# Patient Record
Sex: Female | Born: 1960 | Race: Black or African American | Hispanic: No | Marital: Single | Smoking: Current every day smoker
Health system: Southern US, Community
[De-identification: ages and names within clinical notes are randomized; demographics above are authoritative.]

## PROBLEM LIST (undated history)

## (undated) DIAGNOSIS — C801 Malignant (primary) neoplasm, unspecified: Secondary | ICD-10-CM

## (undated) DIAGNOSIS — K649 Unspecified hemorrhoids: Secondary | ICD-10-CM

## (undated) DIAGNOSIS — I10 Essential (primary) hypertension: Secondary | ICD-10-CM

## (undated) DIAGNOSIS — B20 Human immunodeficiency virus [HIV] disease: Secondary | ICD-10-CM

## (undated) HISTORY — PX: ABDOMINAL HYSTERECTOMY: SHX81

---

## 2006-12-14 ENCOUNTER — Encounter: Payer: Self-pay | Admitting: Internal Medicine

## 2006-12-15 ENCOUNTER — Encounter: Payer: Self-pay | Admitting: Internal Medicine

## 2006-12-29 ENCOUNTER — Encounter: Admission: RE | Admit: 2006-12-29 | Discharge: 2006-12-29 | Payer: Self-pay | Admitting: Internal Medicine

## 2006-12-29 ENCOUNTER — Ambulatory Visit: Payer: Self-pay | Admitting: Internal Medicine

## 2006-12-29 DIAGNOSIS — I10 Essential (primary) hypertension: Secondary | ICD-10-CM | POA: Insufficient documentation

## 2006-12-29 DIAGNOSIS — B20 Human immunodeficiency virus [HIV] disease: Secondary | ICD-10-CM

## 2006-12-29 LAB — CONVERTED CEMR LAB
ALT: 13 units/L (ref 0–35)
AST: 15 units/L (ref 0–37)
Albumin: 3.8 g/dL (ref 3.5–5.2)
Alkaline Phosphatase: 56 units/L (ref 39–117)
Basophils Absolute: 0 10*3/uL (ref 0.0–0.1)
Calcium: 9 mg/dL (ref 8.4–10.5)
Chlamydia, Swab/Urine, PCR: NEGATIVE
Cholesterol: 135 mg/dL (ref 0–200)
GC Probe Amp, Urine: NEGATIVE
HCV Ab: NEGATIVE
HIV-1 RNA Quant, Log: 3.47 — ABNORMAL HIGH (ref <1.70)
HIV-2 Ab: UNDETERMINED — AB
HIV: REACTIVE
Hemoglobin: 11.4 g/dL — ABNORMAL LOW (ref 12.0–15.0)
MCHC: 33.2 g/dL (ref 30.0–36.0)
MCV: 65.8 fL — ABNORMAL LOW (ref 78.0–100.0)
Monocytes Relative: 17 % — ABNORMAL HIGH (ref 3–11)
Neutrophils Relative %: 28 % — ABNORMAL LOW (ref 43–77)
Platelets: 405 10*3/uL — ABNORMAL HIGH (ref 150–400)
Potassium: 4 meq/L (ref 3.5–5.3)
Protein, ur: NEGATIVE mg/dL
RBC: 5.21 M/uL — ABNORMAL HIGH (ref 3.87–5.11)
RDW: 17.7 % — ABNORMAL HIGH (ref 11.5–14.0)
Sodium: 134 meq/L — ABNORMAL LOW (ref 135–145)
Specific Gravity, Urine: 1.014 (ref 1.005–1.03)
Total Bilirubin: 0.5 mg/dL (ref 0.3–1.2)
Total CHOL/HDL Ratio: 4
Total Protein: 7.5 g/dL (ref 6.0–8.3)
VLDL: 31 mg/dL (ref 0–40)
pH: 7.5 (ref 5.0–8.0)

## 2007-01-18 ENCOUNTER — Ambulatory Visit: Payer: Self-pay | Admitting: Internal Medicine

## 2007-01-18 DIAGNOSIS — K029 Dental caries, unspecified: Secondary | ICD-10-CM | POA: Insufficient documentation

## 2007-03-28 ENCOUNTER — Encounter: Payer: Self-pay | Admitting: Internal Medicine

## 2007-04-03 ENCOUNTER — Encounter (INDEPENDENT_AMBULATORY_CARE_PROVIDER_SITE_OTHER): Payer: Self-pay | Admitting: *Deleted

## 2007-04-17 ENCOUNTER — Encounter (INDEPENDENT_AMBULATORY_CARE_PROVIDER_SITE_OTHER): Payer: Self-pay | Admitting: *Deleted

## 2007-05-02 ENCOUNTER — Telehealth: Payer: Self-pay | Admitting: Internal Medicine

## 2007-05-05 ENCOUNTER — Encounter: Payer: Self-pay | Admitting: Internal Medicine

## 2007-05-05 ENCOUNTER — Ambulatory Visit: Payer: Self-pay | Admitting: Internal Medicine

## 2007-05-05 ENCOUNTER — Encounter: Admission: RE | Admit: 2007-05-05 | Discharge: 2007-05-05 | Payer: Self-pay | Admitting: Internal Medicine

## 2007-05-05 LAB — CONVERTED CEMR LAB
ALT: 11 units/L (ref 0–35)
Albumin: 4.1 g/dL (ref 3.5–5.2)
Alkaline Phosphatase: 61 units/L (ref 39–117)
Basophils Absolute: 0 10*3/uL (ref 0.0–0.1)
Calcium: 9.2 mg/dL (ref 8.4–10.5)
Eosinophils Relative: 1 % (ref 0–5)
HCT: 33.7 % — ABNORMAL LOW (ref 36.0–46.0)
HIV 1 RNA Quant: 6950 copies/mL — ABNORMAL HIGH (ref <50)
Hemoglobin: 10.7 g/dL — ABNORMAL LOW (ref 12.0–15.0)
Lymphocytes Relative: 48 % — ABNORMAL HIGH (ref 12–46)
Lymphs Abs: 2 10*3/uL (ref 0.7–4.0)
MCHC: 31.8 g/dL (ref 30.0–36.0)
Monocytes Relative: 17 % — ABNORMAL HIGH (ref 3–12)
Neutrophils Relative %: 34 % — ABNORMAL LOW (ref 43–77)
Platelets: 458 10*3/uL — ABNORMAL HIGH (ref 150–400)
Potassium: 4.1 meq/L (ref 3.5–5.3)
RBC: 5.12 M/uL — ABNORMAL HIGH (ref 3.87–5.11)
RDW: 17.6 % — ABNORMAL HIGH (ref 11.5–15.5)
Sodium: 137 meq/L (ref 135–145)
Total Bilirubin: 0.4 mg/dL (ref 0.3–1.2)
WBC: 4.1 10*3/uL (ref 4.0–10.5)

## 2007-05-08 ENCOUNTER — Encounter: Payer: Self-pay | Admitting: Internal Medicine

## 2007-05-08 LAB — CONVERTED CEMR LAB: Pap Smear: NORMAL

## 2007-05-11 ENCOUNTER — Encounter: Payer: Self-pay | Admitting: Internal Medicine

## 2007-05-17 ENCOUNTER — Encounter (INDEPENDENT_AMBULATORY_CARE_PROVIDER_SITE_OTHER): Payer: Self-pay | Admitting: *Deleted

## 2007-05-26 ENCOUNTER — Ambulatory Visit: Payer: Self-pay | Admitting: Internal Medicine

## 2007-07-25 ENCOUNTER — Telehealth: Payer: Self-pay

## 2007-08-25 ENCOUNTER — Ambulatory Visit: Payer: Self-pay | Admitting: Internal Medicine

## 2007-08-25 ENCOUNTER — Encounter: Admission: RE | Admit: 2007-08-25 | Discharge: 2007-08-25 | Payer: Self-pay | Admitting: Internal Medicine

## 2007-08-25 LAB — CONVERTED CEMR LAB
AST: 16 units/L (ref 0–37)
BUN: 14 mg/dL (ref 6–23)
Basophils Relative: 0 % (ref 0–1)
CO2: 22 meq/L (ref 19–32)
Calcium: 9.2 mg/dL (ref 8.4–10.5)
Chloride: 106 meq/L (ref 96–112)
Creatinine, Ser: 0.73 mg/dL (ref 0.40–1.20)
HCT: 33 % — ABNORMAL LOW (ref 36.0–46.0)
HIV 1 RNA Quant: 3230 copies/mL — ABNORMAL HIGH (ref <50)
Hemoglobin: 10 g/dL — ABNORMAL LOW (ref 12.0–15.0)
MCHC: 30.3 g/dL (ref 30.0–36.0)
Monocytes Absolute: 0.8 10*3/uL (ref 0.1–1.0)
Monocytes Relative: 16 % — ABNORMAL HIGH (ref 3–12)
Neutro Abs: 1.8 10*3/uL (ref 1.7–7.7)
RBC: 5.21 M/uL — ABNORMAL HIGH (ref 3.87–5.11)

## 2008-01-02 ENCOUNTER — Encounter: Payer: Self-pay | Admitting: Internal Medicine

## 2008-01-04 ENCOUNTER — Ambulatory Visit: Payer: Self-pay | Admitting: Internal Medicine

## 2008-01-04 LAB — CONVERTED CEMR LAB: HIV-1 RNA Quant, Log: 3.59 — ABNORMAL HIGH (ref <1.70)

## 2008-01-05 LAB — CONVERTED CEMR LAB
AST: 12 units/L (ref 0–37)
Albumin: 3.8 g/dL (ref 3.5–5.2)
BUN: 14 mg/dL (ref 6–23)
Calcium: 8.9 mg/dL (ref 8.4–10.5)
Chloride: 104 meq/L (ref 96–112)
Glucose, Bld: 85 mg/dL (ref 70–99)
HDL: 33 mg/dL — ABNORMAL LOW (ref >39)
Lymphocytes Relative: 44 % (ref 12–46)
Lymphs Abs: 2.5 10*3/uL (ref 0.7–4.0)
Monocytes Relative: 18 % — ABNORMAL HIGH (ref 3–12)
Neutro Abs: 2 10*3/uL (ref 1.7–7.7)
Neutrophils Relative %: 36 % — ABNORMAL LOW (ref 43–77)
Potassium: 4.4 meq/L (ref 3.5–5.3)
RBC: 5.06 M/uL (ref 3.87–5.11)
Triglycerides: 132 mg/dL (ref <150)
WBC: 5.6 10*3/uL (ref 4.0–10.5)

## 2008-01-08 ENCOUNTER — Telehealth: Payer: Self-pay | Admitting: Internal Medicine

## 2008-01-26 ENCOUNTER — Ambulatory Visit: Payer: Self-pay | Admitting: Internal Medicine

## 2008-01-26 DIAGNOSIS — D500 Iron deficiency anemia secondary to blood loss (chronic): Secondary | ICD-10-CM | POA: Insufficient documentation

## 2008-01-26 DIAGNOSIS — K047 Periapical abscess without sinus: Secondary | ICD-10-CM | POA: Insufficient documentation

## 2008-06-19 ENCOUNTER — Ambulatory Visit: Payer: Self-pay | Admitting: Internal Medicine

## 2008-06-19 ENCOUNTER — Encounter: Payer: Self-pay | Admitting: Licensed Clinical Social Worker

## 2008-06-19 LAB — CONVERTED CEMR LAB
ALT: 10 units/L (ref 0–35)
AST: 15 units/L (ref 0–37)
Albumin: 4.1 g/dL (ref 3.5–5.2)
CO2: 22 meq/L (ref 19–32)
Calcium: 9.2 mg/dL (ref 8.4–10.5)
Chloride: 103 meq/L (ref 96–112)
Creatinine, Ser: 0.6 mg/dL (ref 0.40–1.20)
Eosinophils Absolute: 0.1 10*3/uL (ref 0.0–0.7)
HIV 1 RNA Quant: 6690 copies/mL — ABNORMAL HIGH (ref <48)
Lymphocytes Relative: 53 % — ABNORMAL HIGH (ref 12–46)
Lymphs Abs: 2.1 10*3/uL (ref 0.7–4.0)
MCV: 61.8 fL — ABNORMAL LOW (ref 78.0–100.0)
Monocytes Relative: 19 % — ABNORMAL HIGH (ref 3–12)
Neutrophils Relative %: 25 % — ABNORMAL LOW (ref 43–77)
Potassium: 3.9 meq/L (ref 3.5–5.3)
RBC: 5.47 M/uL — ABNORMAL HIGH (ref 3.87–5.11)
Sodium: 135 meq/L (ref 135–145)
Total Protein: 7.9 g/dL (ref 6.0–8.3)
WBC: 4.1 10*3/uL (ref 4.0–10.5)

## 2008-07-24 ENCOUNTER — Ambulatory Visit: Payer: Self-pay | Admitting: Internal Medicine

## 2008-07-24 DIAGNOSIS — M2550 Pain in unspecified joint: Secondary | ICD-10-CM

## 2008-07-24 DIAGNOSIS — T7411XA Adult physical abuse, confirmed, initial encounter: Secondary | ICD-10-CM | POA: Insufficient documentation

## 2008-07-29 ENCOUNTER — Encounter: Payer: Self-pay | Admitting: Licensed Clinical Social Worker

## 2008-08-08 ENCOUNTER — Encounter: Payer: Self-pay | Admitting: Internal Medicine

## 2009-07-02 ENCOUNTER — Telehealth: Payer: Self-pay | Admitting: Internal Medicine

## 2009-08-17 ENCOUNTER — Encounter: Payer: Self-pay | Admitting: Internal Medicine

## 2009-08-18 ENCOUNTER — Encounter: Payer: Self-pay | Admitting: Internal Medicine

## 2009-08-19 ENCOUNTER — Ambulatory Visit: Payer: Self-pay | Admitting: Internal Medicine

## 2009-08-19 LAB — CONVERTED CEMR LAB
ALT: 10 units/L (ref 0–35)
Alkaline Phosphatase: 70 units/L (ref 39–117)
Basophils Absolute: 0 10*3/uL (ref 0.0–0.1)
Basophils Relative: 0 % (ref 0–1)
CO2: 23 meq/L (ref 19–32)
Cholesterol: 158 mg/dL (ref 0–200)
Creatinine, Ser: 0.55 mg/dL (ref 0.40–1.20)
Eosinophils Absolute: 0 10*3/uL (ref 0.0–0.7)
HIV-1 RNA Quant, Log: 4.53 — ABNORMAL HIGH (ref <1.68)
LDL Cholesterol: 98 mg/dL (ref 0–99)
MCHC: 28.9 g/dL — ABNORMAL LOW (ref 30.0–36.0)
MCV: 63.6 fL — ABNORMAL LOW (ref 78.0–100.0)
Neutro Abs: 1.5 10*3/uL — ABNORMAL LOW (ref 1.7–7.7)
Neutrophils Relative %: 34 % — ABNORMAL LOW (ref 43–77)
Platelets: 466 10*3/uL — ABNORMAL HIGH (ref 150–400)
RDW: 25.5 % — ABNORMAL HIGH (ref 11.5–15.5)
Sodium: 137 meq/L (ref 135–145)
Total Bilirubin: 0.4 mg/dL (ref 0.3–1.2)
Total CHOL/HDL Ratio: 4.4
Total Protein: 8 g/dL (ref 6.0–8.3)
Triglycerides: 119 mg/dL (ref <150)
VLDL: 24 mg/dL (ref 0–40)

## 2009-09-08 ENCOUNTER — Ambulatory Visit: Payer: Self-pay | Admitting: Radiology

## 2009-09-08 ENCOUNTER — Emergency Department (HOSPITAL_BASED_OUTPATIENT_CLINIC_OR_DEPARTMENT_OTHER): Admission: EM | Admit: 2009-09-08 | Discharge: 2009-09-08 | Payer: Self-pay | Admitting: Emergency Medicine

## 2009-10-02 ENCOUNTER — Ambulatory Visit: Payer: Self-pay | Admitting: Internal Medicine

## 2009-10-02 DIAGNOSIS — N920 Excessive and frequent menstruation with regular cycle: Secondary | ICD-10-CM

## 2009-10-02 LAB — CONVERTED CEMR LAB: HIV-1 RNA Quant, Log: 4.42 — ABNORMAL HIGH (ref <1.68)

## 2009-10-15 ENCOUNTER — Encounter: Payer: Self-pay | Admitting: Internal Medicine

## 2009-10-31 ENCOUNTER — Ambulatory Visit: Payer: Self-pay | Admitting: Internal Medicine

## 2009-10-31 ENCOUNTER — Encounter (INDEPENDENT_AMBULATORY_CARE_PROVIDER_SITE_OTHER): Payer: Self-pay | Admitting: *Deleted

## 2009-10-31 ENCOUNTER — Encounter: Payer: Self-pay | Admitting: Infectious Disease

## 2009-11-01 LAB — CONVERTED CEMR LAB
HCT: 21.5 % — ABNORMAL LOW (ref 36.0–46.0)
Hemoglobin: 6.1 g/dL — CL (ref 12.0–15.0)
MCV: 62.3 fL — ABNORMAL LOW (ref 78.0–100.0)
Platelets: 428 10*3/uL — ABNORMAL HIGH (ref 150–400)
WBC: 4.2 10*3/uL (ref 4.0–10.5)

## 2009-11-05 ENCOUNTER — Telehealth (INDEPENDENT_AMBULATORY_CARE_PROVIDER_SITE_OTHER): Payer: Self-pay | Admitting: *Deleted

## 2009-11-14 ENCOUNTER — Encounter: Payer: Self-pay | Admitting: Internal Medicine

## 2009-11-27 ENCOUNTER — Encounter (INDEPENDENT_AMBULATORY_CARE_PROVIDER_SITE_OTHER): Payer: Self-pay | Admitting: Family Medicine

## 2009-11-27 ENCOUNTER — Ambulatory Visit: Payer: Self-pay | Admitting: Obstetrics and Gynecology

## 2009-11-27 LAB — CONVERTED CEMR LAB
HCT: 28.4 % — ABNORMAL LOW (ref 36.0–46.0)
Hemoglobin: 8.4 g/dL — ABNORMAL LOW (ref 12.0–15.0)
MCHC: 29.6 g/dL — ABNORMAL LOW (ref 30.0–36.0)
MCV: 65.6 fL — ABNORMAL LOW (ref 78.0–100.0)
Platelets: 323 10*3/uL (ref 150–400)
RDW: 25.2 % — ABNORMAL HIGH (ref 11.5–15.5)

## 2009-12-11 ENCOUNTER — Ambulatory Visit (HOSPITAL_COMMUNITY): Admission: RE | Admit: 2009-12-11 | Discharge: 2009-12-11 | Payer: Self-pay | Admitting: Family Medicine

## 2009-12-15 ENCOUNTER — Encounter: Payer: Self-pay | Admitting: Internal Medicine

## 2009-12-22 ENCOUNTER — Encounter (INDEPENDENT_AMBULATORY_CARE_PROVIDER_SITE_OTHER): Payer: Self-pay | Admitting: *Deleted

## 2009-12-31 ENCOUNTER — Ambulatory Visit: Payer: Self-pay | Admitting: Obstetrics and Gynecology

## 2010-01-02 ENCOUNTER — Encounter (INDEPENDENT_AMBULATORY_CARE_PROVIDER_SITE_OTHER): Payer: Self-pay | Admitting: *Deleted

## 2010-01-15 ENCOUNTER — Encounter (INDEPENDENT_AMBULATORY_CARE_PROVIDER_SITE_OTHER): Payer: Self-pay | Admitting: *Deleted

## 2010-01-20 ENCOUNTER — Other Ambulatory Visit: Payer: Self-pay | Admitting: Obstetrics and Gynecology

## 2010-01-23 ENCOUNTER — Encounter (INDEPENDENT_AMBULATORY_CARE_PROVIDER_SITE_OTHER): Payer: Self-pay | Admitting: *Deleted

## 2010-01-27 ENCOUNTER — Other Ambulatory Visit: Payer: Self-pay | Admitting: Obstetrics and Gynecology

## 2010-01-27 ENCOUNTER — Ambulatory Visit: Payer: Self-pay | Admitting: Obstetrics and Gynecology

## 2010-01-27 ENCOUNTER — Encounter: Payer: Self-pay | Admitting: Obstetrics & Gynecology

## 2010-01-27 ENCOUNTER — Inpatient Hospital Stay (HOSPITAL_COMMUNITY): Admission: AD | Admit: 2010-01-27 | Discharge: 2010-01-29 | Payer: Self-pay | Admitting: Obstetrics & Gynecology

## 2010-02-02 ENCOUNTER — Ambulatory Visit: Payer: Self-pay | Admitting: Family Medicine

## 2010-02-09 ENCOUNTER — Ambulatory Visit: Payer: Self-pay | Admitting: Obstetrics and Gynecology

## 2010-02-16 ENCOUNTER — Ambulatory Visit: Payer: Self-pay | Admitting: Obstetrics and Gynecology

## 2010-03-02 ENCOUNTER — Encounter (INDEPENDENT_AMBULATORY_CARE_PROVIDER_SITE_OTHER): Payer: Self-pay | Admitting: *Deleted

## 2010-03-02 ENCOUNTER — Ambulatory Visit: Payer: Self-pay | Admitting: Obstetrics & Gynecology

## 2010-03-25 ENCOUNTER — Ambulatory Visit: Payer: Self-pay | Admitting: Obstetrics & Gynecology

## 2010-04-26 ENCOUNTER — Encounter: Payer: Self-pay | Admitting: Internal Medicine

## 2010-05-05 NOTE — Miscellaneous (Signed)
  Clinical Lists Changes  lient connected with [facility] since 10/02/09. Client approved and received orange card 12/11/09 via Rudell Cobb.  BC transported client to [facility], client received ultrasound.  Client follow-up appt for ultrasound is scheduled for 12/31/09 @ 2:45. BC will be discharging client from [facility] after 12/31/09 appt.  BC will connect client to [facility] in order to get bus tickets in order to get to future medical appts.

## 2010-05-05 NOTE — Miscellaneous (Signed)
Summary: [place]  DDS  [place]  DDS   Imported By: Florinda Marker 10/15/2009 10:04:07  _____________________________________________________________________  External Attachment:    Type:   Image     Comment:   External Document
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
## 2010-05-05 NOTE — Miscellaneous (Signed)
Summary: clinical update/ryan white NCADAP app completed  Clinical Lists Changes  Observations: Added new observation of AIDSDAP: Pending (10/31/2009 10:22) Added new observation of FINASSESSDT: 10/31/2009 (10/31/2009 10:22) Added new observation of CASE MGM: Tonja/Tammy (10/31/2009 10:22) Added new observation of RW VITAL STA: Active (10/31/2009 10:22)

## 2010-05-05 NOTE — Miscellaneous (Signed)
Summary: [place] DDS  [place] DDS   Imported By: Florinda Marker 09/09/2009 16:15:02  _____________________________________________________________________  External Attachment:    Type:   Image     Comment:   External Document

## 2010-05-05 NOTE — Assessment & Plan Note (Signed)
Summary: F/U [MKJ]   CC:  follow-up visit, lab results, and c/o heavy bleeding.  History of Present Illness: Pt continues to have menstrual bleeding.  She has been bleeding since June.  She was unable to see Gynecology due to her lack of insurance. She is ready to start HIV medication.  Preventive Screening-Counseling & Management  Alcohol-Tobacco     Alcohol drinks/day: 8 per day     Alcohol type: beer     Smoking Status: current     Packs/Day: 0.75  Caffeine-Diet-Exercise     Caffeine use/day: coffee, tea, soda 6 per day     Does Patient Exercise: yes     Type of exercise: walking     Exercise (avg: min/session): 30-60     Times/week: 7  Safety-Violence-Falls     Seat Belt Use: yes      Sexual History:  n/a.        Drug Use:  current.        Blood Transfusions:  no.        Travel History:  none.    Comments: pt. given condoms   Current Allergies (reviewed today): No known allergies  Review of Systems       The patient complains of abdominal pain.  The patient denies anorexia, fever, weight loss, melena, and hematochezia.    Vital Signs:  Patient profile:   50 year old female Menstrual status:  regular Height:      65 inches (165.10 cm) Weight:      197.4 pounds (89.73 kg) BMI:     32.97 Temp:     98.2 degrees F (36.78 degrees C) oral Pulse rate:   77 / minute BP sitting:   147 / 81  (right arm)  Vitals Entered By: Wendall Mola CMA Duncan Dull) (October 31, 2009 9:10 AM) CC: follow-up visit, lab results, c/o heavy bleeding Is Patient Diabetic? No Pain Assessment Patient in pain? yes     Location: pelvic Intensity: 7 Type: aching Onset of pain  Constant Nutritional Status BMI of > 30 = obese Nutritional Status Detail appetite "up and down"  Does patient need assistance? Functional Status Self care Ambulation Normal Comments Pt.needs to get discount card so she can be referred for hysterectomy   Physical Exam  General:  alert,  well-developed, well-nourished, and well-hydrated.   Head:  normocephalic and atraumatic.   Mouth:  pharynx pink and moist.   Lungs:  normal breath sounds.      Impression & Recommendations:  Problem # 1:  HIV INFECTION, PRIMARY (ICD-042) Discussed treatment options.  Pt would like to start Atripla.  Potential side effects discussed.  I will refer her to [facility] Hesselbach to get enrolled in ADAP.  She will return in 6 weeks for labs. Diagnostics Reviewed:  HIV: REACTIVE (12/29/2006)   HIV-Western blot: Positive (12/29/2006)   CD4: 440 (08/20/2009)   WBC: 4.3 (08/19/2009)   Hgb: 9.1 (08/19/2009)   HCT: 31.5 (08/19/2009)   Platelets: 466 (08/19/2009) HIV genotype: See Comment (10/02/2009)   HIV-1 RNA: 01027 (10/02/2009)   HBSAg: NEG (12/29/2006)  Orders: Est. Patient Level III (99213)Future Orders: T-CD4SP ([facility]) (CD4SP) ... 12/12/2009 T-HIV Viral Load [phone]) ... 12/12/2009 T-Comprehensive Metabolic Panel [phone]) ... 12/12/2009 T-CBC w/Diff ([id number]) ... 12/12/2009  Problem # 2:  EXCESSIVE MENSTRUAL BLEEDING (ICD-626.2) refer to Gynecology stat CBC today Orders: T-CBC No Diff ([id number])  Medications Added to Medication List This Visit: 1)  Atripla 600-200-300 Mg Tabs (Efavirenz-emtricitab-tenofovir) .... Take 1 tablet by mouth  at bedtime  Patient Instructions: 1)  Please schedule a follow-up appointment in 8 weeks, 2 weeks after labs.

## 2010-05-05 NOTE — Miscellaneous (Signed)
Summary: Orders Update  Clinical Lists Changes  Problems: Added new problem of ENCOUNTER FOR LONG-TERM USE OF OTHER MEDICATIONS (ICD-V58.69) Orders: Added new Test order of T-Chlamydia  Probe, urine [phone]) - Signed Added new Test order of T-CBC w/Diff [id number]) - Signed Added new Test order of T-CD4SP [facility]) (CD4SP) - Signed Added new Test order of T-GC Probe, urine [id number]) - Signed Added new Test order of T-Comprehensive Metabolic Panel [phone]) - Signed Added new Test order of T-HIV Viral Load [phone]) - Signed Added new Test order of T-RPR (Syphilis) ([id number]) - Signed Added new Test order of T-Lipid Profile ([id number]) - Signed Added new Test order of T-Urinalysis ([id number]) - Signed     Process Orders Not all tests in this order have been signed Check Orders Results:     [facility]: Order checked:       NOT AUTHORIZED TO ORDER Tests Sent for requisitioning (Aug 18, 2009 4:29 PM):     08/18/2009: [facility] -- T-Chlamydia  Probe, urine [phone] (unsigned)     08/18/2009: [facility] -- T-CBC w/Diff [[id number]] (unsigned)     08/18/2009: [facility] -- T-GC Probe, urine [phone] (unsigned)     08/18/2009: [facility] -- T-Comprehensive Metabolic Panel [[id number]] (unsigned)     08/18/2009: [facility] -- T-HIV Viral Load [phone] (unsigned)     08/18/2009: [facility] -- T-RPR (Syphilis) [phone] (unsigned)     08/18/2009: [facility] -- T-Lipid Profile [[id number]] (unsigned)     08/18/2009: [facility] -- T-Urinalysis [[id number]] (unsigned)   Appended Document: Orders Update urine tests not done, pt. unable to void

## 2010-05-05 NOTE — Progress Notes (Signed)
Summary: appt.at [facility]  Phone Note Outgoing Call   Call placed by: Annice Pih Action Taken: Appt scheduled Summary of Call: Called pt. to notify of appt. at [facility]. scheduled for 11/27/09 at 1:45 pm, arrive 10 minutes early Initial call taken by: Wendall Mola CMA Duncan Dull),  November 05, 2009 3:38 PM

## 2010-05-05 NOTE — Miscellaneous (Signed)
Summary: [facility]  Clinical Lists Changes [facility] transported client to her scheduled appt with [facility] 12/31/09.  Client was informed that her surgery for to receive an hysterectomy will be scheduled between Oct/Nov 2011.  Client frustrated because she prefers to have to surgery like "today" because she is tired of the bleeding.  Client states that she has been bleeding every day since [facility].  BC informed client that this will be BC's last time transporting client to any of her appts and reminded client that she can reconnect with [facility] in [facility] in order to get bus tickets to get to her future scheduled medical appts.  BC encouraged client to keep scheduled medical medical from this point on and wished her the best of luck with her future endeavors.  BC discharging client from the [facility].

## 2010-05-05 NOTE — Letter (Signed)
Summary: Juanell Fairly Application  Ryan White Application   Imported By: Florinda Marker 01/09/2010 16:37:03  _____________________________________________________________________  External Attachment:    Type:   Image     Comment:   External Document

## 2010-05-05 NOTE — Miscellaneous (Signed)
Summary: [facility]  Clinical Lists Changes  C transported client to [facility] clinic appt scheduled for 10/18 @ 11:30.  Client will be having surgery 01/27/10.  Client will need for [facility] to transport her Monday evening to [facility] so that she can have a blood transfusion before her surgery. Client will stay in the hospital after [facility] transport client Monday evening until some days after her surgery.

## 2010-05-05 NOTE — Progress Notes (Signed)
Summary: problems with bp  Phone Note Call from Patient   Caller: Patient Summary of Call: Patient called stating that she suspects her bp is high, she doesn't have a monitor. Her vision is blurry, and she has some dizzyness, no ha, of numbness. She says this happens when her bp goes up. She states that she has to take 2 pills to keep her bp down, instead of 1 pill as directed. Since the office is closed to patients this week what do you suggest? Initial call taken by: Starleen Arms CMA,  July 02, 2009 4:49 PM  Follow-up for Phone Call        urgent care Follow-up by: Yisroel Ramming MD,  July 03, 2009 11:12 AM  Additional Follow-up for Phone Call Additional follow up Details #1::        ok patient was told Additional Follow-up by: Starleen Arms CMA,  July 03, 2009 12:26 PM

## 2010-05-05 NOTE — Miscellaneous (Signed)
Summary: [employer] Updated - Paulo Fruit  Clinical Lists Changes

## 2010-05-05 NOTE — Miscellaneous (Signed)
Summary: [facility]  Clinical Lists Changes 01/14/10-Client phone [facility] requesting transportation to her [facility] appts scheduled for 01/20/10 @ 11:30 and 01/27/10 @ 8:30.  Client is not familiar with taking the bus to [facility] and has no other means of transportation.  Client states that she above appts are right before her sugery that is scheduled for the end of Oct or early Nov 2011.  [facility] agreed to transport client to [facility] for just those above scheduled appts though client is closed.  Client states that she can not wait until she has her surgery (hysterectomy) because she is tired of bleeding all of the time.

## 2010-05-05 NOTE — Miscellaneous (Signed)
Summary: clinical update/ryan white nCADaP apprvoved til 07/04/10  Clinical Lists Changes  Medications: Rx of ATRIPLA 600-200-300 MG TABS (EFAVIRENZ-EMTRICITAB-TENOFOVIR) Take 1 tablet by mouth at bedtime;  #30 x 5;  Signed;  Entered by: Paulo Fruit  BS,CPht II,MPH;  Authorized by: Yisroel Ramming MD;  Method used: Electronically to [facility] [phone]*, [address], [place], [place]  [address], Ph: [phone], Fax: Observations: Added new observation of AIDSDAP: Yes 2011 (11/14/2009 11:00)    Prescriptions: ATRIPLA 600-200-300 MG TABS (EFAVIRENZ-EMTRICITAB-TENOFOVIR) Take 1 tablet by mouth at bedtime  #30 x 5   Entered by:   Paulo Fruit  BS,CPht II,MPH   Authorized by:   Yisroel Ramming MD   Signed by:   Paulo Fruit  BS,CPht II,MPH on 11/14/2009   Method used:   Electronically to        [facility] [phone]* (retail)       [address]       [place], [place]  [address]       Ph: [phone]       Fax:    RxID:   [id number]  Paulo Fruit  BS,CPht II,MPH  November 14, 2009 11:01 AM

## 2010-06-16 ENCOUNTER — Encounter (INDEPENDENT_AMBULATORY_CARE_PROVIDER_SITE_OTHER): Payer: Self-pay | Admitting: *Deleted

## 2010-06-17 LAB — CROSSMATCH
Antibody Screen: NEGATIVE
Unit division: 0

## 2010-06-17 LAB — CBC
HCT: 24.8 % — ABNORMAL LOW (ref 36.0–46.0)
HCT: 25 % — ABNORMAL LOW (ref 36.0–46.0)
Hemoglobin: 8.1 g/dL — ABNORMAL LOW (ref 12.0–15.0)
Hemoglobin: 9.1 g/dL — ABNORMAL LOW (ref 12.0–15.0)
MCH: 18.4 pg — ABNORMAL LOW (ref 26.0–34.0)
MCH: 21.2 pg — ABNORMAL LOW (ref 26.0–34.0)
MCHC: 30.4 g/dL (ref 30.0–36.0)
MCV: 60.3 fL — ABNORMAL LOW (ref 78.0–100.0)
MCV: 65.9 fL — ABNORMAL LOW (ref 78.0–100.0)
MCV: 66.6 fL — ABNORMAL LOW (ref 78.0–100.0)
Platelets: 420 10*3/uL — ABNORMAL HIGH (ref 150–400)
Platelets: 464 10*3/uL — ABNORMAL HIGH (ref 150–400)
Platelets: 469 10*3/uL — ABNORMAL HIGH (ref 150–400)
RBC: 3.79 MIL/uL — ABNORMAL LOW (ref 3.87–5.11)
RBC: 4.29 MIL/uL (ref 3.87–5.11)
RDW: 21.2 % — ABNORMAL HIGH (ref 11.5–15.5)
WBC: 7 10*3/uL (ref 4.0–10.5)
WBC: 9 10*3/uL (ref 4.0–10.5)

## 2010-06-22 LAB — CBC
HCT: 29.8 % — ABNORMAL LOW (ref 36.0–46.0)
MCHC: 31.8 g/dL (ref 30.0–36.0)
MCV: 63.3 fL — ABNORMAL LOW (ref 78.0–100.0)
Platelets: 372 10*3/uL (ref 150–400)
RDW: 23.5 % — ABNORMAL HIGH (ref 11.5–15.5)

## 2010-06-22 LAB — DIFFERENTIAL
Basophils Absolute: 0 10*3/uL (ref 0.0–0.1)
Lymphs Abs: 1.8 10*3/uL (ref 0.7–4.0)
Monocytes Absolute: 0.6 10*3/uL (ref 0.1–1.0)
Monocytes Relative: 15 % — ABNORMAL HIGH (ref 3–12)
Neutro Abs: 1.8 10*3/uL (ref 1.7–7.7)

## 2010-06-22 LAB — BASIC METABOLIC PANEL
BUN: 14 mg/dL (ref 6–23)
CO2: 27 mEq/L (ref 19–32)
Chloride: 106 mEq/L (ref 96–112)
Creatinine, Ser: 0.6 mg/dL (ref 0.4–1.2)
Glucose, Bld: 92 mg/dL (ref 70–99)
Potassium: 4.3 mEq/L (ref 3.5–5.1)

## 2010-06-22 LAB — T-HELPER CELL (CD4) - (RCID CLINIC ONLY): CD4 T Cell Abs: 440 uL (ref 400–2700)

## 2010-06-23 NOTE — Miscellaneous (Signed)
Summary: ADAP approved til 01/03/11  Clinical Lists Changes  Observations: Added new observation of PCTFPL: 0  (06/16/2010 9:18) Added new observation of AIDSDAP: Yes 2012  (06/16/2010 9:18) Added new observation of FINASSESSDT: 05/26/2010  (06/16/2010 9:18)

## 2010-06-24 ENCOUNTER — Ambulatory Visit: Payer: Self-pay | Admitting: Obstetrics & Gynecology

## 2010-07-16 LAB — T-HELPER CELL (CD4) - (RCID CLINIC ONLY): CD4 % Helper T Cell: 34 % (ref 33–55)

## 2010-08-18 NOTE — Assessment & Plan Note (Signed)
NAME:  Kristen Roman, Kristen Roman NO.:  [id number]   MEDICAL RECORD NO.:  [id number]          PATIENT TYPE:  WOC   LOCATION:  [facility] at [facility]         FACILITY:  [facility]   PHYSICIAN:  Caren Griffins, CNM       DATE OF BIRTH:  1960-05-24   DATE OF SERVICE:                                  CLINIC NOTE   REASON FOR VISIT:  Wound drainage.   HISTORY:  Kristen Roman is back for a recheck.  She is now over 4 weeks  postop TAH-BSO, had a Pfannenstiel incision and has had postoperative  complications of cellulitis.  She completed a course of Septra February 09, 2010, through February 19, 2010.  She has run out of her ibuprofen  and needs that saying the pain is worsen it has been.  She particularly  has an area of discomfort on the left lower abdomen above left margin of  the incision.  She is also tender in mid incisional area.  She has been  cleaning with peroxide and using peri-pads to absorb the yellow green  drainage and states this has increased in amount from her last visit.   OBJECTIVE:  VITAL SIGNS:  98.1, BP elevated 161/97, 140/80 recheck,  weight 192.  ABDOMEN:  Soft and nondistended.  She has mild-to-moderate tenderness  and a fairly circumscribed area about 2 cm above the left wound margin.  Incision is draining centrally, yellow pus expressed.  The Q-tip probed  about 3 cm particularly to the right made an attempt to pack it with  gauze, however, she could not tolerate the pain.  Instead we are giving  her Apron dressings and saline gauze supplies for her to keep the area  clean.  We will treat her again for wound infection, consult with Dr.  Macon Large we will use Bactrim DS and see her back in 3 days at [facility]  and also refill her ibuprofen 800 one p.o. q.8 hours.  She is advised to  take an ibuprofen before coming to her next visit as we may need to  reattempt packing the draining area.  She is also advised to change the  dressing any time and saturate it and at  least once a day until she  returns.           ______________________________  Caren Griffins, CNM     DP/MEDQ  D:  03/02/2010  T:  03/03/2010  Job:  [id number]

## 2010-08-24 ENCOUNTER — Other Ambulatory Visit: Payer: Self-pay

## 2010-09-10 ENCOUNTER — Ambulatory Visit: Payer: Self-pay | Admitting: Internal Medicine

## 2010-09-21 ENCOUNTER — Ambulatory Visit: Payer: Self-pay | Admitting: Obstetrics & Gynecology

## 2010-11-09 ENCOUNTER — Other Ambulatory Visit: Payer: Self-pay | Admitting: *Deleted

## 2010-11-09 DIAGNOSIS — I10 Essential (primary) hypertension: Secondary | ICD-10-CM

## 2010-11-09 MED ORDER — ATENOLOL 50 MG PO TABS: 50.0000 mg | ORAL_TABLET | Freq: Every day | ORAL | Status: DC

## 2010-11-17 ENCOUNTER — Other Ambulatory Visit: Payer: Self-pay

## 2010-12-04 ENCOUNTER — Ambulatory Visit: Payer: Self-pay | Admitting: Internal Medicine

## 2010-12-04 ENCOUNTER — Other Ambulatory Visit: Payer: Self-pay | Admitting: *Deleted

## 2010-12-04 DIAGNOSIS — I10 Essential (primary) hypertension: Secondary | ICD-10-CM

## 2010-12-04 MED ORDER — ATENOLOL 50 MG PO TABS: 50.0000 mg | ORAL_TABLET | Freq: Every day | ORAL | Status: DC

## 2010-12-10 ENCOUNTER — Other Ambulatory Visit: Payer: Self-pay

## 2010-12-24 ENCOUNTER — Ambulatory Visit: Payer: Self-pay | Admitting: Internal Medicine

## 2010-12-24 ENCOUNTER — Telehealth: Payer: Self-pay | Admitting: *Deleted

## 2010-12-24 NOTE — Telephone Encounter (Signed)
Referred her to [facility]. Hx of no shows. She will attempt to get in touch with her & get her in for an appt

## 2010-12-30 LAB — T-HELPER CELL (CD4) - (RCID CLINIC ONLY)
CD4 % Helper T Cell: 35
CD4 T Cell Abs: 770

## 2011-01-04 LAB — T-HELPER CELL (CD4) - (RCID CLINIC ONLY): CD4 T Cell Abs: 640

## 2011-01-14 ENCOUNTER — Other Ambulatory Visit: Payer: Self-pay

## 2011-01-14 LAB — T-HELPER CELL (CD4) - (RCID CLINIC ONLY)
CD4 % Helper T Cell: 30 — ABNORMAL LOW
CD4 T Cell Abs: 650

## 2011-01-21 ENCOUNTER — Other Ambulatory Visit (INDEPENDENT_AMBULATORY_CARE_PROVIDER_SITE_OTHER): Payer: Self-pay

## 2011-01-21 ENCOUNTER — Other Ambulatory Visit: Payer: Self-pay | Admitting: *Deleted

## 2011-01-21 DIAGNOSIS — B20 Human immunodeficiency virus [HIV] disease: Secondary | ICD-10-CM

## 2011-01-21 DIAGNOSIS — Z79899 Other long term (current) drug therapy: Secondary | ICD-10-CM

## 2011-01-21 DIAGNOSIS — I10 Essential (primary) hypertension: Secondary | ICD-10-CM

## 2011-01-21 DIAGNOSIS — Z113 Encounter for screening for infections with a predominantly sexual mode of transmission: Secondary | ICD-10-CM

## 2011-01-21 LAB — COMPREHENSIVE METABOLIC PANEL
AST: 23 U/L (ref 0–37)
BUN: 10 mg/dL (ref 6–23)
CO2: 24 mEq/L (ref 19–32)
Calcium: 9.1 mg/dL (ref 8.4–10.5)
Chloride: 101 mEq/L (ref 96–112)
Creat: 0.73 mg/dL (ref 0.50–1.10)
Total Bilirubin: 0.3 mg/dL (ref 0.3–1.2)

## 2011-01-21 LAB — CBC WITH DIFFERENTIAL/PLATELET
Eosinophils Relative: 1 % (ref 0–5)
HCT: 34.5 % — ABNORMAL LOW (ref 36.0–46.0)
Hemoglobin: 11.3 g/dL — ABNORMAL LOW (ref 12.0–15.0)
Lymphocytes Relative: 32 % (ref 12–46)
MCV: 67.1 fL — ABNORMAL LOW (ref 78.0–100.0)
Monocytes Absolute: 1.1 10*3/uL — ABNORMAL HIGH (ref 0.1–1.0)
Monocytes Relative: 17 % — ABNORMAL HIGH (ref 3–12)
Neutro Abs: 3.1 10*3/uL (ref 1.7–7.7)
WBC: 6.3 10*3/uL (ref 4.0–10.5)

## 2011-01-21 LAB — RPR

## 2011-01-21 LAB — LIPID PANEL
Cholesterol: 133 mg/dL (ref 0–200)
HDL: 31 mg/dL — ABNORMAL LOW (ref >39)
Triglycerides: 107 mg/dL (ref <150)

## 2011-01-21 MED ORDER — ATENOLOL 50 MG PO TABS: 50.0000 mg | ORAL_TABLET | Freq: Every day | ORAL | Status: DC

## 2011-01-22 LAB — T-HELPER CELL (CD4) - (RCID CLINIC ONLY): CD4 T Cell Abs: 470 uL (ref 400–2700)

## 2011-01-22 LAB — HIV-1 RNA QUANT-NO REFLEX-BLD: HIV-1 RNA Quant, Log: 4.36 {Log} — ABNORMAL HIGH (ref <1.30)

## 2011-01-28 ENCOUNTER — Ambulatory Visit: Payer: Self-pay

## 2011-01-28 ENCOUNTER — Ambulatory Visit: Payer: Self-pay | Admitting: Internal Medicine

## 2011-03-04 ENCOUNTER — Encounter: Payer: Self-pay | Admitting: *Deleted

## 2011-04-23 ENCOUNTER — Telehealth: Payer: Self-pay | Admitting: *Deleted

## 2011-04-23 ENCOUNTER — Ambulatory Visit: Payer: Self-pay | Admitting: Infectious Disease

## 2011-04-23 NOTE — Telephone Encounter (Signed)
Called patient to reschedule and home and mobile both disconnected. Will talk to Case Management to seek out patient. Tacey Heap RN

## 2011-05-10 ENCOUNTER — Other Ambulatory Visit (INDEPENDENT_AMBULATORY_CARE_PROVIDER_SITE_OTHER): Payer: Self-pay

## 2011-05-10 ENCOUNTER — Other Ambulatory Visit: Payer: Self-pay | Admitting: *Deleted

## 2011-05-10 DIAGNOSIS — B20 Human immunodeficiency virus [HIV] disease: Secondary | ICD-10-CM

## 2011-05-10 LAB — COMPLETE METABOLIC PANEL WITH GFR
ALT: 9 U/L (ref 0–35)
AST: 23 U/L (ref 0–37)
Albumin: 4 g/dL (ref 3.5–5.2)
BUN: 10 mg/dL (ref 6–23)
Calcium: 9.1 mg/dL (ref 8.4–10.5)
Chloride: 100 mEq/L (ref 96–112)
Potassium: 4 mEq/L (ref 3.5–5.3)

## 2011-05-10 LAB — CBC WITH DIFFERENTIAL/PLATELET
Eosinophils Absolute: 0 10*3/uL (ref 0.0–0.7)
HCT: 37.1 % (ref 36.0–46.0)
Hemoglobin: 11.9 g/dL — ABNORMAL LOW (ref 12.0–15.0)
Lymphs Abs: 1.8 10*3/uL (ref 0.7–4.0)
MCH: 21.6 pg — ABNORMAL LOW (ref 26.0–34.0)
MCV: 67.2 fL — ABNORMAL LOW (ref 78.0–100.0)
Monocytes Absolute: 1 10*3/uL (ref 0.1–1.0)
Monocytes Relative: 23 % — ABNORMAL HIGH (ref 3–12)
Neutrophils Relative %: 34 % — ABNORMAL LOW (ref 43–77)
RBC: 5.52 MIL/uL — ABNORMAL HIGH (ref 3.87–5.11)

## 2011-05-11 LAB — T-HELPER CELL (CD4) - (RCID CLINIC ONLY): CD4 % Helper T Cell: 14 % — ABNORMAL LOW (ref 33–55)

## 2011-05-12 LAB — HIV-1 RNA QUANT-NO REFLEX-BLD: HIV 1 RNA Quant: 98465 copies/mL — ABNORMAL HIGH (ref <20)

## 2011-05-18 ENCOUNTER — Other Ambulatory Visit: Payer: Self-pay | Admitting: *Deleted

## 2011-05-18 ENCOUNTER — Encounter: Payer: Self-pay | Admitting: Internal Medicine

## 2011-05-18 ENCOUNTER — Ambulatory Visit (INDEPENDENT_AMBULATORY_CARE_PROVIDER_SITE_OTHER): Payer: Self-pay | Admitting: Internal Medicine

## 2011-05-18 VITALS — BP 157/113 | HR 85 | Temp 98.6°F | Wt 180.0 lb

## 2011-05-18 DIAGNOSIS — I10 Essential (primary) hypertension: Secondary | ICD-10-CM

## 2011-05-18 DIAGNOSIS — B20 Human immunodeficiency virus [HIV] disease: Secondary | ICD-10-CM

## 2011-05-18 DIAGNOSIS — F17200 Nicotine dependence, unspecified, uncomplicated: Secondary | ICD-10-CM

## 2011-05-18 DIAGNOSIS — Z23 Encounter for immunization: Secondary | ICD-10-CM

## 2011-05-18 DIAGNOSIS — F17210 Nicotine dependence, cigarettes, uncomplicated: Secondary | ICD-10-CM | POA: Insufficient documentation

## 2011-05-18 MED ORDER — ATENOLOL 50 MG PO TABS: 50.0000 mg | ORAL_TABLET | Freq: Every day | ORAL | Status: DC

## 2011-05-18 MED ORDER — CLONIDINE HCL 0.1 MG PO TABS
0.1000 mg | ORAL_TABLET | Freq: Once | ORAL | Status: AC
  Administered 2011-05-18: 0.1 mg via ORAL

## 2011-05-18 NOTE — Progress Notes (Signed)
Patient ID: Kristen Roman, female   DOB: 16-Sep-1960, 51 y.o.   MRN: [id number]  INFECTIOUS DISEASE PROGRESS NOTE    Subjective: Kristen Roman is in for her first visit since July of 2011. At that time she was supposedly started on Atripla and she says she does recall taking it but states that she did not like the way she felt so she usually would only take it 2-3 times each week. She states that she ran out completely about 3 months ago. She's also been out of her atenolol for at least the past month. She states she does not keep her appointments here because she does not like the long bus ride from [employer]. She does have a case manager who does give her vouchers to pay for the bus. She also states that her sister has a car and to probably give her a ride to her appointments. She states that she does not have a desire to quit smoking cigarettes.  Objective: Temp: 98.6 F (37 C) (02/12 1402) Temp src: Oral (02/12 1402) BP: 157/113 mmHg (02/12 1402) Pulse Rate: 85  (02/12 1402)  General: She is alert and in good spirits Mouth: She has carious and missing teeth Skin: No rash Lungs: Clear Cor: Regular S1 and S2 no murmurs Abdomen: Nontender   Lab Results HIV 1 RNA Quant (copies/mL)  Date Value  05/10/2011 98465*  01/21/2011 22900*  10/02/2009 26400*     CD4 T Cell Abs (cmm)  Date Value  05/10/2011 270*  01/21/2011 470   08/19/2009 440      Assessment: Not surprisingly, her HIV is out of control when she probably has resistance to her Atripla. I will have the genotype resistance testing today and continue observation off of antiretroviral therapy pending those results. She will need a followup within one month.  She has severe hypertension. She received a dose of clonidine here today and repeat blood pressure was much improved. She will restart atenolol tomorrow.  Plan: 1. Observe off antiretroviral therapy 2. Check a genotype resistance test 3. Restart atenolol 4. Return to  clinic in one month 5. I have encouraged her to consider quitting cigarettes completely   Cliffton Asters, MD [facility] for Infectious Diseases [facility] [phone] pager   [phone] cell 05/18/2011, 3:00 PM

## 2011-05-20 ENCOUNTER — Other Ambulatory Visit: Payer: Self-pay | Admitting: *Deleted

## 2011-05-20 NOTE — Telephone Encounter (Signed)
RN reviewed that pt's OV w/ Dr. Orvan Falconer from 05/18/11.  Dr. Orvan Falconer has stopped ART therapy currently.  Completing HIV resistance testing.  Pt to return to Center in one month.  No refill on Atripla at this time.  RN faxed back this information to [place], [place]

## 2011-05-27 LAB — HIV-1 GENOTYPR PLUS

## 2011-06-17 ENCOUNTER — Telehealth: Payer: Self-pay | Admitting: *Deleted

## 2011-06-17 NOTE — Telephone Encounter (Signed)
Patient walked into clinic thinking she had an appointment, but she never scheduled one after the last visit.  She was put on Dr. Zenaida Niece Dam's schedule for 06/22/11. Wendall Mola CMA

## 2011-06-22 ENCOUNTER — Ambulatory Visit: Payer: Self-pay | Admitting: Infectious Disease

## 2011-07-13 ENCOUNTER — Telehealth: Payer: Self-pay | Admitting: *Deleted

## 2011-07-13 NOTE — Telephone Encounter (Signed)
Patient called and left voice mail that she has been out of her meds for 2 months and she needs a sooner appointment than 08/16/11.  She last had labs in February and she had an appointment with Dr. Daiva Eves for 06/22/11 and she no showed.  Left her a voice mail to call back and press option 1 to see if she can be seen sooner. Wendall Mola CMA

## 2011-08-04 ENCOUNTER — Encounter (HOSPITAL_BASED_OUTPATIENT_CLINIC_OR_DEPARTMENT_OTHER): Payer: Self-pay

## 2011-08-04 ENCOUNTER — Emergency Department (HOSPITAL_BASED_OUTPATIENT_CLINIC_OR_DEPARTMENT_OTHER)
Admission: EM | Admit: 2011-08-04 | Discharge: 2011-08-04 | Disposition: A | Discharge status: Home / Self Care | Payer: Self-pay | Attending: Emergency Medicine | Admitting: Emergency Medicine

## 2011-08-04 DIAGNOSIS — Z21 Asymptomatic human immunodeficiency virus [HIV] infection status: Secondary | ICD-10-CM | POA: Insufficient documentation

## 2011-08-04 DIAGNOSIS — A6000 Herpesviral infection of urogenital system, unspecified: Secondary | ICD-10-CM | POA: Insufficient documentation

## 2011-08-04 DIAGNOSIS — K6289 Other specified diseases of anus and rectum: Secondary | ICD-10-CM | POA: Insufficient documentation

## 2011-08-04 DIAGNOSIS — B009 Herpesviral infection, unspecified: Secondary | ICD-10-CM

## 2011-08-04 DIAGNOSIS — I10 Essential (primary) hypertension: Secondary | ICD-10-CM | POA: Insufficient documentation

## 2011-08-04 DIAGNOSIS — M25569 Pain in unspecified knee: Secondary | ICD-10-CM | POA: Insufficient documentation

## 2011-08-04 DIAGNOSIS — Z79899 Other long term (current) drug therapy: Secondary | ICD-10-CM | POA: Insufficient documentation

## 2011-08-04 HISTORY — DX: Unspecified hemorrhoids: K64.9

## 2011-08-04 HISTORY — DX: Human immunodeficiency virus (HIV) disease: B20

## 2011-08-04 HISTORY — DX: Essential (primary) hypertension: I10

## 2011-08-04 MED ORDER — HYDROCODONE-ACETAMINOPHEN 5-500 MG PO TABS: 1.0000 | ORAL_TABLET | Freq: Four times a day (QID) | ORAL | Status: AC | PRN

## 2011-08-04 MED ORDER — HYDROCODONE-ACETAMINOPHEN 5-325 MG PO TABS
1.0000 | ORAL_TABLET | Freq: Once | ORAL | Status: AC
  Administered 2011-08-04: 1 via ORAL
  Filled 2011-08-04: qty 1

## 2011-08-04 MED ORDER — ACYCLOVIR 400 MG PO TABS: 400.0000 mg | ORAL_TABLET | Freq: Three times a day (TID) | ORAL | Status: DC

## 2011-08-04 NOTE — ED Provider Notes (Signed)
History     CSN: [id number]  Arrival date & time 08/04/11  2028   First MD Initiated Contact with Patient 08/04/11 2039      Chief Complaint  Patient presents with  . Rectal Pain  . Knee Pain    (Consider location/radiation/quality/duration/timing/severity/associated sxs/prior treatment) HPI Comments: Pt state that she has had rectal pain for the last couple of days:pt states that she is having sharp shooting pain to the area:pt states that she has previous history and was seen at [facility] twice and diagnosed with hemorrhoids:pt states that this pain is worse:pt states that she is not having any fever or abdominal pain:pt is also c/o left knee pain which pt states is similar to her arthritis:no injury:pt denies swelling or redness  The history is provided by the patient. No language interpreter was used.    Past Medical History  Diagnosis Date  . Hemorrhoid   . Hypertension   . HIV disease     Past Surgical History  Procedure Date  . Abdominal hysterectomy   . Cesarean section     No family history on file.  History  Substance Use Topics  . Smoking status: Current Some Day Smoker    Types: Cigarettes  . Smokeless tobacco: Never Used  . Alcohol Use: Yes    OB History    Grav Para Term Preterm Abortions TAB SAB Ect Mult Living                  Review of Systems  Constitutional: Negative.   Respiratory: Negative.   Cardiovascular: Negative.   Neurological: Negative.     Allergies  Review of patient's allergies indicates no known allergies.  Home Medications   Current Outpatient Rx  Name Route Sig Dispense Refill  . ACETAMINOPHEN 500 MG PO TABS Oral Take 1,000 mg by mouth every 6 (six) hours as needed. Patient used this medication for pain.    . ALBUTEROL SULFATE (2.5 MG/3ML) 0.083% IN NEBU Nebulization Take 2.5 mg by nebulization every 6 (six) hours as needed.    Marlin Canary HEADACHE PO Oral Take 1 packet by mouth daily as needed. Patient states that she  uses this medication for every pain that she has.    . ATENOLOL 50 MG PO TABS Oral Take 1 tablet (50 mg total) by mouth daily. 30 tablet 11  . EFAVIRENZ-EMTRICITAB-TENOFOVIR 600-200-300 MG PO TABS Oral Take 1 tablet by mouth at bedtime.    Marland Kitchen HYDROCORTISONE 1 % EX CREA Topical Apply 1 application topically 2 (two) times daily. Patient used this medication for pain.      BP 136/85  Pulse 69  Temp(Src) 99.1 F (37.3 C) (Oral)  Resp 18  Ht 5\' 5"  (1.651 m)  Wt 183 lb (83.008 kg)  BMI 30.45 kg/m2  SpO2 99%  Physical Exam  Nursing note and vitals reviewed. Constitutional: She is oriented to person, place, and time. She appears well-developed and well-nourished.  Cardiovascular: Normal rate and regular rhythm.   Pulmonary/Chest: Effort normal and breath sounds normal.  Abdominal: Soft. Bowel sounds are normal. There is no tenderness.  Genitourinary:       Pt has indurated lesion noted to the left rectum:no hemorrhoids or fissure noted  Musculoskeletal: Normal range of motion.       Pt has no swelling, redness or warmth noted to the left knee:pt has full rom  Neurological: She is alert and oriented to person, place, and time.  Skin: Skin is warm and dry.  ED Course  Procedures (including critical care time)   Labs Reviewed  HERPES SIMPLEX VIRUS CULTURE   No results found.   1. Herpes simplex       MDM  Culture sent:exam consistent with herpes:pt last cd4 was 270:pt is no febrile;will treat with pain and antiviral       Teressa Lower, NP 08/04/11 2136

## 2011-08-04 NOTE — ED Notes (Signed)
Pt states she does not have ride-requested/given taxi voucher

## 2011-08-04 NOTE — ED Provider Notes (Signed)
Medical screening examination/treatment/procedure(s) were performed by non-physician practitioner and as supervising physician I was immediately available for consultation/collaboration.  Brucha Ahlquist K Linker, MD 08/04/11 2201 

## 2011-08-04 NOTE — ED Notes (Addendum)
[facility] report-left knee x 1 month-rectal pain x 1 week-has been seen at [facility] ED x 2 for same c/o-pt states she was given for rx for hemorrhoids-denies injury to knee "i think that's my arthritis"

## 2011-08-05 ENCOUNTER — Telehealth: Payer: Self-pay | Admitting: *Deleted

## 2011-08-05 DIAGNOSIS — B009 Herpesviral infection, unspecified: Secondary | ICD-10-CM

## 2011-08-05 MED ORDER — ACYCLOVIR 400 MG PO TABS: 400.0000 mg | ORAL_TABLET | Freq: Three times a day (TID) | ORAL | Status: DC

## 2011-08-05 NOTE — Telephone Encounter (Signed)
States she went to md at [facility] 08/04/11. Diagnosed with herpes. Got a rx for acyclovir 1po tid . States [facility] will pay for this & wants it sent to [facility] in [place]. I called the center & spoke with Alexia Freestone, the Museum/gallery curator. She confirmed this & the NP who saw her was Teressa Lower

## 2011-08-06 LAB — HERPES SIMPLEX VIRUS CULTURE

## 2011-08-07 NOTE — ED Notes (Signed)
+  Herpes. Chart sent to [facility]P office for review. °

## 2011-08-10 ENCOUNTER — Ambulatory Visit (INDEPENDENT_AMBULATORY_CARE_PROVIDER_SITE_OTHER): Payer: Self-pay | Admitting: Internal Medicine

## 2011-08-10 ENCOUNTER — Encounter: Payer: Self-pay | Admitting: Internal Medicine

## 2011-08-10 VITALS — BP 177/99 | HR 57 | Temp 97.7°F | Ht 65.0 in | Wt 175.0 lb

## 2011-08-10 DIAGNOSIS — B20 Human immunodeficiency virus [HIV] disease: Secondary | ICD-10-CM

## 2011-08-10 DIAGNOSIS — K649 Unspecified hemorrhoids: Secondary | ICD-10-CM | POA: Insufficient documentation

## 2011-08-10 MED ORDER — EMTRICITABINE-TENOFOVIR DF 200-300 MG PO TABS: 1.0000 | ORAL_TABLET | Freq: Every day | ORAL | Status: DC

## 2011-08-10 MED ORDER — DARUNAVIR ETHANOLATE 800 MG PO TABS: 800.0000 mg | ORAL_TABLET | Freq: Every day | ORAL | Status: DC

## 2011-08-10 MED ORDER — HYDROCORTISONE ACETATE 25 MG RE SUPP: 25.0000 mg | Freq: Two times a day (BID) | RECTAL | Status: AC

## 2011-08-10 MED ORDER — IBUPROFEN 600 MG PO TABS: 600.0000 mg | ORAL_TABLET | Freq: Three times a day (TID) | ORAL | Status: AC | PRN

## 2011-08-10 MED ORDER — RITONAVIR 100 MG PO TABS: 100.0000 mg | ORAL_TABLET | Freq: Every day | ORAL | Status: DC

## 2011-08-10 NOTE — Assessment & Plan Note (Signed)
She does not have any external hemorrhoids notable but her history is of having them and having had surgical procedures for them. She does endorse small amount of blood on the toilet tissue. I told her that she should do 3 things, one is to start her antiretroviral therapy ASAP, to continue her acyclovir until it's finished and also I have given her on Anusol suppository and told her to use ibuprofen which I gave him prescription strength to help with pain.

## 2011-08-10 NOTE — Progress Notes (Signed)
  Subjective:    Patient ID: Kristen Roman, female    DOB: 01/21/1961, 51 y.o.   MRN: [id number]  HPI She comes in for a work in visit following an emergency room visit for rectal pain. She states she has had significant pain and itching over the last month that is similar to previous episodes of inflamed hemorrhoids. In the emergency room, she was given pain medicine and acyclovir but continues to have significant pain. She also presents having not taken her antiretroviral therapy for about 2 years. She was previously on Atripla but did not like it and so has not taken it. She has only had one followup visit which was in February of this year and otherwise was out of care since 2011. She is interested in starting a new regimen.   Review of Systems  Constitutional: Negative for fever, chills, diaphoresis and unexpected weight change.  HENT: Negative for sore throat and trouble swallowing.   Respiratory: Negative for cough, shortness of breath and wheezing.   Cardiovascular: Negative for chest pain, palpitations and leg swelling.  Gastrointestinal: Positive for rectal pain. Negative for nausea, abdominal pain, diarrhea and constipation.  Genitourinary: Negative for pelvic pain.  Musculoskeletal: Negative for myalgias, joint swelling and arthralgias.  Skin: Negative for rash.  Neurological: Negative for dizziness, weakness and headaches.  Hematological: Negative for adenopathy.  Psychiatric/Behavioral: Negative for dysphoric mood. The patient is not nervous/anxious.        Objective:   Physical Exam  Constitutional: She appears well-developed and well-nourished. No distress.  HENT:  Mouth/Throat: No oropharyngeal exudate.  Cardiovascular: Normal rate, regular rhythm and normal heart sounds.  Exam reveals no gallop and no friction rub.   No murmur heard. Pulmonary/Chest: Effort normal and breath sounds normal. No respiratory distress. She has no wheezes. She has no rales.  Abdominal:  Soft. Bowel sounds are normal. She exhibits no distension. There is no tenderness. There is no rebound.  Genitourinary:       No external hemorrhoids, no lesions  Lymphadenopathy:    She has no cervical adenopathy.  Skin: No rash noted.          Assessment & Plan:

## 2011-08-10 NOTE — ED Notes (Signed)
Patient on acyclovir.

## 2011-08-10 NOTE — Assessment & Plan Note (Signed)
I have discussed with her treatment options. Because of her poor compliance and lack of insight, and we'll start her on boost and Prezista and Truvada. She will start that tomorrow and I will have her return in 3-4 weeks for labs and I will see her 2 weeks later. At this time, there is no indication for prophylactic medicine. Other issues, including lipid management, colonoscopy, mammograms and Pap smears will be deferred to her next visit.

## 2011-09-06 ENCOUNTER — Other Ambulatory Visit: Payer: Self-pay | Admitting: *Deleted

## 2011-09-06 DIAGNOSIS — B009 Herpesviral infection, unspecified: Secondary | ICD-10-CM

## 2011-09-06 MED ORDER — ACYCLOVIR 400 MG PO TABS: 400.0000 mg | ORAL_TABLET | Freq: Three times a day (TID) | ORAL | Status: DC

## 2011-09-07 ENCOUNTER — Other Ambulatory Visit: Payer: Self-pay

## 2011-09-21 ENCOUNTER — Ambulatory Visit: Payer: Self-pay | Admitting: Internal Medicine

## 2011-09-21 ENCOUNTER — Telehealth: Payer: Self-pay | Admitting: *Deleted

## 2011-09-21 NOTE — Telephone Encounter (Signed)
Called and spoke with patient to notify her she missed her appt today and also no showed her lab appt.  She said she would call right back from a different phone to reschedule both those appts. Gave her the number and extension to the front office. Wendall Mola CMA

## 2011-10-02 ENCOUNTER — Other Ambulatory Visit: Payer: Self-pay | Admitting: Internal Medicine

## 2011-11-24 ENCOUNTER — Other Ambulatory Visit: Payer: Self-pay | Admitting: Licensed Clinical Social Worker

## 2011-11-24 DIAGNOSIS — B009 Herpesviral infection, unspecified: Secondary | ICD-10-CM

## 2011-11-24 MED ORDER — ACYCLOVIR 400 MG PO TABS: 400.0000 mg | ORAL_TABLET | Freq: Three times a day (TID) | ORAL | Status: DC

## 2012-02-22 ENCOUNTER — Other Ambulatory Visit: Payer: Self-pay | Admitting: Licensed Clinical Social Worker

## 2012-02-22 DIAGNOSIS — B20 Human immunodeficiency virus [HIV] disease: Secondary | ICD-10-CM

## 2012-02-22 MED ORDER — EMTRICITABINE-TENOFOVIR DF 200-300 MG PO TABS: 1.0000 | ORAL_TABLET | Freq: Every day | ORAL | Status: DC

## 2012-02-22 MED ORDER — DARUNAVIR ETHANOLATE 800 MG PO TABS
800.0000 mg | ORAL_TABLET | Freq: Every day | ORAL | Status: DC
Start: 2012-02-22 — End: 2012-10-04

## 2012-02-22 MED ORDER — RITONAVIR 100 MG PO TABS: 100.0000 mg | ORAL_TABLET | Freq: Every day | ORAL | Status: DC

## 2012-03-09 ENCOUNTER — Ambulatory Visit: Payer: Self-pay

## 2012-03-09 ENCOUNTER — Other Ambulatory Visit (INDEPENDENT_AMBULATORY_CARE_PROVIDER_SITE_OTHER): Payer: Self-pay

## 2012-03-09 DIAGNOSIS — B20 Human immunodeficiency virus [HIV] disease: Secondary | ICD-10-CM

## 2012-03-09 LAB — COMPREHENSIVE METABOLIC PANEL
Albumin: 4 g/dL (ref 3.5–5.2)
Alkaline Phosphatase: 94 U/L (ref 39–117)
BUN: 25 mg/dL — ABNORMAL HIGH (ref 6–23)
CO2: 26 mEq/L (ref 19–32)
Glucose, Bld: 77 mg/dL (ref 70–99)
Potassium: 5 mEq/L (ref 3.5–5.3)
Total Bilirubin: 0.4 mg/dL (ref 0.3–1.2)

## 2012-03-09 LAB — CBC WITH DIFFERENTIAL/PLATELET
Basophils Relative: 0 % (ref 0–1)
Eosinophils Absolute: 0.1 10*3/uL (ref 0.0–0.7)
HCT: 40.1 % (ref 36.0–46.0)
Hemoglobin: 12.8 g/dL (ref 12.0–15.0)
MCH: 24.3 pg — ABNORMAL LOW (ref 26.0–34.0)
MCHC: 31.9 g/dL (ref 30.0–36.0)
Monocytes Absolute: 0.8 10*3/uL (ref 0.1–1.0)
Monocytes Relative: 16 % — ABNORMAL HIGH (ref 3–12)

## 2012-03-10 LAB — T-HELPER CELL (CD4) - (RCID CLINIC ONLY)
CD4 % Helper T Cell: 14 % — ABNORMAL LOW (ref 33–55)
CD4 T Cell Abs: 340 uL — ABNORMAL LOW (ref 400–2700)

## 2012-03-23 ENCOUNTER — Encounter: Payer: Self-pay | Admitting: Internal Medicine

## 2012-03-23 ENCOUNTER — Ambulatory Visit (INDEPENDENT_AMBULATORY_CARE_PROVIDER_SITE_OTHER): Payer: Self-pay | Admitting: Internal Medicine

## 2012-03-23 ENCOUNTER — Ambulatory Visit: Payer: Self-pay

## 2012-03-23 VITALS — BP 179/87 | HR 51 | Temp 97.6°F | Wt 194.0 lb

## 2012-03-23 DIAGNOSIS — Z113 Encounter for screening for infections with a predominantly sexual mode of transmission: Secondary | ICD-10-CM

## 2012-03-23 DIAGNOSIS — Z79899 Other long term (current) drug therapy: Secondary | ICD-10-CM

## 2012-03-23 DIAGNOSIS — B20 Human immunodeficiency virus [HIV] disease: Secondary | ICD-10-CM

## 2012-03-23 DIAGNOSIS — Z23 Encounter for immunization: Secondary | ICD-10-CM

## 2012-03-23 NOTE — Assessment & Plan Note (Signed)
She continues to do well and will continue with same regimen. I will have her return in 4 months for fasting labs but otherwise does call sooner if she has any problems. She is not interested in smoking cessation. She was reminded to exercise.

## 2012-03-23 NOTE — Patient Instructions (Signed)
Keep it up!

## 2012-03-23 NOTE — Progress Notes (Signed)
  Subjective:    Patient ID: Kristen Roman, female    DOB: [birth year], 51 y.o.   MRN: [id number]  HPI She comes in for followup of her HIV. She was started on Prezista, Norvir and Truvada after her last visit in May and has been taking that since that time with no missed doses. She has missed followup but regardless has been doing well. She previously was on Atripla and did develop a 103 and mutation. She does continue to smoke and is not interested in quitting. She has had some weight gain. She does not exercise   Review of Systems  Constitutional: Negative for fever, activity change, fatigue and unexpected weight change.  HENT: Negative for sore throat and trouble swallowing.   Respiratory: Negative for cough and shortness of breath.   Cardiovascular: Negative for chest pain.  Gastrointestinal: Negative for nausea, abdominal pain and diarrhea.  Skin: Negative for rash.  Neurological: Negative for dizziness and headaches.  Psychiatric/Behavioral: Negative for dysphoric mood.       Objective:   Physical Exam  Constitutional: She appears well-developed and well-nourished. No distress.  HENT:  Mouth/Throat: Oropharynx is clear and moist. No oropharyngeal exudate.  Cardiovascular: Normal rate, regular rhythm and normal heart sounds.  Exam reveals no gallop and no friction rub.   No murmur heard. Lymphadenopathy:    She has no cervical adenopathy.          Assessment & Plan:

## 2012-04-20 ENCOUNTER — Other Ambulatory Visit: Payer: Self-pay | Admitting: *Deleted

## 2012-04-20 DIAGNOSIS — I10 Essential (primary) hypertension: Secondary | ICD-10-CM

## 2012-04-20 MED ORDER — ATENOLOL 50 MG PO TABS: 50.0000 mg | ORAL_TABLET | Freq: Every day | ORAL | Status: DC

## 2012-04-20 NOTE — Telephone Encounter (Signed)
Patient is out of her BP medication and needs refills.

## 2012-04-27 ENCOUNTER — Ambulatory Visit: Payer: Self-pay

## 2012-06-27 ENCOUNTER — Ambulatory Visit: Payer: Self-pay

## 2012-07-06 ENCOUNTER — Ambulatory Visit: Payer: Self-pay

## 2012-07-25 ENCOUNTER — Other Ambulatory Visit: Payer: Self-pay

## 2012-08-08 ENCOUNTER — Telehealth: Payer: Self-pay | Admitting: *Deleted

## 2012-08-08 ENCOUNTER — Ambulatory Visit: Payer: Self-pay | Admitting: Internal Medicine

## 2012-08-08 NOTE — Telephone Encounter (Signed)
Called patient before her appointment this morning to see if she was coming, was unable to get in touch with her.  Message left at home number.  Will check to see if she is with [facility]. Andree Coss

## 2012-10-03 ENCOUNTER — Other Ambulatory Visit: Payer: Self-pay | Admitting: Internal Medicine

## 2012-10-04 ENCOUNTER — Other Ambulatory Visit: Payer: Self-pay | Admitting: *Deleted

## 2012-10-04 DIAGNOSIS — B20 Human immunodeficiency virus [HIV] disease: Secondary | ICD-10-CM

## 2012-10-04 MED ORDER — DARUNAVIR ETHANOLATE 800 MG PO TABS: 800.0000 mg | ORAL_TABLET | Freq: Every day | ORAL | Status: DC

## 2012-10-04 MED ORDER — EMTRICITABINE-TENOFOVIR DF 200-300 MG PO TABS: 1.0000 | ORAL_TABLET | Freq: Every day | ORAL | Status: DC

## 2012-10-04 MED ORDER — RITONAVIR 100 MG PO TABS: 100.0000 mg | ORAL_TABLET | Freq: Every day | ORAL | Status: DC

## 2012-10-10 ENCOUNTER — Other Ambulatory Visit: Payer: Self-pay

## 2012-11-16 ENCOUNTER — Other Ambulatory Visit: Payer: Self-pay | Admitting: Licensed Clinical Social Worker

## 2012-11-16 DIAGNOSIS — B20 Human immunodeficiency virus [HIV] disease: Secondary | ICD-10-CM

## 2012-11-27 ENCOUNTER — Other Ambulatory Visit: Payer: Self-pay

## 2012-11-28 ENCOUNTER — Other Ambulatory Visit: Payer: Self-pay | Admitting: Internal Medicine

## 2012-11-28 ENCOUNTER — Other Ambulatory Visit (INDEPENDENT_AMBULATORY_CARE_PROVIDER_SITE_OTHER): Payer: Self-pay

## 2012-11-28 DIAGNOSIS — Z113 Encounter for screening for infections with a predominantly sexual mode of transmission: Secondary | ICD-10-CM

## 2012-11-28 DIAGNOSIS — B20 Human immunodeficiency virus [HIV] disease: Secondary | ICD-10-CM

## 2012-11-28 DIAGNOSIS — Z79899 Other long term (current) drug therapy: Secondary | ICD-10-CM

## 2012-11-28 LAB — CBC WITH DIFFERENTIAL/PLATELET
Basophils Absolute: 0 10*3/uL (ref 0.0–0.1)
Basophils Relative: 0 % (ref 0–1)
Eosinophils Absolute: 0.1 10*3/uL (ref 0.0–0.7)
Eosinophils Relative: 3 % (ref 0–5)
Lymphs Abs: 2.8 10*3/uL (ref 0.7–4.0)
MCH: 23.3 pg — ABNORMAL LOW (ref 26.0–34.0)
Neutrophils Relative %: 27 % — ABNORMAL LOW (ref 43–77)
Platelets: 327 10*3/uL (ref 150–400)
RBC: 5.63 MIL/uL — ABNORMAL HIGH (ref 3.87–5.11)
RDW: 17.5 % — ABNORMAL HIGH (ref 11.5–15.5)

## 2012-11-28 LAB — LIPID PANEL
Cholesterol: 164 mg/dL (ref 0–200)
HDL: 35 mg/dL — ABNORMAL LOW (ref >39)
LDL Cholesterol: 83 mg/dL (ref 0–99)
Triglycerides: 229 mg/dL — ABNORMAL HIGH (ref <150)
VLDL: 46 mg/dL — ABNORMAL HIGH (ref 0–40)

## 2012-11-28 LAB — COMPLETE METABOLIC PANEL WITH GFR
ALT: 9 U/L (ref 0–35)
AST: 14 U/L (ref 0–37)
Alkaline Phosphatase: 82 U/L (ref 39–117)
Creat: 0.58 mg/dL (ref 0.50–1.10)
Sodium: 137 mEq/L (ref 135–145)
Total Bilirubin: 0.3 mg/dL (ref 0.3–1.2)
Total Protein: 7.5 g/dL (ref 6.0–8.3)

## 2012-11-29 LAB — T-HELPER CELL (CD4) - (RCID CLINIC ONLY)
CD4 % Helper T Cell: 16 % — ABNORMAL LOW (ref 33–55)
CD4 T Cell Abs: 460 /uL (ref 400–2700)

## 2012-11-30 ENCOUNTER — Other Ambulatory Visit: Payer: Self-pay | Admitting: Internal Medicine

## 2012-11-30 ENCOUNTER — Telehealth: Payer: Self-pay | Admitting: *Deleted

## 2012-11-30 DIAGNOSIS — B20 Human immunodeficiency virus [HIV] disease: Secondary | ICD-10-CM

## 2012-11-30 LAB — HIV-1 RNA QUANT-NO REFLEX-BLD: HIV 1 RNA Quant: 1020 copies/mL — ABNORMAL HIGH (ref <20)

## 2012-11-30 NOTE — Telephone Encounter (Signed)
Left pt a message, asking her to call back and let us know how she has been taking her medication. Andree Coss, RN

## 2012-11-30 NOTE — Telephone Encounter (Signed)
She has no appt scheduled (multiple no shows).  Remind her that she needs to be seen after the genotype. Thanks

## 2012-11-30 NOTE — Telephone Encounter (Signed)
Message copied by Andree Coss on Thu Nov 30, 2012  1:56 PM ------      Message from: Gardiner Barefoot      Created: Thu Nov 30, 2012 12:41 PM       She has detectable virus.  Can you see if she just restarted her meds or has been on them longer than a month.  If longer than a month, add on a genotype.  thanks ------

## 2012-11-30 NOTE — Addendum Note (Signed)
Addended bySteva Colder on: 11/30/2012 02:39 PM   Modules accepted: Orders

## 2012-11-30 NOTE — Telephone Encounter (Signed)
Per patient she has been on same regimen for over 2 years and has only missed a couple of doses.  Genotype added. Wendall Mola

## 2012-11-30 NOTE — Telephone Encounter (Signed)
I already told her we would call after the genotype Wendall Mola

## 2012-12-06 LAB — HIV-1 GENOTYPR PLUS

## 2012-12-13 ENCOUNTER — Other Ambulatory Visit: Payer: Self-pay | Admitting: Internal Medicine

## 2012-12-14 ENCOUNTER — Ambulatory Visit: Payer: Self-pay | Admitting: Infectious Disease

## 2013-01-02 ENCOUNTER — Encounter: Payer: Self-pay | Admitting: Internal Medicine

## 2013-01-02 ENCOUNTER — Ambulatory Visit (INDEPENDENT_AMBULATORY_CARE_PROVIDER_SITE_OTHER): Payer: Self-pay | Admitting: Internal Medicine

## 2013-01-02 VITALS — BP 137/85 | HR 54 | Temp 98.1°F | Ht 65.0 in | Wt 209.0 lb

## 2013-01-02 DIAGNOSIS — B20 Human immunodeficiency virus [HIV] disease: Secondary | ICD-10-CM

## 2013-01-02 DIAGNOSIS — I10 Essential (primary) hypertension: Secondary | ICD-10-CM

## 2013-01-02 DIAGNOSIS — Z23 Encounter for immunization: Secondary | ICD-10-CM

## 2013-01-02 DIAGNOSIS — F17210 Nicotine dependence, cigarettes, uncomplicated: Secondary | ICD-10-CM

## 2013-01-02 DIAGNOSIS — F17200 Nicotine dependence, unspecified, uncomplicated: Secondary | ICD-10-CM

## 2013-01-02 MED ORDER — ATENOLOL 100 MG PO TABS: 100.0000 mg | ORAL_TABLET | Freq: Every day | ORAL | Status: DC

## 2013-01-02 MED ORDER — NAPROXEN 375 MG PO TABS: 375.0000 mg | ORAL_TABLET | Freq: Two times a day (BID) | ORAL | Status: DC

## 2013-01-02 NOTE — Assessment & Plan Note (Signed)
Her blood pressure is good however on twice the dose of atenolol. I will change her to 100 mg once a day.

## 2013-01-02 NOTE — Progress Notes (Signed)
  Subjective:    Patient ID: Kristen Roman, female    DOB: 01/31/1961, 52 y.o.   MRN: [id number]  HPI She comes in for followup of her HIV. She has a long history of HIV and a long history of poor compliance. Over the last several years she has had one lab result with an undetectable virus and otherwise has been highly detectable. Her last CD4 count was 560 but her viral load, after being undetectable, was over 1000. A genotype was unable to be done. In discussion with her, she endorses poor compliance, taking the medicines about 4 times a week. She continues to drink alcohol daily and otherwise continues to smoke and is taking her blood pressure medicine, but has noticed a significantly elevated blood pressure and has been doubling her atenolol. No headaches, no weight loss, no diarrhea   Review of Systems  Constitutional: Negative for fatigue.  HENT: Negative for sore throat.   Eyes: Negative for visual disturbance.  Respiratory: Negative for shortness of breath.   Cardiovascular: Negative for chest pain and leg swelling.  Gastrointestinal: Negative for diarrhea.  Musculoskeletal: Negative for myalgias and arthralgias.  Skin: Negative for rash.  Neurological: Negative for dizziness, light-headedness and headaches.  Hematological: Negative for adenopathy.  Psychiatric/Behavioral: Negative for dysphoric mood.       Objective:   Physical Exam  Constitutional: She is oriented to person, place, and time. She appears well-developed and well-nourished. No distress.  HENT:  Mouth/Throat: No oropharyngeal exudate.  Eyes: No scleral icterus.  Cardiovascular: Normal rate, regular rhythm and normal heart sounds.   No murmur heard. Pulmonary/Chest: Breath sounds normal. No respiratory distress.  Lymphadenopathy:    She has no cervical adenopathy.  Neurological: She is alert and oriented to person, place, and time.  Skin: No rash noted.  Psychiatric: She has a normal mood and affect. Her  behavior is normal.          Assessment & Plan:

## 2013-01-02 NOTE — Progress Notes (Signed)
  [facility] - Pharmacist    HPI: Kristen Roman is a 52 y.o. female here for follow-up of lab results.  Allergies: No Known Allergies  Vitals: Temp: 98.1 F (36.7 C) (09/30 0928) Temp src: Oral (09/30 0928) BP: 137/85 mmHg (09/30 0928) Pulse Rate: 54 (09/30 7829)  Past Medical History: Past Medical History  Diagnosis Date  . Hemorrhoid   . Hypertension   . HIV disease   . Hemorrhoid     Social History: History   Social History  . Marital Status: Single    Spouse Name: N/A    Number of Children: N/A  . Years of Education: N/A   Social History Main Topics  . Smoking status: Current Every Day Smoker -- 0.25 packs/day    Types: Cigarettes    Start date: 04/06/1975  . Smokeless tobacco: Never Used     Comment: pt. not ready to quit  . Alcohol Use: 50.0 oz/week    100 drink(s) per week     Comment: pt. states she is an alcoholic  . Drug Use: 3.00 per week    Special: Marijuana  . Sexual Activity: Not Currently    Partners: Male     Comment: pt. given condoms   Other Topics Concern  . None   Social History Narrative  . 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  Lipids:    Component Value Date/Time   CHOL 164 11/28/2012 1003   TRIG 229* 11/28/2012 1003   HDL 35* 11/28/2012 1003   CHOLHDL 4.7 11/28/2012 1003   VLDL 46* 11/28/2012 1003   LDLCALC 83 11/28/2012 1003    Assessment: Kristen Roman now has detectable viremia after previously being undetectable.  She admits to taking her medication only 4 times per week on average.  She also is inconsistent about taking her Prezista with food.  Recommendations: Adherence counseling performed with good response from patient. Pill box provided per patient request. She will try to take her Prezista with food as she is able. Will follow-up at her next visit for additional counseling needs.  Kristen Roman, Pharm.D., BCPS, AAHIVP Clinical Infectious Disease Pharmacist [facility] 01/02/2013, 10:54 AM

## 2013-01-02 NOTE — Assessment & Plan Note (Signed)
She is doing poorly to 2 poor compliance. I will have her continue with her current regimen, but I will check her labs again today including a genotype. She will then follow up in one month. If the genotype shows resistance, she will be notified in her regimen will be changed.

## 2013-01-02 NOTE — Assessment & Plan Note (Signed)
She is precontemplative at this time 

## 2013-01-03 LAB — T-HELPER CELL (CD4) - (RCID CLINIC ONLY): CD4 % Helper T Cell: 18 % — ABNORMAL LOW (ref 33–55)

## 2013-02-06 ENCOUNTER — Ambulatory Visit: Payer: Self-pay | Admitting: Internal Medicine

## 2013-02-07 ENCOUNTER — Other Ambulatory Visit: Payer: Self-pay | Admitting: Internal Medicine

## 2013-02-07 DIAGNOSIS — F329 Major depressive disorder, single episode, unspecified: Secondary | ICD-10-CM

## 2013-02-07 MED ORDER — HYDROXYZINE PAMOATE 25 MG PO CAPS: 25.0000 mg | ORAL_CAPSULE | Freq: Three times a day (TID) | ORAL | Status: DC | PRN

## 2013-02-07 MED ORDER — CITALOPRAM HYDROBROMIDE 10 MG PO TABS: 10.0000 mg | ORAL_TABLET | Freq: Every day | ORAL | Status: DC

## 2013-02-12 ENCOUNTER — Ambulatory Visit: Payer: Self-pay | Admitting: Internal Medicine

## 2013-02-12 ENCOUNTER — Telehealth: Payer: Self-pay | Admitting: *Deleted

## 2013-02-12 NOTE — Telephone Encounter (Signed)
Called and left patient a voice mail to call the clinic. Today was her 3rd no show and she needs to come to the walk in clinic. Wendall Mola

## 2013-03-19 ENCOUNTER — Other Ambulatory Visit: Payer: Self-pay | Admitting: Internal Medicine

## 2013-03-19 ENCOUNTER — Telehealth: Payer: Self-pay | Admitting: *Deleted

## 2013-03-19 DIAGNOSIS — B20 Human immunodeficiency virus [HIV] disease: Secondary | ICD-10-CM

## 2013-03-19 NOTE — Telephone Encounter (Signed)
I authorized 1 more month of HIV refills today, with the note that she must be seen for additional refills.

## 2013-03-19 NOTE — Telephone Encounter (Signed)
Yes, ok with me for the 19th

## 2013-03-19 NOTE — Telephone Encounter (Signed)
Pt requested "work-in" appt for Friday, Dec. 19 @ 1100 with Dr. Luciana Axe.  Did not add the pt due to previous No Show appointments.  She already has arranged transportation for another appointment that afternoon.   Pt has had 3 No Show appointments.

## 2013-03-23 ENCOUNTER — Ambulatory Visit (INDEPENDENT_AMBULATORY_CARE_PROVIDER_SITE_OTHER): Payer: Medicaid Other | Admitting: Internal Medicine

## 2013-03-23 ENCOUNTER — Encounter: Payer: Self-pay | Admitting: Internal Medicine

## 2013-03-23 VITALS — BP 171/98 | HR 44 | Temp 98.2°F | Ht 65.0 in | Wt 213.8 lb

## 2013-03-23 DIAGNOSIS — F17210 Nicotine dependence, cigarettes, uncomplicated: Secondary | ICD-10-CM

## 2013-03-23 DIAGNOSIS — Z23 Encounter for immunization: Secondary | ICD-10-CM

## 2013-03-23 DIAGNOSIS — F329 Major depressive disorder, single episode, unspecified: Secondary | ICD-10-CM

## 2013-03-23 DIAGNOSIS — F17200 Nicotine dependence, unspecified, uncomplicated: Secondary | ICD-10-CM

## 2013-03-23 DIAGNOSIS — I10 Essential (primary) hypertension: Secondary | ICD-10-CM

## 2013-03-23 DIAGNOSIS — B20 Human immunodeficiency virus [HIV] disease: Secondary | ICD-10-CM

## 2013-03-23 LAB — COMPLETE METABOLIC PANEL WITH GFR
ALT: 10 U/L (ref 0–35)
Alkaline Phosphatase: 90 U/L (ref 39–117)
CO2: 24 mEq/L (ref 19–32)
Calcium: 9.3 mg/dL (ref 8.4–10.5)
Chloride: 106 mEq/L (ref 96–112)
GFR, Est African American: >89 mL/min
Potassium: 4.3 mEq/L (ref 3.5–5.3)
Sodium: 140 mEq/L (ref 135–145)
Total Protein: 7.5 g/dL (ref 6.0–8.3)

## 2013-03-23 LAB — CBC WITH DIFFERENTIAL/PLATELET
Basophils Absolute: 0 10*3/uL (ref 0.0–0.1)
Basophils Relative: 0 % (ref 0–1)
Eosinophils Relative: 3 % (ref 0–5)
Lymphocytes Relative: 59 % — ABNORMAL HIGH (ref 12–46)
Lymphs Abs: 3 10*3/uL (ref 0.7–4.0)
MCH: 24.7 pg — ABNORMAL LOW (ref 26.0–34.0)
MCV: 76 fL — ABNORMAL LOW (ref 78.0–100.0)
Neutro Abs: 1.4 10*3/uL — ABNORMAL LOW (ref 1.7–7.7)
Neutrophils Relative %: 29 % — ABNORMAL LOW (ref 43–77)
Platelets: 281 10*3/uL (ref 150–400)
RBC: 5.62 MIL/uL — ABNORMAL HIGH (ref 3.87–5.11)
RDW: 16.6 % — ABNORMAL HIGH (ref 11.5–15.5)
WBC: 5 10*3/uL (ref 4.0–10.5)

## 2013-03-23 MED ORDER — RITONAVIR 100 MG PO TABS: 100.0000 mg | ORAL_TABLET | Freq: Every day | ORAL | Status: DC

## 2013-03-23 MED ORDER — DARUNAVIR ETHANOLATE 800 MG PO TABS: 800.0000 mg | ORAL_TABLET | Freq: Every day | ORAL | Status: DC

## 2013-03-23 MED ORDER — CLONIDINE HCL 0.1 MG PO TABS
0.2000 mg | ORAL_TABLET | Freq: Once | ORAL | Status: AC
  Administered 2013-03-23: 0.2 mg via ORAL

## 2013-03-23 MED ORDER — CITALOPRAM HYDROBROMIDE 10 MG PO TABS: 10.0000 mg | ORAL_TABLET | Freq: Every day | ORAL | Status: DC

## 2013-03-23 MED ORDER — LISINOPRIL-HYDROCHLOROTHIAZIDE 10-12.5 MG PO TABS: 1.0000 | ORAL_TABLET | Freq: Every day | ORAL | Status: DC

## 2013-03-23 MED ORDER — EMTRICITABINE-TENOFOVIR DF 200-300 MG PO TABS: 1.0000 | ORAL_TABLET | Freq: Every day | ORAL | Status: DC

## 2013-03-23 NOTE — Assessment & Plan Note (Signed)
Recheck labs today and rtc in 4 days

## 2013-03-23 NOTE — Assessment & Plan Note (Signed)
I will stop atenolol since it seems to have no effect, try lisinopril/HCTZ and recheck in 4 days.  Given clonidine here and bp decreased.  Encouraged extensive lifestyle modifications starting with smoking cessation.  rtc in 4 days to recheck

## 2013-03-23 NOTE — Progress Notes (Signed)
   Subjective:    Patient ID: Kristen Roman, female    DOB: 16-Dec-1960, 52 y.o.   MRN: [id number]  HPI She comes in for follow up.  Multiple missed appts, last seen in September and reported sporadic compliance with Prezista, norvir and Truvada.  Viral load too low for genotype but was 370.  CD4 over 500.  Some better compliance but misses 1-2 times per week.  States she sometimes remembers after she goes to bed but does not feel like getting up again.  Understands the importance of taking it.  Just got false teeth.  BP up despite taking atenolol.  No headache, no vision changes.  Eats pork, adds salt "a lot", smokes when she drinks, drinks at least a 6 pack daily.     Review of Systems  Constitutional: Negative for unexpected weight change.  HENT: Negative for sore throat.   Eyes: Negative for visual disturbance.  Respiratory: Negative for cough and shortness of breath.   Gastrointestinal: Negative for nausea and diarrhea.  Endocrine: Negative for polyuria.  Musculoskeletal: Negative for gait problem.  Skin: Negative for rash.  Neurological: Negative for dizziness, light-headedness and headaches.  Psychiatric/Behavioral: Negative for dysphoric mood.       Objective:   Physical Exam  Constitutional: She appears well-developed and well-nourished. No distress.  HENT:  Mouth/Throat: No oropharyngeal exudate.  Eyes: Right eye exhibits no discharge. Left eye exhibits no discharge. No scleral icterus.  Cardiovascular: Normal rate, regular rhythm and normal heart sounds.   No murmur heard. Pulmonary/Chest: Effort normal and breath sounds normal. No respiratory distress. She has no wheezes.  Abdominal: Soft. Bowel sounds are normal. She exhibits no distension. There is no tenderness.  Musculoskeletal: She exhibits no edema.  Lymphadenopathy:    She has no cervical adenopathy.  Skin: No rash noted.  Psychiatric:  Poor insight into disease          Assessment & Plan:

## 2013-03-23 NOTE — Assessment & Plan Note (Signed)
Refilled celexa through [facility]

## 2013-03-23 NOTE — Assessment & Plan Note (Signed)
Encouraged cessation.

## 2013-03-26 ENCOUNTER — Encounter: Payer: Self-pay | Admitting: Internal Medicine

## 2013-03-27 ENCOUNTER — Ambulatory Visit: Payer: Medicaid Other | Admitting: Internal Medicine

## 2013-03-27 LAB — HIV-1 RNA QUANT-NO REFLEX-BLD: HIV-1 RNA Quant, Log: 1.72 {Log} — ABNORMAL HIGH (ref <1.30)

## 2013-04-10 ENCOUNTER — Ambulatory Visit: Payer: Medicaid Other | Admitting: *Deleted

## 2013-04-11 ENCOUNTER — Telehealth: Payer: Self-pay | Admitting: *Deleted

## 2013-04-11 NOTE — Telephone Encounter (Signed)
FYI-patient no showed appointment with Max Menius. Myrtis Hopping

## 2013-06-14 ENCOUNTER — Ambulatory Visit (INDEPENDENT_AMBULATORY_CARE_PROVIDER_SITE_OTHER): Payer: Medicaid Other | Admitting: Internal Medicine

## 2013-06-14 VITALS — BP 121/64 | HR 65 | Temp 97.8°F | Wt 212.0 lb

## 2013-06-14 DIAGNOSIS — IMO0001 Reserved for inherently not codable concepts without codable children: Secondary | ICD-10-CM

## 2013-06-14 DIAGNOSIS — M7918 Myalgia, other site: Secondary | ICD-10-CM

## 2013-06-14 DIAGNOSIS — B20 Human immunodeficiency virus [HIV] disease: Secondary | ICD-10-CM

## 2013-06-14 MED ORDER — CYCLOBENZAPRINE HCL 10 MG PO TABS: 10.0000 mg | ORAL_TABLET | Freq: Three times a day (TID) | ORAL | Status: DC | PRN
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
## 2013-07-20 ENCOUNTER — Other Ambulatory Visit: Payer: Self-pay | Admitting: Licensed Clinical Social Worker

## 2013-07-20 DIAGNOSIS — I10 Essential (primary) hypertension: Secondary | ICD-10-CM

## 2013-07-20 MED ORDER — LISINOPRIL-HYDROCHLOROTHIAZIDE 10-12.5 MG PO TABS: 1.0000 | ORAL_TABLET | Freq: Every day | ORAL | Status: DC

## 2013-11-02 ENCOUNTER — Other Ambulatory Visit: Payer: Self-pay | Admitting: Internal Medicine

## 2013-12-21 ENCOUNTER — Other Ambulatory Visit: Payer: Self-pay | Admitting: *Deleted

## 2013-12-21 ENCOUNTER — Other Ambulatory Visit: Payer: Self-pay | Admitting: Internal Medicine

## 2013-12-21 DIAGNOSIS — I10 Essential (primary) hypertension: Secondary | ICD-10-CM

## 2013-12-21 MED ORDER — LISINOPRIL-HYDROCHLOROTHIAZIDE 10-12.5 MG PO TABS: 1.0000 | ORAL_TABLET | Freq: Every day | ORAL | Status: DC

## 2013-12-25 ENCOUNTER — Ambulatory Visit: Payer: Medicaid Other

## 2013-12-25 ENCOUNTER — Other Ambulatory Visit: Payer: Medicaid Other

## 2014-01-18 ENCOUNTER — Other Ambulatory Visit: Payer: Self-pay | Admitting: Internal Medicine

## 2014-01-18 ENCOUNTER — Other Ambulatory Visit: Payer: Self-pay | Admitting: *Deleted

## 2014-01-18 DIAGNOSIS — I10 Essential (primary) hypertension: Secondary | ICD-10-CM

## 2014-01-18 MED ORDER — LISINOPRIL-HYDROCHLOROTHIAZIDE 10-12.5 MG PO TABS: 1.0000 | ORAL_TABLET | Freq: Every day | ORAL | Status: DC

## 2014-01-21 ENCOUNTER — Ambulatory Visit: Payer: Medicaid Other

## 2014-01-22 ENCOUNTER — Ambulatory Visit: Payer: Medicaid Other | Admitting: Internal Medicine

## 2014-02-21 ENCOUNTER — Other Ambulatory Visit: Payer: Self-pay | Admitting: Internal Medicine

## 2014-03-26 ENCOUNTER — Other Ambulatory Visit: Payer: Self-pay | Admitting: Internal Medicine

## 2014-03-27 ENCOUNTER — Other Ambulatory Visit: Payer: Self-pay | Admitting: Internal Medicine

## 2014-06-13 ENCOUNTER — Other Ambulatory Visit: Payer: Self-pay | Admitting: *Deleted

## 2014-06-13 DIAGNOSIS — Z113 Encounter for screening for infections with a predominantly sexual mode of transmission: Secondary | ICD-10-CM

## 2014-06-20 ENCOUNTER — Other Ambulatory Visit: Payer: Self-pay | Admitting: Internal Medicine

## 2014-06-20 ENCOUNTER — Telehealth: Payer: Self-pay | Admitting: *Deleted

## 2014-06-20 NOTE — Telephone Encounter (Signed)
Received request for medication refills. Last labs 15 months ago, last office 12 months ago. Refills denied, stating pt needed an appointment.  RN called patient, left message informing her she needed to make an appointment/have labs before we could refill her medication. Landis Gandy, RN

## 2014-06-21 ENCOUNTER — Telehealth: Payer: Self-pay | Admitting: *Deleted

## 2014-06-21 NOTE — Telephone Encounter (Signed)
Patient called very upset and verbally abusive. She advised she just need her BP medication and she does not want to come in for a visit. She advised she lives in [facility] and has a hard time getting here and back. Advised she needs labs and appt in the same day. Scheduled her for an appt 07/04/14 at 145 pm with Dr Tommy Medal advised the patient she can talk to the doctor at that time about medications and that she can always find a PCP in [facility] [address] for the BP medications. She does not see why she has to come all this way,  we know she has high blood pressure and HIV and this is stupid. She cursed a few times and hung up the phone.

## 2014-07-04 ENCOUNTER — Ambulatory Visit: Payer: Medicaid Other | Admitting: Infectious Disease

## 2018-10-23 ENCOUNTER — Other Ambulatory Visit: Payer: Self-pay

## 2018-10-23 ENCOUNTER — Encounter (HOSPITAL_BASED_OUTPATIENT_CLINIC_OR_DEPARTMENT_OTHER): Payer: Self-pay | Admitting: Emergency Medicine

## 2018-10-23 ENCOUNTER — Emergency Department (HOSPITAL_BASED_OUTPATIENT_CLINIC_OR_DEPARTMENT_OTHER)
Admission: EM | Admit: 2018-10-23 | Discharge: 2018-10-23 | Disposition: A | Discharge status: Home / Self Care | Payer: Medicaid Other | Attending: Emergency Medicine | Admitting: Emergency Medicine

## 2018-10-23 DIAGNOSIS — Z21 Asymptomatic human immunodeficiency virus [HIV] infection status: Secondary | ICD-10-CM | POA: Diagnosis not present

## 2018-10-23 DIAGNOSIS — C07 Malignant neoplasm of parotid gland: Secondary | ICD-10-CM | POA: Diagnosis not present

## 2018-10-23 DIAGNOSIS — C7989 Secondary malignant neoplasm of other specified sites: Secondary | ICD-10-CM | POA: Diagnosis not present

## 2018-10-23 DIAGNOSIS — F17210 Nicotine dependence, cigarettes, uncomplicated: Secondary | ICD-10-CM | POA: Insufficient documentation

## 2018-10-23 DIAGNOSIS — Z79899 Other long term (current) drug therapy: Secondary | ICD-10-CM | POA: Insufficient documentation

## 2018-10-23 DIAGNOSIS — H9201 Otalgia, right ear: Secondary | ICD-10-CM | POA: Diagnosis present

## 2018-10-23 DIAGNOSIS — M792 Neuralgia and neuritis, unspecified: Secondary | ICD-10-CM

## 2018-10-23 DIAGNOSIS — I10 Essential (primary) hypertension: Secondary | ICD-10-CM

## 2018-10-23 HISTORY — DX: Malignant (primary) neoplasm, unspecified: C80.1

## 2018-10-23 MED ORDER — GABAPENTIN 300 MG PO CAPS: 300.0000 mg | ORAL_CAPSULE | Freq: Three times a day (TID) | ORAL | 0 refills | Status: DC

## 2018-10-23 MED ORDER — DIPHENHYDRAMINE HCL 25 MG PO CAPS
25.0000 mg | ORAL_CAPSULE | Freq: Once | ORAL | Status: AC
  Administered 2018-10-23: 25 mg via ORAL
  Filled 2018-10-23: qty 1

## 2018-10-23 MED ORDER — LORAZEPAM 1 MG PO TABS
0.5000 mg | ORAL_TABLET | Freq: Once | ORAL | Status: AC
  Administered 2018-10-23: 0.5 mg via ORAL
  Filled 2018-10-23: qty 1

## 2018-10-23 MED ORDER — OXYCODONE-ACETAMINOPHEN 5-325 MG PO TABS: 1.0000 | ORAL_TABLET | Freq: Four times a day (QID) | ORAL | 0 refills | Status: DC | PRN

## 2018-10-23 MED ORDER — OXYCODONE-ACETAMINOPHEN 5-325 MG PO TABS
1.0000 | ORAL_TABLET | Freq: Once | ORAL | Status: AC
  Administered 2018-10-23: 1 via ORAL
  Filled 2018-10-23: qty 1

## 2018-10-23 MED ORDER — CARBAMAZEPINE 200 MG PO TABS
200.0000 mg | ORAL_TABLET | Freq: Once | ORAL | Status: AC
  Administered 2018-10-23: 200 mg via ORAL
  Filled 2018-10-23: qty 1

## 2018-10-23 MED ORDER — LISINOPRIL 10 MG PO TABS
20.0000 mg | ORAL_TABLET | Freq: Once | ORAL | Status: AC
  Administered 2018-10-23: 20 mg via ORAL
  Filled 2018-10-23: qty 2
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
## 2018-10-23 NOTE — ED Notes (Signed)
Pt continues to request cab voucher. States anyone available to pick her up is working or on the way to work. Pt resting comfortably in bed

## 2018-10-23 NOTE — ED Notes (Signed)
ED Provider at bedside. 

## 2018-10-23 NOTE — Discharge Instructions (Addendum)
It was our pleasure to provide your ER care today - we hope that you feel better.  Take neurontin as prescribed to help with your symptoms.    You may also take percocet as need for pain. No driving for the next 6 hours or when taking percocet. Also, do not take tylenol or acetaminophen containing medication when taking percocet.   Your blood pressure is high - continue your blood pressure medication, limit salt intake, and follow up with primary care doctor in the coming week for recheck of blood pressure.   Also, follow up with your doctor/oncologist in the coming week - discuss plan for pain/symptom control.  Return to ER if worse, new symptoms, fevers, intractable pain, or other concern.

## 2018-10-23 NOTE — ED Triage Notes (Signed)
Pt reports ear pain x9 hours, states numb/itching sensation. Concerned there's something in ear - minor wax build up but no foreign body noted in triage. Reports she usually goes to [facility] and they give her a shot for ear pain - hx of bell's palsy. Unable to report what medication she is referring to.

## 2018-11-06 ENCOUNTER — Emergency Department (HOSPITAL_BASED_OUTPATIENT_CLINIC_OR_DEPARTMENT_OTHER): Payer: Medicaid Other

## 2018-11-06 ENCOUNTER — Inpatient Hospital Stay (HOSPITAL_BASED_OUTPATIENT_CLINIC_OR_DEPARTMENT_OTHER)
Admission: EM | Admit: 2018-11-06 | Discharge: 2018-11-09 | DRG: 683 | Disposition: A | Discharge status: Home / Self Care | Payer: Medicaid Other | Attending: Internal Medicine | Admitting: Internal Medicine

## 2018-11-06 ENCOUNTER — Other Ambulatory Visit: Payer: Self-pay

## 2018-11-06 ENCOUNTER — Encounter (HOSPITAL_BASED_OUTPATIENT_CLINIC_OR_DEPARTMENT_OTHER): Payer: Self-pay | Admitting: Emergency Medicine

## 2018-11-06 DIAGNOSIS — F17210 Nicotine dependence, cigarettes, uncomplicated: Secondary | ICD-10-CM | POA: Diagnosis present

## 2018-11-06 DIAGNOSIS — Z20828 Contact with and (suspected) exposure to other viral communicable diseases: Secondary | ICD-10-CM | POA: Diagnosis present

## 2018-11-06 DIAGNOSIS — I472 Ventricular tachycardia: Secondary | ICD-10-CM | POA: Diagnosis not present

## 2018-11-06 DIAGNOSIS — R52 Pain, unspecified: Secondary | ICD-10-CM

## 2018-11-06 DIAGNOSIS — I959 Hypotension, unspecified: Secondary | ICD-10-CM | POA: Diagnosis present

## 2018-11-06 DIAGNOSIS — Z90710 Acquired absence of both cervix and uterus: Secondary | ICD-10-CM

## 2018-11-06 DIAGNOSIS — F329 Major depressive disorder, single episode, unspecified: Secondary | ICD-10-CM | POA: Diagnosis present

## 2018-11-06 DIAGNOSIS — I951 Orthostatic hypotension: Secondary | ICD-10-CM | POA: Diagnosis present

## 2018-11-06 DIAGNOSIS — M2550 Pain in unspecified joint: Secondary | ICD-10-CM | POA: Diagnosis present

## 2018-11-06 DIAGNOSIS — N179 Acute kidney failure, unspecified: Principal | ICD-10-CM | POA: Diagnosis present

## 2018-11-06 DIAGNOSIS — R296 Repeated falls: Secondary | ICD-10-CM | POA: Diagnosis present

## 2018-11-06 DIAGNOSIS — M4856XA Collapsed vertebra, not elsewhere classified, lumbar region, initial encounter for fracture: Secondary | ICD-10-CM | POA: Diagnosis present

## 2018-11-06 DIAGNOSIS — K5900 Constipation, unspecified: Secondary | ICD-10-CM | POA: Diagnosis present

## 2018-11-06 DIAGNOSIS — F32A Depression, unspecified: Secondary | ICD-10-CM | POA: Diagnosis present

## 2018-11-06 DIAGNOSIS — Z79899 Other long term (current) drug therapy: Secondary | ICD-10-CM

## 2018-11-06 DIAGNOSIS — M19011 Primary osteoarthritis, right shoulder: Secondary | ICD-10-CM | POA: Diagnosis present

## 2018-11-06 DIAGNOSIS — E871 Hypo-osmolality and hyponatremia: Secondary | ICD-10-CM | POA: Diagnosis present

## 2018-11-06 DIAGNOSIS — E878 Other disorders of electrolyte and fluid balance, not elsewhere classified: Secondary | ICD-10-CM | POA: Diagnosis present

## 2018-11-06 DIAGNOSIS — I10 Essential (primary) hypertension: Secondary | ICD-10-CM | POA: Diagnosis present

## 2018-11-06 DIAGNOSIS — Z21 Asymptomatic human immunodeficiency virus [HIV] infection status: Secondary | ICD-10-CM | POA: Diagnosis present

## 2018-11-06 DIAGNOSIS — C7951 Secondary malignant neoplasm of bone: Secondary | ICD-10-CM | POA: Diagnosis present

## 2018-11-06 DIAGNOSIS — E860 Dehydration: Secondary | ICD-10-CM | POA: Diagnosis present

## 2018-11-06 DIAGNOSIS — C07 Malignant neoplasm of parotid gland: Secondary | ICD-10-CM | POA: Diagnosis present

## 2018-11-06 DIAGNOSIS — E861 Hypovolemia: Secondary | ICD-10-CM | POA: Diagnosis present

## 2018-11-06 LAB — CBC WITH DIFFERENTIAL/PLATELET
Abs Immature Granulocytes: 0.25 10*3/uL — ABNORMAL HIGH (ref 0.00–0.07)
Basophils Absolute: 0 10*3/uL (ref 0.0–0.1)
Basophils Relative: 0 %
Eosinophils Absolute: 0 10*3/uL (ref 0.0–0.5)
Eosinophils Relative: 0 %
HCT: 38.9 % (ref 36.0–46.0)
Hemoglobin: 12.9 g/dL (ref 12.0–15.0)
Immature Granulocytes: 4 %
Lymphocytes Relative: 33 %
Lymphs Abs: 2.2 10*3/uL (ref 0.7–4.0)
MCH: 23.4 pg — ABNORMAL LOW (ref 26.0–34.0)
MCHC: 33.2 g/dL (ref 30.0–36.0)
MCV: 70.6 fL — ABNORMAL LOW (ref 80.0–100.0)
Monocytes Absolute: 1 10*3/uL (ref 0.1–1.0)
Monocytes Relative: 14 %
Neutro Abs: 3.3 10*3/uL (ref 1.7–7.7)
Neutrophils Relative %: 49 %
Platelets: 199 10*3/uL (ref 150–400)
RBC: 5.51 MIL/uL — ABNORMAL HIGH (ref 3.87–5.11)
RDW: 14.6 % (ref 11.5–15.5)
WBC: 6.8 10*3/uL (ref 4.0–10.5)
nRBC: 0 % (ref 0.0–0.2)

## 2018-11-06 LAB — COMPREHENSIVE METABOLIC PANEL
ALT: 14 U/L (ref 0–44)
AST: 38 U/L (ref 15–41)
Albumin: 3.4 g/dL — ABNORMAL LOW (ref 3.5–5.0)
Alkaline Phosphatase: 111 U/L (ref 38–126)
Anion gap: 15 (ref 5–15)
BUN: 42 mg/dL — ABNORMAL HIGH (ref 6–20)
CO2: 22 mmol/L (ref 22–32)
Calcium: 9.9 mg/dL (ref 8.9–10.3)
Chloride: 86 mmol/L — ABNORMAL LOW (ref 98–111)
Creatinine, Ser: 1.56 mg/dL — ABNORMAL HIGH (ref 0.44–1.00)
GFR calc Af Amer: 42 mL/min — ABNORMAL LOW (ref >60)
GFR calc non Af Amer: 37 mL/min — ABNORMAL LOW (ref >60)
Glucose, Bld: 89 mg/dL (ref 70–99)
Potassium: 4.8 mmol/L (ref 3.5–5.1)
Sodium: 123 mmol/L — ABNORMAL LOW (ref 135–145)
Total Bilirubin: 0.9 mg/dL (ref 0.3–1.2)
Total Protein: 7.4 g/dL (ref 6.5–8.1)

## 2018-11-06 MED ORDER — FENTANYL CITRATE (PF) 100 MCG/2ML IJ SOLN
50.0000 ug | Freq: Once | INTRAMUSCULAR | Status: AC
  Administered 2018-11-06: 50 ug via INTRAVENOUS
  Filled 2018-11-06: qty 2

## 2018-11-06 MED ORDER — ACETAMINOPHEN 325 MG PO TABS: 650.0000 mg | ORAL_TABLET | Freq: Once | ORAL | Status: DC

## 2018-11-06 MED ORDER — SODIUM CHLORIDE 0.9 % IV BOLUS
1000.0000 mL | Freq: Once | INTRAVENOUS | Status: AC
  Administered 2018-11-06: 1000 mL via INTRAVENOUS

## 2018-11-06 NOTE — ED Notes (Signed)
Pt reports pain face, no facial droop. Skin intact

## 2018-11-06 NOTE — ED Notes (Signed)
Assumed care. Pt states unable to give urine sample at this time.

## 2018-11-06 NOTE — ED Notes (Signed)
Spoke with Curley at [facility] for bed status at [facility].  Awaiting call back

## 2018-11-06 NOTE — ED Notes (Signed)
Pt. Said she has fallen 3 times in last 24-36 hours due to dizziness all weekend.  Pt. Lives alone she said.    Pt. Said she drinks a beer or two every now and then.  Pt. Said she is use to drinking and has been drinking for years.

## 2018-11-06 NOTE — ED Notes (Signed)
Took Pt. To restroom with 3 assist.  Pt. Stated she had no dizziness. Pt. Is pleasant and expresses thankfulness and kindness for help.

## 2018-11-06 NOTE — ED Triage Notes (Signed)
Per EMS:  Pt has had numerous falls at home due to dizziness.  Pt is unsure if she had syncopal event.  Pt has admitted that she hasn't been eating.

## 2018-11-06 NOTE — ED Notes (Signed)
Patient states she took 650 of tylenol at 1600. Provider aware. Hold on medication at this time. No new orders.
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
## 2018-11-07 ENCOUNTER — Encounter (HOSPITAL_COMMUNITY): Payer: Self-pay | Admitting: General Practice

## 2018-11-07 DIAGNOSIS — E861 Hypovolemia: Secondary | ICD-10-CM | POA: Diagnosis present

## 2018-11-07 DIAGNOSIS — F329 Major depressive disorder, single episode, unspecified: Secondary | ICD-10-CM | POA: Diagnosis present

## 2018-11-07 DIAGNOSIS — Z79899 Other long term (current) drug therapy: Secondary | ICD-10-CM | POA: Diagnosis not present

## 2018-11-07 DIAGNOSIS — I951 Orthostatic hypotension: Secondary | ICD-10-CM | POA: Diagnosis present

## 2018-11-07 DIAGNOSIS — M2550 Pain in unspecified joint: Secondary | ICD-10-CM

## 2018-11-07 DIAGNOSIS — Z90710 Acquired absence of both cervix and uterus: Secondary | ICD-10-CM | POA: Diagnosis not present

## 2018-11-07 DIAGNOSIS — I959 Hypotension, unspecified: Secondary | ICD-10-CM

## 2018-11-07 DIAGNOSIS — C07 Malignant neoplasm of parotid gland: Secondary | ICD-10-CM | POA: Diagnosis present

## 2018-11-07 DIAGNOSIS — F17210 Nicotine dependence, cigarettes, uncomplicated: Secondary | ICD-10-CM | POA: Diagnosis present

## 2018-11-07 DIAGNOSIS — N179 Acute kidney failure, unspecified: Secondary | ICD-10-CM | POA: Diagnosis present

## 2018-11-07 DIAGNOSIS — E871 Hypo-osmolality and hyponatremia: Secondary | ICD-10-CM | POA: Diagnosis present

## 2018-11-07 DIAGNOSIS — E878 Other disorders of electrolyte and fluid balance, not elsewhere classified: Secondary | ICD-10-CM | POA: Diagnosis present

## 2018-11-07 DIAGNOSIS — I472 Ventricular tachycardia: Secondary | ICD-10-CM | POA: Diagnosis not present

## 2018-11-07 DIAGNOSIS — I10 Essential (primary) hypertension: Secondary | ICD-10-CM | POA: Diagnosis present

## 2018-11-07 DIAGNOSIS — R296 Repeated falls: Secondary | ICD-10-CM | POA: Diagnosis present

## 2018-11-07 DIAGNOSIS — M19011 Primary osteoarthritis, right shoulder: Secondary | ICD-10-CM | POA: Diagnosis present

## 2018-11-07 DIAGNOSIS — Z21 Asymptomatic human immunodeficiency virus [HIV] infection status: Secondary | ICD-10-CM | POA: Diagnosis present

## 2018-11-07 DIAGNOSIS — M4856XA Collapsed vertebra, not elsewhere classified, lumbar region, initial encounter for fracture: Secondary | ICD-10-CM | POA: Diagnosis present

## 2018-11-07 DIAGNOSIS — C7951 Secondary malignant neoplasm of bone: Secondary | ICD-10-CM | POA: Diagnosis present

## 2018-11-07 DIAGNOSIS — K5900 Constipation, unspecified: Secondary | ICD-10-CM | POA: Diagnosis present

## 2018-11-07 DIAGNOSIS — Z20828 Contact with and (suspected) exposure to other viral communicable diseases: Secondary | ICD-10-CM | POA: Diagnosis present

## 2018-11-07 DIAGNOSIS — E860 Dehydration: Secondary | ICD-10-CM | POA: Diagnosis present

## 2018-11-07 LAB — BASIC METABOLIC PANEL
Anion gap: 13 (ref 5–15)
Anion gap: 9 (ref 5–15)
BUN: 28 mg/dL — ABNORMAL HIGH (ref 6–20)
BUN: 20 mg/dL (ref 6–20)
CO2: 19 mmol/L — ABNORMAL LOW (ref 22–32)
CO2: 28 mmol/L (ref 22–32)
Calcium: 9.4 mg/dL (ref 8.9–10.3)
Calcium: 9.4 mg/dL (ref 8.9–10.3)
Chloride: 94 mmol/L — ABNORMAL LOW (ref 98–111)
Chloride: 92 mmol/L — ABNORMAL LOW (ref 98–111)
Creatinine, Ser: 0.69 mg/dL (ref 0.44–1.00)
Creatinine, Ser: 0.68 mg/dL (ref 0.44–1.00)
GFR calc Af Amer: >60 mL/min (ref >60)
GFR calc Af Amer: >60 mL/min (ref >60)
GFR calc non Af Amer: >60 mL/min (ref >60)
GFR calc non Af Amer: >60 mL/min (ref >60)
Glucose, Bld: 80 mg/dL (ref 70–99)
Glucose, Bld: 76 mg/dL (ref 70–99)
Potassium: 4.1 mmol/L (ref 3.5–5.1)
Potassium: 4.3 mmol/L (ref 3.5–5.1)
Sodium: 126 mmol/L — ABNORMAL LOW (ref 135–145)
Sodium: 129 mmol/L — ABNORMAL LOW (ref 135–145)

## 2018-11-07 LAB — CBC
HCT: 32.5 % — ABNORMAL LOW (ref 36.0–46.0)
Hemoglobin: 11.3 g/dL — ABNORMAL LOW (ref 12.0–15.0)
MCH: 24.1 pg — ABNORMAL LOW (ref 26.0–34.0)
MCHC: 34.8 g/dL (ref 30.0–36.0)
MCV: 69.4 fL — ABNORMAL LOW (ref 80.0–100.0)
Platelets: 176 10*3/uL (ref 150–400)
RBC: 4.68 MIL/uL (ref 3.87–5.11)
RDW: 14.4 % (ref 11.5–15.5)
WBC: 5.8 10*3/uL (ref 4.0–10.5)
nRBC: 0 % (ref 0.0–0.2)

## 2018-11-07 LAB — URINALYSIS, ROUTINE W REFLEX MICROSCOPIC
Bilirubin Urine: NEGATIVE
Glucose, UA: NEGATIVE mg/dL
Hgb urine dipstick: NEGATIVE
Ketones, ur: NEGATIVE mg/dL
Leukocytes,Ua: NEGATIVE
Nitrite: NEGATIVE
Protein, ur: NEGATIVE mg/dL
Specific Gravity, Urine: 1.02 (ref 1.005–1.030)
pH: 5.5 (ref 5.0–8.0)

## 2018-11-07 LAB — OSMOLALITY, URINE: Osmolality, Ur: 359 mOsm/kg (ref 300–900)

## 2018-11-07 LAB — SARS CORONAVIRUS 2 BY RT PCR (HOSPITAL ORDER, PERFORMED IN ~~LOC~~ HOSPITAL LAB): SARS Coronavirus 2: NEGATIVE

## 2018-11-07 LAB — CK: Total CK: 55 U/L (ref 38–234)

## 2018-11-07 MED ORDER — METHOCARBAMOL 500 MG PO TABS: 750.0000 mg | ORAL_TABLET | Freq: Four times a day (QID) | ORAL | Status: DC | PRN

## 2018-11-07 MED ORDER — ACETAMINOPHEN 325 MG PO TABS: 650.0000 mg | ORAL_TABLET | Freq: Four times a day (QID) | ORAL | Status: DC | PRN

## 2018-11-07 MED ORDER — ONDANSETRON HCL 4 MG PO TABS: 4.0000 mg | ORAL_TABLET | Freq: Four times a day (QID) | ORAL | Status: DC | PRN

## 2018-11-07 MED ORDER — ACETAMINOPHEN 650 MG RE SUPP: 650.0000 mg | Freq: Four times a day (QID) | RECTAL | Status: DC | PRN

## 2018-11-07 MED ORDER — SODIUM CHLORIDE 0.9 % IV BOLUS
1000.0000 mL | INTRAVENOUS | Status: AC
  Administered 2018-11-07: 1000 mL via INTRAVENOUS

## 2018-11-07 MED ORDER — MAGNESIUM HYDROXIDE 400 MG/5ML PO SUSP
5.0000 mL | Freq: Every day | ORAL | Status: DC
  Administered 2018-11-07 – 2018-11-09 (×3): 5 mL via ORAL
  Filled 2018-11-07 (×3): qty 30

## 2018-11-07 MED ORDER — GABAPENTIN 300 MG PO CAPS
300.0000 mg | ORAL_CAPSULE | Freq: Three times a day (TID) | ORAL | Status: DC
  Administered 2018-11-07 – 2018-11-08 (×4): 300 mg via ORAL
  Filled 2018-11-07 (×6): qty 1

## 2018-11-07 MED ORDER — EMTRICITABINE-TENOFOVIR AF 200-25 MG PO TABS
1.0000 | ORAL_TABLET | Freq: Every day | ORAL | Status: DC
  Administered 2018-11-07 – 2018-11-08 (×2): 1 via ORAL
  Filled 2018-11-07 (×2): qty 1

## 2018-11-07 MED ORDER — SODIUM CHLORIDE 0.9 % IV SOLN
INTRAVENOUS | Status: DC
  Administered 2018-11-07 – 2018-11-08 (×2): via INTRAVENOUS

## 2018-11-07 MED ORDER — SODIUM CHLORIDE 0.9% FLUSH
3.0000 mL | Freq: Two times a day (BID) | INTRAVENOUS | Status: DC
  Administered 2018-11-07 – 2018-11-08 (×2): 3 mL via INTRAVENOUS

## 2018-11-07 MED ORDER — ONDANSETRON HCL 4 MG/2ML IJ SOLN
4.0000 mg | Freq: Four times a day (QID) | INTRAMUSCULAR | Status: DC | PRN
  Administered 2018-11-08: 4 mg via INTRAVENOUS
  Filled 2018-11-07: qty 2

## 2018-11-07 MED ORDER — CYCLOBENZAPRINE HCL 5 MG PO TABS: 5.0000 mg | ORAL_TABLET | Freq: Three times a day (TID) | ORAL | Status: DC | PRN

## 2018-11-07 MED ORDER — CITALOPRAM HYDROBROMIDE 10 MG PO TABS
10.0000 mg | ORAL_TABLET | Freq: Every day | ORAL | Status: DC
  Administered 2018-11-07 – 2018-11-09 (×3): 10 mg via ORAL
  Filled 2018-11-07 (×3): qty 1

## 2018-11-07 MED ORDER — ALBUTEROL SULFATE (2.5 MG/3ML) 0.083% IN NEBU: 2.5000 mg | INHALATION_SOLUTION | Freq: Four times a day (QID) | RESPIRATORY_TRACT | Status: DC | PRN

## 2018-11-07 MED ORDER — OXYCODONE-ACETAMINOPHEN 5-325 MG PO TABS
1.0000 | ORAL_TABLET | Freq: Four times a day (QID) | ORAL | Status: DC | PRN
  Administered 2018-11-07 – 2018-11-09 (×5): 2 via ORAL
  Filled 2018-11-07 (×5): qty 2

## 2018-11-07 MED ORDER — ENOXAPARIN SODIUM 40 MG/0.4ML ~~LOC~~ SOLN
40.0000 mg | SUBCUTANEOUS | Status: DC
  Administered 2018-11-07 – 2018-11-09 (×3): 40 mg via SUBCUTANEOUS
  Filled 2018-11-07 (×3): qty 0.4

## 2018-11-07 MED ORDER — ENSURE ENLIVE PO LIQD
237.0000 mL | Freq: Two times a day (BID) | ORAL | Status: DC
  Administered 2018-11-07 (×2): 237 mL via ORAL

## 2018-11-07 NOTE — ED Notes (Signed)
Attempted an unsuccessful blood draw from patient. Patient states " I'm not letting ya'll stick me no more tonight."

## 2018-11-07 NOTE — ED Notes (Signed)
Pt up to bedside commode with assist. 460ml clear urine.

## 2018-11-07 NOTE — Progress Notes (Signed)
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   GOAL:   Patient will meet greater than or equal to 90% of their needs   MONITOR:   PO intake, Labs, I & O's, Supplement acceptance, Weight trends  REASON FOR ASSESSMENT:   Malnutrition Screening Tool    ASSESSMENT:  RD working remotely.  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 Patient treated in ED with IVF for dehydration. Patient requested admission to [facility] or [facility] due to proximity of her home and followed by [facility] oncology. Due to bed unavailability, pt admitted to [facility] with acute renal failure.  Patient reports last meal was Wednesday, and felt nauseous after eating. She stated that "everytime she thinks about food she gets a sick feeling and so I just don't eat" This morning she said she had a little bit of breakfast before feeling full. Patient recalls most of [place] steak for lunch and stated "what I really wanted was a cheeseburger, but it was pretty good"   Patient reports no BM and has requested Miralax. Suspect poor po and early satiety related to bowel irregularity. RD will continue to monitor for bowel regimen.  Current wt 65.7 kg (144.5 lbs)  UBW 154 lbs - a few weeks ago per pt 9.5 lb (6.2%) loss x 1 month; severe for time frame  No recent wt history for review; last weight 96.2 kg taken in 2015  Medications and labs  reviewed  NUTRITION - FOCUSED PHYSICAL EXAM: Unable to complete at this time   Diet Order:   Diet Order            Diet regular Room service appropriate? Yes; Fluid consistency: Thin  Diet effective now              EDUCATION NEEDS:   No education needs have been identified at this time  Skin:  Skin Assessment: Reviewed RN Assessment  Last BM:  7/29  Height:   Ht Readings from Last 1 Encounters:  11/07/18 5\' 4"  (1.626 m)    Weight:   Wt Readings from Last 1 Encounters:  11/07/18 65.7 kg    Ideal Body Weight:  54.5 kg  BMI:  Body mass index is 24.87 kg/m.  Estimated Nutritional Needs:   Kcal:  1950-2150  Protein:  98-108 grams  Fluid:  >1.9L   Lajuan Lines, RD, LDN [facility] Telephone [phone] After Hours/Weekend Pager: [phone]

## 2018-11-07 NOTE — ED Notes (Signed)
ED TO INPATIENT HANDOFF REPORT  ED Nurse Name and Phone #: Marisa Hua RN [phone]  S Name/Age/Gender Kristen Roman 58 y.o. female Room/Bed: MH07/MH07  Code Status   Code Status: Not on file  Home/SNF/Other Home Patient oriented to: self, place, time and situation Is this baseline? Yes   Triage Complete: Triage complete  Chief Complaint No admission diagnoses are documented for this encounter.  Triage Note Per EMS:  Pt has had numerous falls at home due to dizziness.  Pt is unsure if she had syncopal event.  Pt has admitted that she hasn't been eating.    Allergies Allergies  Allergen Reactions  . Amoxicillin Itching  . Penicillin G Itching    Level of Care/Admitting Diagnosis ED Disposition    ED Disposition Condition Comment   Admit  Hospital Area: [facility] [[id number]]  Level of Care: Telemetry Medical [104]  I expect the patient will be discharged within 24 hours: No (not a candidate for 5C-Observation unit)  Covid Evaluation: Asymptomatic Screening Protocol (No Symptoms)  Diagnosis: ARF (acute renal failure) ([place]) [010932]  Admitting Physician: Rise Patience [phone]  Attending Physician: Rise Patience [email or web address]  PT Class (Do Not Modify): Observation [104]  PT Acc Code (Do Not Modify): Observation [10022]       B Medical/Surgery History Past Medical History:  Diagnosis Date  . Cancer ([place])    "bone cancer"  . Hemorrhoid   . HIV disease ([place])   . Hypertension    Past Surgical History:  Procedure Laterality Date  . ABDOMINAL HYSTERECTOMY    . CESAREAN SECTION       A IV Location/Drains/Wounds Patient Lines/Drains/Airways Status   Active Line/Drains/Airways    Name:   Placement date:   Placement time:   Site:   Days:   Peripheral IV 11/06/18 Right Hand   11/06/18    1900    Hand   1          Intake/Output Last 24 hours No intake or output data in the 24 hours ending 11/07/18 0252  Labs/Imaging Results for  orders placed or performed during the hospital encounter of 11/06/18 (from the past 48 hour(s))  CBC with Differential     Status: Abnormal   Collection Time: 11/06/18  8:05 PM  Result Value Ref Range   WBC 6.8 4.0 - 10.5 K/uL   RBC 5.51 (H) 3.87 - 5.11 MIL/uL   Hemoglobin 12.9 12.0 - 15.0 g/dL   HCT 38.9 36.0 - 46.0 %   MCV 70.6 (L) 80.0 - 100.0 fL   MCH 23.4 (L) 26.0 - 34.0 pg   MCHC 33.2 30.0 - 36.0 g/dL   RDW 14.6 11.5 - 15.5 %   Platelets 199 150 - 400 K/uL   nRBC 0.0 0.0 - 0.2 %   Neutrophils Relative % 49 %   Neutro Abs 3.3 1.7 - 7.7 K/uL   Lymphocytes Relative 33 %   Lymphs Abs 2.2 0.7 - 4.0 K/uL   Monocytes Relative 14 %   Monocytes Absolute 1.0 0.1 - 1.0 K/uL   Eosinophils Relative 0 %   Eosinophils Absolute 0.0 0.0 - 0.5 K/uL   Basophils Relative 0 %   Basophils Absolute 0.0 0.0 - 0.1 K/uL   Immature Granulocytes 4 %   Abs Immature Granulocytes 0.25 (H) 0.00 - 0.07 K/uL    Comment: Performed at [facility], [address] [place], [place] [address]  Comprehensive metabolic panel  Status: Abnormal   Collection Time: 11/06/18  8:05 PM  Result Value Ref Range   Sodium 123 (L) 135 - 145 mmol/L   Potassium 4.8 3.5 - 5.1 mmol/L    Comment: MODERATE HEMOLYSIS HEMOLYSIS AT THIS LEVEL MAY AFFECT RESULT    Chloride 86 (L) 98 - 111 mmol/L   CO2 22 22 - 32 mmol/L   Glucose, Bld 89 70 - 99 mg/dL   BUN 42 (H) 6 - 20 mg/dL   Creatinine, Ser 1.56 (H) 0.44 - 1.00 mg/dL   Calcium 9.9 8.9 - 10.3 mg/dL   Total Protein 7.4 6.5 - 8.1 g/dL   Albumin 3.4 (L) 3.5 - 5.0 g/dL   AST 38 15 - 41 U/L   ALT 14 0 - 44 U/L   Alkaline Phosphatase 111 38 - 126 U/L   Total Bilirubin 0.9 0.3 - 1.2 mg/dL   GFR calc non Af Amer 37 (L) >60 mL/min   GFR calc Af Amer 42 (L) >60 mL/min   Anion gap 15 5 - 15    Comment: Performed at [facility], [address] [place], [place] [address]  Urinalysis, Routine w reflex microscopic     Status: None   Collection Time:  11/06/18 11:50 PM  Result Value Ref Range   Color, Urine YELLOW YELLOW   APPearance CLEAR CLEAR   Specific Gravity, Urine 1.020 1.005 - 1.030   pH 5.5 5.0 - 8.0   Glucose, UA NEGATIVE NEGATIVE mg/dL   Hgb urine dipstick NEGATIVE NEGATIVE   Bilirubin Urine NEGATIVE NEGATIVE   Ketones, ur NEGATIVE NEGATIVE mg/dL   Protein, ur NEGATIVE NEGATIVE mg/dL   Nitrite NEGATIVE NEGATIVE   Leukocytes,Ua NEGATIVE NEGATIVE    Comment: Microscopic not done on urines with negative protein, blood, leukocytes, nitrite, or glucose < 500 mg/dL. Performed at [facility], [address] [place], [place] [address]   SARS Coronavirus 2 [facility] order, Performed in [facility] hospital lab) Nasopharyngeal Nasopharyngeal Swab     Status: None   Collection Time: 11/07/18 12:01 AM   Specimen: Nasopharyngeal Swab  Result Value Ref Range   SARS Coronavirus 2 NEGATIVE NEGATIVE    Comment: (NOTE) If result is NEGATIVE SARS-CoV-2 target nucleic acids are NOT DETECTED. The SARS-CoV-2 RNA is generally detectable in upper and lower  respiratory specimens during the acute phase of infection. The lowest  concentration of SARS-CoV-2 viral copies this assay can detect is 250  copies / mL. A negative result does not preclude SARS-CoV-2 infection  and should not be used as the sole basis for treatment or other  patient management decisions.  A negative result may occur with  improper specimen collection / handling, submission of specimen other  than nasopharyngeal swab, presence of viral mutation(s) within the  areas targeted by this assay, and inadequate number of viral copies  (<250 copies / mL). A negative result must be combined with clinical  observations, patient history, and epidemiological information. If result is POSITIVE SARS-CoV-2 target nucleic acids are DETECTED. The SARS-CoV-2 RNA is generally detectable in upper and lower  respiratory specimens dur ing the acute phase of infection.   Positive  results are indicative of active infection with SARS-CoV-2.  Clinical  correlation with patient history and other diagnostic information is  necessary to determine patient infection status.  Positive results do  not rule out bacterial infection or co-infection with other viruses. If result is PRESUMPTIVE POSTIVE SARS-CoV-2 nucleic acids MAY BE PRESENT.  A presumptive positive result was obtained on the submitted specimen  and confirmed on repeat testing.  While 2019 novel coronavirus  (SARS-CoV-2) nucleic acids may be present in the submitted sample  additional confirmatory testing may be necessary for epidemiological  and / or clinical management purposes  to differentiate between  SARS-CoV-2 and other Sarbecovirus currently known to infect humans.  If clinically indicated additional testing with an alternate test  methodology [phone]) is advised. The SARS-CoV-2 RNA is generally  detectable in upper and lower respiratory sp ecimens during the acute  phase of infection. The expected result is Negative. Fact Sheet for Patients:  StrictlyIdeas.no Fact Sheet for Healthcare Providers: BankingDealers.co.za This test is not yet approved or cleared by the [place] FDA and has been authorized for detection and/or diagnosis of SARS-CoV-2 by FDA under an Emergency Use Authorization (EUA).  This EUA will remain in effect (meaning this test can be used) for the duration of the COVID-19 declaration under Section 564(b)(1) of the Act, 21 U.S.C. section 360bbb-3(b)(1), unless the authorization is terminated or revoked sooner. Performed at [facility], [address] [place], [place] [address]    Dg Thoracic Spine 2 View  Result Date: 11/06/2018 CLINICAL DATA:  Multiple falls EXAM: THORACIC SPINE 2 VIEWS COMPARISON:  Chest x-ray 09/08/2009 FINDINGS: Thoracic alignment within normal limits. Imaged vertebral bodies  demonstrate normal stature. Minimal degenerative osteophytes. IMPRESSION: No acute osseous abnormality Electronically Signed   By: Donavan Foil M.D.   On: 11/06/2018 22:53   Dg Lumbar Spine Complete  Result Date: 11/06/2018 CLINICAL DATA:  Numerous falls EXAM: LUMBAR SPINE - COMPLETE 4+ VIEW COMPARISON:  12/04/2015 FINDINGS: Lumbar alignment within normal limits. Mild compression deformity at L2, uncertain age. Mild degenerative changes L1-L2, L2-L3, L4-L5 IMPRESSION: Mild compression deformity L2 uncertain age. Scattered degenerative changes within the lumbar spine. Electronically Signed   By: Donavan Foil M.D.   On: 11/06/2018 22:51   Dg Shoulder Right  Result Date: 11/06/2018 CLINICAL DATA:  Shoulder pain, fall EXAM: RIGHT SHOULDER - 2+ VIEW COMPARISON:  None. FINDINGS: No fracture or malalignment. Advanced degenerative change of the glenohumeral interval with joint space narrowing and bulky inferior osteophyte. Right lung apex is clear IMPRESSION: No acute osseous abnormality. Advanced arthritis of the right shoulder Electronically Signed   By: Donavan Foil M.D.   On: 11/06/2018 22:52   Ct Head Wo Contrast  Result Date: 11/06/2018 CLINICAL DATA:  Numerous falls, headache EXAM: CT HEAD WITHOUT CONTRAST CT CERVICAL SPINE WITHOUT CONTRAST TECHNIQUE: Multidetector CT imaging of the head and cervical spine was performed following the standard protocol without intravenous contrast. Multiplanar CT image reconstructions of the cervical spine were also generated. COMPARISON:  September 20, 2018 FINDINGS: CT HEAD FINDINGS Brain: No acute intracranial abnormality. Specifically, no hemorrhage, hydrocephalus, mass lesion, acute infarction, or significant intracranial injury. Vascular: No hyperdense vessel or unexpected calcification. Skull: No acute calvarial abnormality. Sinuses/Orbits: Visualized paranasal sinuses and mastoids clear. Orbital soft tissues unremarkable. Other: None CT CERVICAL SPINE FINDINGS  Alignment: Normal Skull base and vertebrae: No acute fracture. No primary bone lesion or focal pathologic process. Soft tissues and spinal canal: No prevertebral fluid or swelling. No visible canal hematoma. Disc levels:  Disc space narrowing and spurring at C5-6 and C6-7. Upper chest: Negative Other: None IMPRESSION: No acute intracranial abnormality. No acute bony abnormality in the cervical spine. Electronically Signed   By: Rolm Baptise M.D.   On: 11/06/2018 23:21   Ct Cervical Spine Wo Contrast  Result Date: 11/06/2018 CLINICAL DATA:  Numerous falls, headache EXAM: CT HEAD WITHOUT CONTRAST CT CERVICAL SPINE WITHOUT CONTRAST TECHNIQUE: Multidetector CT imaging of the head and cervical spine was performed following the standard protocol without intravenous contrast. Multiplanar CT image reconstructions of the cervical spine were also generated. COMPARISON:  September 20, 2018 FINDINGS: CT HEAD FINDINGS Brain: No acute intracranial abnormality. Specifically, no hemorrhage, hydrocephalus, mass lesion, acute infarction, or significant intracranial injury. Vascular: No hyperdense vessel or unexpected calcification. Skull: No acute calvarial abnormality. Sinuses/Orbits: Visualized paranasal sinuses and mastoids clear. Orbital soft tissues unremarkable. Other: None CT CERVICAL SPINE FINDINGS Alignment: Normal Skull base and vertebrae: No acute fracture. No primary bone lesion or focal pathologic process. Soft tissues and spinal canal: No prevertebral fluid or swelling. No visible canal hematoma. Disc levels:  Disc space narrowing and spurring at C5-6 and C6-7. Upper chest: Negative Other: None IMPRESSION: No acute intracranial abnormality. No acute bony abnormality in the cervical spine. Electronically Signed   By: Rolm Baptise M.D.   On: 11/06/2018 23:21   Dg Chest Portable 1 View  Result Date: 11/06/2018 CLINICAL DATA:  Cough, multiple falls at home, possible syncopal event, right shoulder hip and back pain. EXAM:  PORTABLE CHEST 1 VIEW COMPARISON:  Chest radiograph September 20, 2018 FINDINGS: No consolidation, features of edema, pneumothorax, or effusion. Pulmonary vascularity is normally distributed. Tortuous brachiocephalic vasculature. The cardiomediastinal contours are unremarkable. No acute osseous or soft tissue abnormality. Extensive osteoarthrosis of the right shoulder. Mild degenerative changes in the thoracic spine. Chest it has fracture IMPRESSION: No acute cardiopulmonary abnormality. Degenerative changes in the right shoulder. Electronically Signed   By: Lovena Le M.D.   On: 11/06/2018 22:44   Dg Hip Unilat With Pelvis 2-3 Views Right  Result Date: 11/06/2018 CLINICAL DATA:  Multiple falls EXAM: DG HIP (WITH OR WITHOUT PELVIS) 2-3V RIGHT COMPARISON:  None. FINDINGS: SI joints are non widened. Pubic symphysis is intact. Age indeterminate fracture deformity right inferior pubic ramus. Right femoral head projects in joint. Heterogenous mineralization of the right ischium and inferior pubic ramus. IMPRESSION: 1. Minimal deformity of the right inferior pubic ramus suggest age indeterminate fracture 2. Heterogenous mineralization of the right ischium and inferior pubic ramus, raises possibility of marrow infiltrative process. Electronically Signed   By: Donavan Foil M.D.   On: 11/06/2018 22:55    Pending Labs Unresulted Labs (From admission, onward)    Start     Ordered   11/07/18 0006  Osmolality  ONCE - STAT,   STAT     11/07/18 0005   11/07/18 0005  Osmolality, urine  Once,   STAT     11/07/18 0005          Vitals/Pain Today's Vitals   11/07/18 0104 11/07/18 0131 11/07/18 0228 11/07/18 0247  BP:   (!) 90/57 105/76  Pulse:   95 99  Resp:   19 (!) 21  Temp:   98.6 F (37 C) 98.2 F (36.8 C)  TempSrc:   Oral   SpO2:   98% 99%  Weight:      Height:      PainSc: 4  Asleep  2     Isolation Precautions No active isolations  Medications Medications  acetaminophen (TYLENOL) tablet 650  mg (0 mg Oral Hold 11/06/18 1959)  sodium chloride 0.9 % bolus 1,000 mL (0 mLs Intravenous Stopped 11/06/18 2308)  fentaNYL (SUBLIMAZE) injection 50 mcg (50 mcg Intravenous Given 11/06/18 2347)    Mobility walks with person  assist High fall risk   Focused Assessments Cardiac Assessment Handoff:  Cardiac Rhythm: Sinus tachycardia No results found for: CKTOTAL, CKMB, CKMBINDEX, TROPONINI No results found for: DDIMER Does the Patient currently have chest pain? No      R Recommendations: See Admitting Provider Note  Report given to:   Additional Notes: PT lives alone at home, states she does not take her medications as prescribed.

## 2018-11-07 NOTE — ED Notes (Signed)
[facility] notified (Tara) - patient ready for transport 

## 2018-11-07 NOTE — ED Notes (Signed)
Report to [facility]. ETA 15 minutes  

## 2018-11-07 NOTE — H&P (Addendum)
History and Physical    Kristen Roman KTG:[id number] DOB: 1961-02-15 DOA: 11/06/2018  Referring MD/NP/PA: Gean Birchwood, MD PCP: [place]  Patient coming from:  Home via EMS to [facility] transfer  Chief Complaint: Dizziness and fall  I have personally briefly reviewed patient's old medical records in [place]   HPI: Kristen Roman is a 58 y.o. 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  She reports feeling dizzy all weekend and reports falling on 3 separate occasions at home.  Denies any loss of consciousness or trauma to her head.  She landed on her right side each time and complained to the whole right side feeling sore.  Complains of having no appetite with decreased p.o. intake over the last 6 days.  Denies having any significant fever, chills nausea, vomiting, or diarrhea symptoms.  Associated symptoms include constipation with last bowel movement last week and muscle spasms in both legs.  Patient was last hospitalized at [facility] in June for right-sided facial swelling and weakness.  Found to have a parotid mass with extension into the skull base and multiple lytic lesions without signs of a stroke.  Tissue samples revealed adenocarcinoma.  Since that time she reports that she has had weakness on the right side of her face with pins and needle sensation.  Since that hospitalization she reports that she has had a difficult time ambulating.  At baseline patient lives alone and did not require assistive devices.  She has a follow-up appointment with hematology at [facility] on August 11.   ED Course: Upon admission into the emergency department patient was noted to be afebrile, pulse 86-102, respirations 16-23, blood pressures 85/52-130 1/98, and O2 saturations maintained on  room air.  Labs significant for sodium 123, BUN 42, and creatinine 1.56.  COVID-19 screening negative.  Urinalysis no acute abnormalities.  Imaging studies including CT of the cervical spine and head were negative for any acute abnormalities.  X-rays of the chest, lumbar, thoracic spine, and right shoulder revealed advanced arthritis and a mild L2 compression fracture of undetermined age.  Patient had been given 1 L of normal saline IV fluids, Tylenol, and 50 mcg of fentanyl prior to being transported to a telemetry bed here at [facility].  Review of Systems  Constitutional: Positive for malaise/fatigue. Negative for chills and fever.  HENT: Negative for ear discharge and nosebleeds.   Eyes: Negative for photophobia and pain.  Respiratory: Negative for cough, sputum production and shortness of breath.   Cardiovascular: Negative for chest pain and leg swelling.  Gastrointestinal: Positive for constipation. Negative for abdominal pain, diarrhea, nausea and vomiting.  Genitourinary: Negative for dysuria, frequency and hematuria.  Musculoskeletal: Positive for falls, joint pain and myalgias.  Skin: Negative for itching and rash.  Neurological: Positive for dizziness, sensory change and focal weakness. Negative for loss of consciousness.  Psychiatric/Behavioral: Negative for memory loss and substance abuse.    Past Medical History:  Diagnosis Date   Cancer [facility])    "bone cancer"   Hemorrhoid    HIV disease ([place])    Hypertension     Past Surgical History:  Procedure Laterality Date   ABDOMINAL HYSTERECTOMY     CESAREAN SECTION       reports that she has been smoking cigarettes. She started smoking about 43 years ago. She  has a 40.00 pack-year smoking history. She has never used smokeless tobacco. She reports current alcohol use of about 100.0 standard drinks of alcohol per week. She reports current drug use. Frequency: 3.00 times per week. Drug: Marijuana.  Allergies  Allergen  Reactions   Amoxicillin Itching   Penicillin G Itching    Family History  Problem Relation Age of Onset   Kidney disease Mother     Prior to Admission medications   Medication Sig Start Date End Date Taking? Authorizing Provider  acetaminophen (TYLENOL) 500 MG tablet Take 1,000 mg by mouth every 6 (six) hours as needed. Patient used this medication for pain.    [provider]  Aspirin-Acetaminophen-Caffeine (GOODY HEADACHE PO) Take 1 packet by mouth daily as needed. Patient states that she uses this medication for every pain that she has.    [provider]  citalopram (CELEXA) 10 MG tablet TAKE 1 TABLET BY MOUTH EVERY DAY 02/21/14   Carlyle Basques, MD  citalopram (CELEXA) 10 MG tablet TAKE 1 TABLET BY MOUTH DAILY 03/27/14   Comer, Okey Regal, MD  cyclobenzaprine (FLEXERIL) 10 MG tablet Take 1 tablet (10 mg total) by mouth 3 (three) times daily as needed for muscle spasms. 06/14/13   Carlyle Basques, MD  gabapentin (NEURONTIN) 300 MG capsule Take 1 capsule (300 mg total) by mouth 3 (three) times daily. 10/23/18   Lajean Saver, MD  hydrOXYzine (VISTARIL) 25 MG capsule Take 1 capsule (25 mg total) by mouth 3 (three) times daily as needed. 02/07/13   Thayer Headings, MD  lisinopril-hydrochlorothiazide (PRINZIDE,ZESTORETIC) 10-12.5 MG per tablet TAKE 1 TABLET BY MOUTH EVERY DAY 11/02/13   Comer, Okey Regal, MD  lisinopril-hydrochlorothiazide (PRINZIDE,ZESTORETIC) 10-12.5 MG per tablet TAKE 1 TABLET BY MOUTH EVERY DAY 02/21/14   Carlyle Basques, MD  naproxen (NAPROSYN) 375 MG tablet Take 1 tablet (375 mg total) by mouth 2 (two) times daily with a meal. 01/02/13   Comer, Okey Regal, MD  NORVIR 100 MG TABS tablet TAKE 1 TABLET BY MOUTH EVERY DAY 12/21/13   Comer, Okey Regal, MD  NORVIR 100 MG TABS tablet TAKE 1 TABLET BY MOUTH DAILY 02/21/14   Carlyle Basques, MD  oxyCODONE-acetaminophen (PERCOCET/ROXICET) 5-325 MG tablet Take 1-2 tablets by mouth every 6 (six) hours as needed for severe  pain. 10/23/18   Lajean Saver, MD  PREZISTA 800 MG tablet TAKE 1 TABLET BY MOUTH EVERY DAY 12/21/13   Comer, Okey Regal, MD  PREZISTA 800 MG tablet TAKE 1 TABLET BY MOUTH EVERY DAY 02/21/14   Carlyle Basques, MD  TRUVADA 200-300 MG per tablet TAKE 1 TABLET BY MOUTH DAILY 12/21/13   Comer, Okey Regal, MD  TRUVADA 200-300 MG per tablet TAKE 1 TABLET BY MOUTH DAILY 02/21/14   Carlyle Basques, MD    Physical Exam:  Constitutional: Middle-aged female inNAD, calm, comfortable Vitals:   11/07/18 0228 11/07/18 0247 11/07/18 0506 11/07/18 0611  BP: (!) 90/57 105/76 97/64 (!) 85/52  Pulse: 95 99 91 86  Resp: 19 (!) 21 (!) 21 16  Temp: 98.6 F (37 C) 98.2 F (36.8 C)  98.3 F (36.8 C)  TempSrc: Oral   Oral  SpO2: 98% 99% 100% 95%  Weight:    65.7 kg  Height:    5\' 4"  (1.626 m)   Eyes: PERRL, lids and conjunctivae normal ENMT: Mucous membranes are moist.  Neck: normal, supple, no masses, no thyromegaly Respiratory: clear to auscultation bilaterally, no wheezing, no crackles. Normal respiratory effort. No accessory muscle use.  Cardiovascular: Regular rate and rhythm, no murmurs / rubs / gallops. No extremity edema. 2+ pedal pulses. No carotid bruits.  Abdomen: no tenderness, no masses palpated. No hepatosplenomegaly. Bowel sounds positive.  Musculoskeletal: no clubbing / cyanosis.  Tenderness to palpation of the right shoulder. Skin: no rashes, lesions, ulcers. No induration Neurologic: CN 2-12 grossly intact.  Facial drooping on the right side noted.  No focal weakness appreciated. Psychiatric: Normal judgment and insight. Alert and oriented x 3. Normal mood.     Labs on Admission: I have personally reviewed following labs and imaging studies  CBC: Recent Labs  Lab 11/06/18 2005  WBC 6.8  NEUTROABS 3.3  HGB 12.9  HCT 38.9  MCV 70.6*  PLT 702   Basic Metabolic Panel: Recent Labs  Lab 11/06/18 2005  NA 123*  K 4.8  CL 86*  CO2 22  GLUCOSE 89  BUN 42*  CREATININE 1.56*    CALCIUM 9.9   GFR: Estimated Creatinine Clearance: 37.1 mL/min (A) (by C-G formula based on SCr of 1.56 mg/dL (H)). Liver Function Tests: Recent Labs  Lab 11/06/18 2005  AST 38  ALT 14  ALKPHOS 111  BILITOT 0.9  PROT 7.4  ALBUMIN 3.4*   No results for input(s): LIPASE, AMYLASE in the last 168 hours. No results for input(s): AMMONIA in the last 168 hours. Coagulation Profile: No results for input(s): INR, PROTIME in the last 168 hours. Cardiac Enzymes: No results for input(s): CKTOTAL, CKMB, CKMBINDEX, TROPONINI in the last 168 hours. BNP (last 3 results) No results for input(s): PROBNP in the last 8760 hours. HbA1C: No results for input(s): HGBA1C in the last 72 hours. CBG: No results for input(s): GLUCAP in the last 168 hours. Lipid Profile: No results for input(s): CHOL, HDL, LDLCALC, TRIG, CHOLHDL, LDLDIRECT in the last 72 hours. Thyroid Function Tests: No results for input(s): TSH, T4TOTAL, FREET4, T3FREE, THYROIDAB in the last 72 hours. Anemia Panel: No results for input(s): VITAMINB12, FOLATE, FERRITIN, TIBC, IRON, RETICCTPCT in the last 72 hours. Urine analysis:    Component Value Date/Time   COLORURINE YELLOW 11/06/2018 2350   APPEARANCEUR CLEAR 11/06/2018 2350   LABSPEC 1.020 11/06/2018 2350   PHURINE 5.5 11/06/2018 2350   GLUCOSEU NEGATIVE 11/06/2018 2350   GLUCOSEU NEG mg/dL 12/29/2006 2001   HGBUR NEGATIVE 11/06/2018 2350   BILIRUBINUR NEGATIVE 11/06/2018 2350   KETONESUR NEGATIVE 11/06/2018 2350   PROTEINUR NEGATIVE 11/06/2018 2350   UROBILINOGEN 1 12/29/2006 2001   NITRITE NEGATIVE 11/06/2018 2350   LEUKOCYTESUR NEGATIVE 11/06/2018 2350   Sepsis Labs: Recent Results (from the past 240 hour(s))  SARS Coronavirus 2 [facility] order, Performed in [facility] hospital lab) Nasopharyngeal Nasopharyngeal Swab     Status: None   Collection Time: 11/07/18 12:01 AM   Specimen: Nasopharyngeal Swab  Result Value Ref Range Status   SARS Coronavirus 2  NEGATIVE NEGATIVE Final    Comment: (NOTE) If result is NEGATIVE SARS-CoV-2 target nucleic acids are NOT DETECTED. The SARS-CoV-2 RNA is generally detectable in upper and lower  respiratory specimens during the acute phase of infection. The lowest  concentration of SARS-CoV-2 viral copies this assay can detect is 250  copies / mL. A negative result does not preclude SARS-CoV-2 infection  and should not be used as the sole basis for treatment or other  patient management decisions.  A negative result may occur with  improper specimen collection / handling, submission of specimen other  than nasopharyngeal swab, presence of viral mutation(s) within the  areas  targeted by this assay, and inadequate number of viral copies  (<250 copies / mL). A negative result must be combined with clinical  observations, patient history, and epidemiological information. If result is POSITIVE SARS-CoV-2 target nucleic acids are DETECTED. The SARS-CoV-2 RNA is generally detectable in upper and lower  respiratory specimens dur ing the acute phase of infection.  Positive  results are indicative of active infection with SARS-CoV-2.  Clinical  correlation with patient history and other diagnostic information is  necessary to determine patient infection status.  Positive results do  not rule out bacterial infection or co-infection with other viruses. If result is PRESUMPTIVE POSTIVE SARS-CoV-2 nucleic acids MAY BE PRESENT.   A presumptive positive result was obtained on the submitted specimen  and confirmed on repeat testing.  While 2019 novel coronavirus  (SARS-CoV-2) nucleic acids may be present in the submitted sample  additional confirmatory testing may be necessary for epidemiological  and / or clinical management purposes  to differentiate between  SARS-CoV-2 and other Sarbecovirus currently known to infect humans.  If clinically indicated additional testing with an alternate test  methodology [phone])  is advised. The SARS-CoV-2 RNA is generally  detectable in upper and lower respiratory sp ecimens during the acute  phase of infection. The expected result is Negative. Fact Sheet for Patients:  StrictlyIdeas.no Fact Sheet for Healthcare Providers: BankingDealers.co.za This test is not yet approved or cleared by the [place] FDA and has been authorized for detection and/or diagnosis of SARS-CoV-2 by FDA under an Emergency Use Authorization (EUA).  This EUA will remain in effect (meaning this test can be used) for the duration of the COVID-19 declaration under Section 564(b)(1) of the Act, 21 U.S.C. section 360bbb-3(b)(1), unless the authorization is terminated or revoked sooner. Performed at [facility], [address] [place], [place] [address]      Radiological Exams on Admission: Dg Thoracic Spine 2 View  Result Date: 11/06/2018 CLINICAL DATA:  Multiple falls EXAM: THORACIC SPINE 2 VIEWS COMPARISON:  Chest x-ray 09/08/2009 FINDINGS: Thoracic alignment within normal limits. Imaged vertebral bodies demonstrate normal stature. Minimal degenerative osteophytes. IMPRESSION: No acute osseous abnormality Electronically Signed   By: Donavan Foil M.D.   On: 11/06/2018 22:53   Dg Lumbar Spine Complete  Result Date: 11/06/2018 CLINICAL DATA:  Numerous falls EXAM: LUMBAR SPINE - COMPLETE 4+ VIEW COMPARISON:  12/04/2015 FINDINGS: Lumbar alignment within normal limits. Mild compression deformity at L2, uncertain age. Mild degenerative changes L1-L2, L2-L3, L4-L5 IMPRESSION: Mild compression deformity L2 uncertain age. Scattered degenerative changes within the lumbar spine. Electronically Signed   By: Donavan Foil M.D.   On: 11/06/2018 22:51   Dg Shoulder Right  Result Date: 11/06/2018 CLINICAL DATA:  Shoulder pain, fall EXAM: RIGHT SHOULDER - 2+ VIEW COMPARISON:  None. FINDINGS: No fracture or malalignment. Advanced degenerative  change of the glenohumeral interval with joint space narrowing and bulky inferior osteophyte. Right lung apex is clear IMPRESSION: No acute osseous abnormality. Advanced arthritis of the right shoulder Electronically Signed   By: Donavan Foil M.D.   On: 11/06/2018 22:52   Ct Head Wo Contrast  Result Date: 11/06/2018 CLINICAL DATA:  Numerous falls, headache EXAM: CT HEAD WITHOUT CONTRAST CT CERVICAL SPINE WITHOUT CONTRAST TECHNIQUE: Multidetector CT imaging of the head and cervical spine was performed following the standard protocol without intravenous contrast. Multiplanar CT image reconstructions of the cervical spine were also generated. COMPARISON:  September 20, 2018 FINDINGS: CT HEAD FINDINGS Brain: No acute intracranial  abnormality. Specifically, no hemorrhage, hydrocephalus, mass lesion, acute infarction, or significant intracranial injury. Vascular: No hyperdense vessel or unexpected calcification. Skull: No acute calvarial abnormality. Sinuses/Orbits: Visualized paranasal sinuses and mastoids clear. Orbital soft tissues unremarkable. Other: None CT CERVICAL SPINE FINDINGS Alignment: Normal Skull base and vertebrae: No acute fracture. No primary bone lesion or focal pathologic process. Soft tissues and spinal canal: No prevertebral fluid or swelling. No visible canal hematoma. Disc levels:  Disc space narrowing and spurring at C5-6 and C6-7. Upper chest: Negative Other: None IMPRESSION: No acute intracranial abnormality. No acute bony abnormality in the cervical spine. Electronically Signed   By: Rolm Baptise M.D.   On: 11/06/2018 23:21   Ct Cervical Spine Wo Contrast  Result Date: 11/06/2018 CLINICAL DATA:  Numerous falls, headache EXAM: CT HEAD WITHOUT CONTRAST CT CERVICAL SPINE WITHOUT CONTRAST TECHNIQUE: Multidetector CT imaging of the head and cervical spine was performed following the standard protocol without intravenous contrast. Multiplanar CT image reconstructions of the cervical spine were also  generated. COMPARISON:  September 20, 2018 FINDINGS: CT HEAD FINDINGS Brain: No acute intracranial abnormality. Specifically, no hemorrhage, hydrocephalus, mass lesion, acute infarction, or significant intracranial injury. Vascular: No hyperdense vessel or unexpected calcification. Skull: No acute calvarial abnormality. Sinuses/Orbits: Visualized paranasal sinuses and mastoids clear. Orbital soft tissues unremarkable. Other: None CT CERVICAL SPINE FINDINGS Alignment: Normal Skull base and vertebrae: No acute fracture. No primary bone lesion or focal pathologic process. Soft tissues and spinal canal: No prevertebral fluid or swelling. No visible canal hematoma. Disc levels:  Disc space narrowing and spurring at C5-6 and C6-7. Upper chest: Negative Other: None IMPRESSION: No acute intracranial abnormality. No acute bony abnormality in the cervical spine. Electronically Signed   By: Rolm Baptise M.D.   On: 11/06/2018 23:21   Dg Chest Portable 1 View  Result Date: 11/06/2018 CLINICAL DATA:  Cough, multiple falls at home, possible syncopal event, right shoulder hip and back pain. EXAM: PORTABLE CHEST 1 VIEW COMPARISON:  Chest radiograph September 20, 2018 FINDINGS: No consolidation, features of edema, pneumothorax, or effusion. Pulmonary vascularity is normally distributed. Tortuous brachiocephalic vasculature. The cardiomediastinal contours are unremarkable. No acute osseous or soft tissue abnormality. Extensive osteoarthrosis of the right shoulder. Mild degenerative changes in the thoracic spine. Chest it has fracture IMPRESSION: No acute cardiopulmonary abnormality. Degenerative changes in the right shoulder. Electronically Signed   By: Lovena Le M.D.   On: 11/06/2018 22:44   Dg Hip Unilat With Pelvis 2-3 Views Right  Result Date: 11/06/2018 CLINICAL DATA:  Multiple falls EXAM: DG HIP (WITH OR WITHOUT PELVIS) 2-3V RIGHT COMPARISON:  None. FINDINGS: SI joints are non widened. Pubic symphysis is intact. Age indeterminate  fracture deformity right inferior pubic ramus. Right femoral head projects in joint. Heterogenous mineralization of the right ischium and inferior pubic ramus. IMPRESSION: 1. Minimal deformity of the right inferior pubic ramus suggest age indeterminate fracture 2. Heterogenous mineralization of the right ischium and inferior pubic ramus, raises possibility of marrow infiltrative process. Electronically Signed   By: Donavan Foil M.D.   On: 11/06/2018 22:55    EKG: Independently reviewed.  Sinus rhythm at 99 bpm with left atrial enlargement and abnormal R wave progression.  Noted    Assessment/Plan Acute kidney injury: Resolved.  Patient presents with creatinine elevated up to 1.56 with BUN 42.  Patient reports poor p.o. intake.  CT elevated BUN to creatinine ratio is also consistent with a prerenal cause of symptoms. -Admit to a medical telemetry bed -Strict intake  and output -Bolus 1 L of normal saline IV fluid, then placed on a rate of 100 mL/h as tolerated -Avoid nephrotoxic agents -Daily monitoring of kidney function(repeat creatinine 0.69 today)  Hyponatremia: Acute.  Sodium noted to be as low as 123.  Given history appears to be a hypovolemic hyponatremia. -Goal sodium to increase by no more than 10 mmol/L in a 24-hour. -Serial monitoring of BMP   -Adjust fluids as needed  Transient hypotension: Resolved.  Blood pressures noted to be as low as 85/52 and were responsive to IV fluids.  -IV fluids as seen above -Hold blood pressure medications of lisinopril hydrochlorothiazide due to AKI and hypotension -Restart when medically appropriate  Recurrent falls: Patient reported having current falls at home.  Orthostatic vital signs were positive on admission. -PT/OT to eval and treat -Check orthostatic vital signs -Continue IV fluids HIV: Currently on Truvada and is followed in outpatient setting by infectious disease at [facility].  In June of 2020 HIV RNA quantitation  7340, % uppercase lymphocytes 23.1, T-helper lymphocytes 0.46. -Continue pharmacy substitution current antiretroviral medication regimen -Continue outpatient follow-up with infectious disease at [facility]  Adenocarcinoma of the parotid gland: Diagnosed during hospitalization in June 2020 at [place] follow-up appointment scheduled for August 11  Depression  -Continue citalopram  Arthritis of the right shoulder and L2 compression fracture: Findings found on x-ray imaging. -Continue gabapentin, oxycodone as needed for pain  DVT prophylaxis: Lovenox Code Status: Full Family Communication: No family present at bedside Disposition Plan: Likely discharge home in 2 to 3 days Consults called: None Admission status: Inpatient  Norval Morton MD [facility] Pager [phone]   If 7PM-7AM, please contact night-coverage [email or web address] Password [facility]  11/07/2018, 7:35 AM

## 2018-11-08 ENCOUNTER — Inpatient Hospital Stay (HOSPITAL_COMMUNITY): Payer: Medicaid Other

## 2018-11-08 LAB — CBC
HCT: 32.5 % — ABNORMAL LOW (ref 36.0–46.0)
Hemoglobin: 10.7 g/dL — ABNORMAL LOW (ref 12.0–15.0)
MCH: 23.6 pg — ABNORMAL LOW (ref 26.0–34.0)
MCHC: 32.9 g/dL (ref 30.0–36.0)
MCV: 71.6 fL — ABNORMAL LOW (ref 80.0–100.0)
Platelets: 164 10*3/uL (ref 150–400)
RBC: 4.54 MIL/uL (ref 3.87–5.11)
RDW: 14.6 % (ref 11.5–15.5)
WBC: 3.3 10*3/uL — ABNORMAL LOW (ref 4.0–10.5)
nRBC: 0 % (ref 0.0–0.2)

## 2018-11-08 LAB — BASIC METABOLIC PANEL
Anion gap: 8 (ref 5–15)
BUN: 11 mg/dL (ref 6–20)
CO2: 26 mmol/L (ref 22–32)
Calcium: 9.2 mg/dL (ref 8.9–10.3)
Chloride: 97 mmol/L — ABNORMAL LOW (ref 98–111)
Creatinine, Ser: 0.46 mg/dL (ref 0.44–1.00)
GFR calc Af Amer: >60 mL/min (ref >60)
GFR calc non Af Amer: >60 mL/min (ref >60)
Glucose, Bld: 87 mg/dL (ref 70–99)
Potassium: 4.2 mmol/L (ref 3.5–5.1)
Sodium: 131 mmol/L — ABNORMAL LOW (ref 135–145)

## 2018-11-08 LAB — MAGNESIUM: Magnesium: 1.6 mg/dL — ABNORMAL LOW (ref 1.7–2.4)

## 2018-11-08 MED ORDER — MAGNESIUM SULFATE 2 GM/50ML IV SOLN
2.0000 g | Freq: Once | INTRAVENOUS | Status: AC
  Administered 2018-11-08: 2 g via INTRAVENOUS
  Filled 2018-11-08: qty 50

## 2018-11-08 MED ORDER — BICTEGRAVIR-EMTRICITAB-TENOFOV 50-200-25 MG PO TABS
1.0000 | ORAL_TABLET | Freq: Every day | ORAL | Status: DC
  Administered 2018-11-09: 1 via ORAL
  Filled 2018-11-08: qty 1

## 2018-11-08 MED ORDER — METOPROLOL TARTRATE 12.5 MG HALF TABLET
12.5000 mg | ORAL_TABLET | Freq: Two times a day (BID) | ORAL | Status: DC
  Administered 2018-11-08 – 2018-11-09 (×3): 12.5 mg via ORAL
  Filled 2018-11-08 (×3): qty 1

## 2018-11-08 MED ORDER — LACTATED RINGERS IV BOLUS
500.0000 mL | Freq: Once | INTRAVENOUS | Status: AC
  Administered 2018-11-08: 500 mL via INTRAVENOUS

## 2018-11-08 NOTE — Progress Notes (Signed)
Pt HR 67. Paged MD about metoprolol Awaiting to hear back. Will continue to monitor pt.

## 2018-11-08 NOTE — Progress Notes (Signed)
RN assisted patient to the bathroom and hat was in the toilet.  Patient missed the hat, RN asked patient to attempt to urinate in the hat next time and provided education on urine output documentation.

## 2018-11-08 NOTE — Evaluation (Addendum)
Physical Therapy Evaluation Patient Details Name: Kristen Roman MRN: [id number] DOB: 10-Jan-1961 Today's Date: 11/08/2018   History of Present Illness  Pt is a 58 y/o female presents with c/o dizziness and falls. Found with AKI, orthostatic hypotension, L2 compression fx. 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   Clinical Impression  Pt admitted with above diagnosis. Pt currently with functional limitations due to the deficits listed below (see PT Problem List).  Pt will benefit from skilled PT to increase their independence and safety with mobility to allow discharge to the venue listed below.  Pt moving well overall with rollator. She states her MD ordered her one prior to her being admitted to the hospital. If for some reason, she is unable to get rollator, then recommend a RW.  Will follow acutely, but do not feel she will need follow up PT at time of d/c.     Follow Up Recommendations No PT follow up    Equipment Recommendations  Other (comment)(rollator (which pt states her MD ordered prior to her admission) If she can't get that, then recommend RW)    Recommendations for Other Services       Precautions / Restrictions Precautions Precautions: Fall Precaution Comments: reviewed spinal precautions for comfort  Restrictions Weight Bearing Restrictions: No      Mobility  Bed Mobility Overal bed mobility: Needs Assistance Bed Mobility: Supine to Sit;Sit to Supine     Supine to sit: Supervision Sit to supine: Supervision   General bed mobility comments: sitting EOB upon arrival  Transfers Overall transfer level: Needs assistance Equipment used: 4-wheeled walker Transfers: Sit to/from Stand Sit to Stand: Supervision         General transfer comment: Pt instructed in use of rollator and brakes. Poor use of hand placement with sit to stand despite cueing. No c/o  dizziness.  Ambulation/Gait Ambulation/Gait assistance: Supervision;Min guard Gait Distance (Feet): 380 Feet Assistive device: 4-wheeled walker Gait Pattern/deviations: Step-through pattern;Antalgic     General Gait Details: pt denies pain with gait (pre-medicated), but does display limp. Progressed from min/guard to S.  Stairs            Wheelchair Mobility    Modified Rankin (Stroke Patients Only)       Balance Overall balance assessment: Needs assistance;History of Falls Sitting-balance support: No upper extremity supported;Feet supported Sitting balance-Leahy Scale: Good     Standing balance support: Bilateral upper extremity supported;No upper extremity supported;During functional activity Standing balance-Leahy Scale: Poor Standing balance comment: fair static and poor dynamic                             Pertinent Vitals/Pain Pain Assessment: 0-10 Faces Pain Scale: Hurts little more Pain Location: R sided soreness, lower back Pain Descriptors / Indicators: Discomfort;Sore Pain Intervention(s): Premedicated before session    [place] expects to be discharged to:: Private residence Living Arrangements: Alone Available Help at Discharge: Friend(s);Available PRN/intermittently Type of Home: Apartment Home Access: Level entry     Home Layout: One level Home Equipment: Cane - single point Additional Comments: she states that he MD has ordered her a rollator    Prior Function Level of Independence: Independent         Comments: independent, working, driving      Hand Dominance   Dominant Hand: Right    Extremity/Trunk Assessment   Upper Extremity Assessment Upper Extremity Assessment:  Defer to OT evaluation    Lower Extremity Assessment Lower Extremity Assessment: Generalized weakness       Communication   Communication: No difficulties  Cognition Arousal/Alertness: Awake/alert Behavior During Therapy: WFL  for tasks assessed/performed Overall Cognitive Status: Within Functional Limits for tasks assessed                                        General Comments      Exercises     Assessment/Plan    PT Assessment Patient needs continued PT services  PT Problem List Decreased strength;Decreased mobility;Decreased activity tolerance;Decreased balance;Decreased knowledge of use of DME       PT Treatment Interventions DME instruction;Gait training;Functional mobility training;Balance training;Therapeutic exercise;Therapeutic activities;Neuromuscular re-education    PT Goals (Current goals can be found in the Care Plan section)  Acute Rehab PT Goals Patient Stated Goal: go home and get back to work PT Goal Formulation: With patient Time For Goal Achievement: 11/22/18 Potential to Achieve Goals: Good    Frequency Min 3X/week   Barriers to discharge        Co-evaluation               AM-PAC PT "6 Clicks" Mobility  Outcome Measure Help needed turning from your back to your side while in a flat bed without using bedrails?: None Help needed moving from lying on your back to sitting on the side of a flat bed without using bedrails?: None Help needed moving to and from a bed to a chair (including a wheelchair)?: A Little Help needed standing up from a chair using your arms (e.g., wheelchair or bedside chair)?: A Little Help needed to walk in hospital room?: A Little Help needed climbing 3-5 steps with a railing? : A Little 6 Click Score: 20    End of Session Equipment Utilized During Treatment: Gait belt Activity Tolerance: Patient tolerated treatment well Patient left: Other (comment)(at sink for bath with call bell within reach) Nurse Communication: Mobility status PT Visit Diagnosis: Muscle weakness (generalized) (M62.81);Unsteadiness on feet (R26.81);Difficulty in walking, not elsewhere classified (R26.2)    Time: 0955-1010 PT Time Calculation (min)  (ACUTE ONLY): 15 min   Charges:   PT Evaluation $PT Eval Low Complexity: 1 Low          Kristen Roman, [place] Pager [phone] 11/08/2018   Kristen Roman 11/08/2018, 10:51 AM

## 2018-11-08 NOTE — Evaluation (Signed)
Occupational Therapy Evaluation Patient Details Name: Kristen Roman MRN: [id number] DOB: [birth year] Today's Date: 11/08/2018    History of Present Illness Pt is a 58 y/o female presents with c/o dizziness and falls. Found with AKI, orthostatic hypotension, L2 compression fx. 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    Clinical Impression   PTA patient independent and working. Admitted for above and limited by problem list below, including impaired balance, pain, decreased activity tolerance and generalized weakness.  Patient able to complete transfers and in room mobility with min guard (increased stability and safety using RW fading to close supervision), LB ADLs with min guard and grooming at sink with min guard.  VSS during session, see flowsheets for orthostatic vitals (negative this morning).  Discussed safety, fall prevention and recommendations. Patient will benefit from continued OT services while admitted to maximize independence and safety with ADLS/mobility, but anticipate she will progress quickly with no further services needed at dc.      Follow Up Recommendations  No OT follow up;Supervision - Intermittent    Equipment Recommendations  3 in 1 bedside commode;Other (comment)(rolling walker )    Recommendations for Other Services PT consult     Precautions / Restrictions Precautions Precautions: Fall Precaution Comments: reviewed spinal precautions for comfort  Restrictions Weight Bearing Restrictions: No      Mobility Bed Mobility Overal bed mobility: Needs Assistance Bed Mobility: Supine to Sit;Sit to Supine     Supine to sit: Supervision Sit to supine: Supervision   General bed mobility comments: no assist required, supervision for safety   Transfers Overall transfer level: Needs assistance   Transfers: Sit to/from Stand Sit to Stand: Min guard;Supervision         General transfer  comment: min guard to close supervision for safety and balance, increased comfort with RW     Balance Overall balance assessment: Needs assistance;History of Falls Sitting-balance support: No upper extremity supported;Feet supported Sitting balance-Leahy Scale: Good     Standing balance support: Bilateral upper extremity supported;No upper extremity supported;During functional activity Standing balance-Leahy Scale: Poor Standing balance comment: preference to UE support                            ADL either performed or assessed with clinical judgement   ADL Overall ADL's : Needs assistance/impaired     Grooming: Min guard;Standing   Upper Body Bathing: Set up;Sitting   Lower Body Bathing: Min guard;Sit to/from stand Lower Body Bathing Details (indicate cue type and reason): reviewed figure 4 technique seated for safety and pain mgmt  Upper Body Dressing : Set up;Sitting   Lower Body Dressing: Min guard;Sit to/from stand Lower Body Dressing Details (indicate cue type and reason): reviewed figure 4 technique and dressing sitting for safety and pain mgmt Toilet Transfer: Min guard;Ambulation Toilet Transfer Details (indicate cue type and reason): no AD, then RW for mobility in room         Functional mobility during ADLs: Min guard;Rolling walker General ADL Comments: pt initally completed mobility without AD, provided RW for increased safety; pt reports increased comfort with RW      Vision Baseline Vision/History: Wears glasses Wears Glasses: At all times Patient Visual Report: Blurring of vision(d/t bells palsy ) Additional Comments: appears WFL based on functional tasks      Perception     Praxis      Pertinent Vitals/Pain Pain Assessment:  Faces Faces Pain Scale: Hurts little more Pain Location: R sided soreness, lower back Pain Descriptors / Indicators: Discomfort;Sore Pain Intervention(s): Monitored during session;Repositioned;Patient requesting  pain meds-RN notified     Hand Dominance Right   Extremity/Trunk Assessment Upper Extremity Assessment Upper Extremity Assessment: Generalized weakness   Lower Extremity Assessment Lower Extremity Assessment: Defer to PT evaluation       Communication Communication Communication: No difficulties   Cognition Arousal/Alertness: Awake/alert Behavior During Therapy: WFL for tasks assessed/performed Overall Cognitive Status: Within Functional Limits for tasks assessed                                     General Comments       Exercises     Shoulder Instructions      Home Living Family/patient expects to be discharged to:: Private residence Living Arrangements: Alone Available Help at Discharge: Friend(s);Available PRN/intermittently Type of Home: Apartment Home Access: Level entry     Home Layout: One level     Bathroom Shower/Tub: Teacher, early years/pre: Standard     Home Equipment: Cane - single point          Prior Functioning/Environment Level of Independence: Independent        Comments: independent, working, driving         OT Problem List: Decreased strength;Decreased activity tolerance;Impaired balance (sitting and/or standing);Decreased knowledge of use of DME or AE;Decreased knowledge of precautions;Other (comment);Pain(decreased activity tolerance)      OT Treatment/Interventions: Self-care/ADL training;DME and/or AE instruction;Therapeutic activities;Patient/family education;Balance training;Energy conservation    OT Goals(Current goals can be found in the care plan section) Acute Rehab OT Goals Patient Stated Goal: to feel better and get home  OT Goal Formulation: With patient Time For Goal Achievement: 11/22/18 Potential to Achieve Goals: Good  OT Frequency: Min 2X/week   Barriers to D/C:            Co-evaluation              AM-PAC OT "6 Clicks" Daily Activity     Outcome Measure Help from  another person eating meals?: None Help from another person taking care of personal grooming?: A Little Help from another person toileting, which includes using toliet, bedpan, or urinal?: A Little Help from another person bathing (including washing, rinsing, drying)?: A Little Help from another person to put on and taking off regular upper body clothing?: None Help from another person to put on and taking off regular lower body clothing?: A Little 6 Click Score: 20   End of Session Equipment Utilized During Treatment: Rolling walker Nurse Communication: Mobility status;Patient requests pain meds  Activity Tolerance: Patient tolerated treatment well Patient left: in bed;with call bell/phone within reach;with bed alarm set  OT Visit Diagnosis: Unsteadiness on feet (R26.81);Pain;History of falling (Z91.81) Pain - Right/Left: Right Pain - part of body: (lower back, R side )                Time: 4081-4481 OT Time Calculation (min): 16 min Charges:  OT General Charges $OT Visit: 1 Visit OT Evaluation $OT Eval Moderate Complexity: [place], OT Acute Rehabilitation Services Pager [phone] Office [phone]   Delight Stare 11/08/2018, 8:30 AM

## 2018-11-08 NOTE — Progress Notes (Signed)
CCMD notified of run of bigeminy.  RN notified triad.  Patient asymptomatic.

## 2018-11-08 NOTE — Progress Notes (Addendum)
PT BP low. Pt asymptomatic. MD notified. Awaiting orders. Will continue to monitor pt.
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
## 2018-11-08 NOTE — Plan of Care (Signed)

## 2018-11-08 NOTE — Progress Notes (Signed)
Pharmacy note - HIV medications  Patient with orders for Truvada daily.  I investigated this choice because usually HIV patients are on at least 3 anti-retrovirals at a time.  She receives her care through [facility], and I looked at [place] to obtain the following information:  1. Dr. Irven [place] changed her HAART therapy to [facility] in August, 2019 in an effort to simplify her regimen. 2. This medication choice was verified by Dr. Vanessa Boyd during her June 2020 admission.  Discussed with Dr. Benny Lennert on secure chat - Will change her HAART therapy to [facility] starting tomorrow.  I suspect she will need a Rx for this since she has not filled it since May per [facility] records.  Heide Guile, PharmD, BCPS-AQ ID Clinical Pharmacist Pager [phone]

## 2018-11-08 NOTE — Progress Notes (Signed)
Pt refusing bed alarm.

## 2018-11-08 NOTE — Plan of Care (Signed)
  Problem: Education: Goal: Knowledge of General Education information will improve Description: Including pain rating scale, medication(s)/side effects and non-pharmacologic comfort measures Outcome: Progressing   Problem: Clinical Measurements: Goal: Respiratory complications will improve Outcome: Progressing Goal: Cardiovascular complication will be avoided Outcome: Progressing   Problem: Activity: Goal: Risk for activity intolerance will decrease Outcome: Progressing   Problem: Nutrition: Goal: Adequate nutrition will be maintained Outcome: Progressing   Problem: Coping: Goal: Level of anxiety will decrease Outcome: Progressing   Problem: Elimination: Goal: Will not experience complications related to bowel motility Outcome: Progressing Goal: Will not experience complications related to urinary retention Outcome: Progressing   Problem: Pain Managment: Goal: General experience of comfort will improve Outcome: Progressing   Problem: Safety: Goal: Ability to remain free from injury will improve Outcome: Progressing

## 2018-11-08 NOTE — Progress Notes (Signed)
PROGRESS NOTE  NHYLA NAPPI LFY:[id number] DOB: 1961/02/01 DOA: 11/06/2018 PCP: [place]  Brief History    LADY Kristen Roman is a 58 y.o. 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  She reports feeling dizzy all weekend and reports falling on 3 separate occasions at home.  Denies any loss of consciousness or trauma to her head.  She landed on her right side each time and complained to the whole right side feeling sore.  Complains of having no appetite with decreased p.o. intake over the last 6 days.  Denies having any significant fever, chills nausea, vomiting, or diarrhea symptoms.  Associated symptoms include constipation with last bowel movement last week and muscle spasms in both legs.  Patient was last hospitalized at [facility] in June for right-sided facial swelling and weakness.  Found to have a parotid mass with extension into the skull base and multiple lytic lesions without signs of a stroke.  Tissue samples revealed adenocarcinoma.  Since that time she reports that she has had weakness on the right side of her face with pins and needle sensation.  Since that hospitalization she reports that she has had a difficult time ambulating.  At baseline patient lives alone and did not require assistive devices.  She has a follow-up appointment with hematology at [facility] on August 11.  In the ED the patient was found to be hypotensive with a sodium of 123, BUN 42 and creatinine of 1.56. Imagery revealed advanced arthritis and mild L2 compression fracture of undetermined age.   Consultants  . None  Procedures  . None  Antibiotics   Anti-infectives (From admission, onward)   Start     Dose/Rate Route Frequency Ordered Stop   11/07/18 1400  emtricitabine-tenofovir AF (DESCOVY) 200-25  MG per tablet 1 tablet     1 tablet Oral Daily 11/07/18 1218      .   Interval History/Subjective  The patient is complaining of right hip pain. She is able to bear weight on that side as well as extend and flex the hip.   Objective   Vitals:  Vitals:   11/08/18 0357 11/08/18 1128  BP: (!) 125/91 (!) 111/92  Pulse: 88 78  Resp: 18 20  Temp: 99.3 F (37.4 C) 97.8 F (36.6 C)  SpO2: 100% 99%    Exam:  Constitutional:  . The patient is awake, alert, and oriented x 3. No acute distress. Respiratory:  . The patient has no increased work of breathing. . No wheezes, rales, or rhonchi. . No tactile fremitus. Cardiovascular:  . Regular rate and rhythm. . No murmurs, ectopy, or gallups. . No lateral PMI. No thrills. Abdomen:  . Abdomen is soft, non-tender, non-distended. . No hernias,masses, or organomegaly is appreciated. . Normoactive bowel sounds. Musculoskeletal:  . No cyanosis, clubbing, or edema Skin:  . No rashes, lesions, ulcers . palpation of skin: no induration or nodules Neurologic:  . CN 2-12 intact . Sensation all 4 extremities intact . Moving all extremities. Psychiatric:  . Mental status o Mood, affect appropriate o Orientation to person, place, time  . judgment and insight appear intact    I have personally reviewed the following:   Today's Data  . Vitals, BMP, CBC  Micro Data  . COVID-19 negative  Imaging  . DG Lumbar spine: Mild compression fracture at  L2 of undetermined age . X-ray of chest: Degenerative changes in the right shoulder. . DG of the Thoracic Spine: No osseus abnormalities. . DG right shoulder: Advanced arthritis of the right shoulder.  Scheduled Meds: . acetaminophen  650 mg Oral Once  . citalopram  10 mg Oral Daily  . emtricitabine-tenofovir AF  1 tablet Oral Daily  . enoxaparin (LOVENOX) injection  40 mg Subcutaneous Q24H  . feeding supplement (ENSURE ENLIVE)  237 mL Oral BID BM  . gabapentin  300 mg Oral TID  .  magnesium hydroxide  5 mL Oral Daily  . metoprolol tartrate  12.5 mg Oral BID  . sodium chloride flush  3 mL Intravenous Q12H   Continuous Infusions: . sodium chloride 100 mL/hr at 11/08/18 1610    Principal Problem:   AKI (acute kidney injury) ([place]) Active Problems:   Pain in joint, multiple sites   Depression   Hyponatremia   Primary adenocarcinoma of parotid gland ([place])   Transient hypotension   Recurrent falls   LOS: 1 day   A & P  Acute kidney injury: Resolved.  Patient presents with creatinine elevated up to 1.56 with BUN 42.  Creatinine now down to 0.46. BUN is 11.  Patient reports poor p.o. intake prior to admission.  CT elevated BUN to creatinine ratio is also consistent with a prerenal cause of symptoms. Continue to observe volume status with strict I's and O's., creatinine, and electrolytes. Will avoid nephrotoxic medications and hypotension.   Hyponatremia: Acute. Sodium noted to be as low as 123 upon admission on 11/06/2018. Likely this is due to hypovolemic hyponatremia. Correcting with IV fluids cautiously. Na 131 the morning of 11/08/2018. Serial monitoring of BMP. Will reduce rate of IV fluids.  Transient hypotension: Resolved.  Blood pressures noted to be as low as 85/52 and were responsive to IV fluids. Rate has been reduced to 50 cc/hr. Monitor BP as pressures have been quite labile. Low dose metoprolol has been started due to report of NSVT this morning.   Hypomagnesemia: Supplement and monitor.  Recurrent falls: Patient reported having current falls at home.  Orthostatic vital signs were positive on admission. X-ray right hip. PT/OT to eval and treat.  HIV: Currently on Truvada and is followed in outpatient setting by infectious disease at [facility].  In June of 2020 HIV RNA quantitation 7340, % uppercase lymphocytes 23.1, T-helper lymphocytes 0.46. Continue pharmacy substitution current antiretroviral medication regimen. Continue outpatient  follow-up with infectious disease at [facility].  Right hip pain: Patient appears to be able to stand on the right lower extremity as well as flex and extend the joint. Will check x-ray of the hip to rule out fracture.  Orthostatic hypotension: Positive orthostatics yesterday. Continue IV fluids and avoid hydrochlorothiazide.  Adenocarcinoma of the parotid gland: Diagnosed during hospitalization in June 2020 at [facility]. Keep follow-up appointment scheduled for August 11  Depression: Continue citalopram.  Arthritis of the right shoulder and L2 compression fracture: Findings found on x-ray imaging. Continue gabapentin, oxycodone as needed for pain  I have seen and examined this patient myself. I have spent 34 minutes in her evaluation and care.  DVT prophylaxis: Lovenox Code Status: Full Family Communication: No family present at bedside Disposition Plan: Likely discharge home in 2 to 3 days  Thaddeus Evitts, DO [facility] Direct contact: see [email or web address]  7PM-7AM contact night coverage as above 11/08/2018, 1:50 PM  LOS: 1 day

## 2018-11-09 LAB — BASIC METABOLIC PANEL
Anion gap: 9 (ref 5–15)
BUN: 6 mg/dL (ref 6–20)
CO2: 26 mmol/L (ref 22–32)
Calcium: 9 mg/dL (ref 8.9–10.3)
Chloride: 96 mmol/L — ABNORMAL LOW (ref 98–111)
Creatinine, Ser: 0.43 mg/dL — ABNORMAL LOW (ref 0.44–1.00)
GFR calc Af Amer: >60 mL/min (ref >60)
GFR calc non Af Amer: >60 mL/min (ref >60)
Glucose, Bld: 91 mg/dL (ref 70–99)
Potassium: 4 mmol/L (ref 3.5–5.1)
Sodium: 131 mmol/L — ABNORMAL LOW (ref 135–145)

## 2018-11-09 MED ORDER — ENSURE ENLIVE PO LIQD: 237.0000 mL | Freq: Two times a day (BID) | ORAL | 12 refills | Status: DC

## 2018-11-09 MED ORDER — METOPROLOL TARTRATE 25 MG PO TABS: 12.5000 mg | ORAL_TABLET | Freq: Two times a day (BID) | ORAL | 0 refills | Status: DC

## 2018-11-09 NOTE — Discharge Summary (Signed)
Physician Discharge Summary  Kristen Roman KVQ:[id number] DOB: May 17, 1960 DOA: 11/06/2018  PCP: [place] date: 11/06/2018 Discharge date: 11/09/2018  Recommendations for Outpatient Follow-up:  1. Follow up with PCP in 7-10 days.  2. Follow up with oncology as directred.  [place] On 11/21/2018.   Specialty: Internal Medicine Why: For Hospital Follow-up @ 12:45 pm Contact information: [address] [place] [address] [place] [address] [phone]            Discharge Diagnoses: Principal diagnosis is #1 1. Acute renal insufficiency 2. Hyponatremia 3. Hypotension 4. Recurrent falls 5.   Discharge Condition: Fair Disposition: Home  Diet recommendation: Low sodium heart healthy  Filed Weights   11/08/18 0017 11/08/18 0357 11/09/18 0500  Weight: 67.5 kg 67.1 kg 69.3 kg    History of present illness:  Kristen Roman is a 58 y.o. 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  She reports feeling dizzy all weekend and reports falling on 3 separate occasions at home.  Denies any loss of consciousness or trauma to her head.  She landed on her right side each time and complained to the whole right side feeling sore.  Complains of having no appetite with decreased p.o. intake over the last 6 days.  Denies having any significant fever, chills nausea, vomiting, or diarrhea symptoms.  Associated symptoms include constipation with last bowel movement last week and muscle spasms in both legs.  Patient was last hospitalized at [facility] in June for right-sided facial swelling and weakness.  Found to have a parotid mass with extension into the skull base and multiple lytic lesions without signs of a stroke.  Tissue samples revealed  adenocarcinoma.  Since that time she reports that she has had weakness on the right side of her face with pins and needle sensation.  Since that hospitalization she reports that she has had a difficult time ambulating.  At baseline patient lives alone and did not require assistive devices.  She has a follow-up appointment with hematology at [facility] on August 11.   ED Course: Upon admission into the emergency department patient was noted to be afebrile, pulse 86-102, respirations 16-23, blood pressures 85/52-130 1/98, and O2 saturations maintained on room air.  Labs significant for sodium 123, BUN 42, and creatinine 1.56.  COVID-19 screening negative.  Urinalysis no acute abnormalities.  Imaging studies including CT of the cervical spine and head were negative for any acute abnormalities.  X-rays of the chest, lumbar, thoracic spine, and right shoulder revealed advanced arthritis and a mild L2 compression fracture of undetermined age.  Patient had been given 1 L of normal saline IV fluids, Tylenol, and 50 mcg of fentanyl prior to being transported to a telemetry bed here at [facility].  Hospital Course:  The patient was admitted to a telemetry bed. She was given IV fluids. Nephrotoxic agents. Her sodium was corrected with IV fluids over the course of 2 days as was her hypotension. She has been evaluated by PT/OT due to her frequent falls at home. They have stated that she needs no further therapy. Her hypotension, hyponatremia, and elevated creatinine have resolved. Her antihypertensives were adjusted. She is doing well and will be discharged to home in good condition.  Today's assessment: S: The patient is resting  quietly. No new complaints O: Vitals:  Vitals:   11/09/18 0004 11/09/18 0527  BP: 108/69 115/84  Pulse:  72  Resp:  18  Temp:  98.8 F (37.1 C)  SpO2:  98%    Constitutional:   The patient is awake, alert, and oriented x 3. No acute distress. Respiratory:   No  increased work of breathing.  No wheezes, rales, or rhonchi.   No tactile fremitus. Cardiovascular:   Regular rate and rhythm  No murmurs, ectopy, and gallups.  No lateral PMI. No thrills. Abdomen:   Abdomen is soft, non-tender, non-distended.  No hernias, masses, or organomegaly  Normoactive bowel sounds.  Musculoskeletal:   No cyanosis, clubbing, or edema Skin:   No rashes, lesions, ulcers  palpation of skin: no induration or nodules Neurologic:   CN 2-12 intact  Sensation all 4 extremities intact Psychiatric:   judgement and insight appear normal  Mental status o Mood, affect appropriate o Orientation to person, place, time   Discharge Instructions  Discharge Instructions    Activity as tolerated - No restrictions   Complete by: As directed    Call MD for:  difficulty breathing, headache or visual disturbances   Complete by: As directed    Diet - low sodium heart healthy   Complete by: As directed    Discharge instructions   Complete by: As directed    Follow up with PCP in 7-10 days. Follow up with oncology.   Increase activity slowly   Complete by: As directed      Allergies as of 11/09/2018      Reactions   Amoxicillin Itching   Penicillin G Itching   Did it involve swelling of the face/tongue/throat, SOB, or low BP? No Did it involve sudden or severe rash/hives, skin peeling, or any reaction on the inside of your mouth or nose? No Did you need to seek medical attention at a hospital or doctor's office? yes When did it last happen?recently If all above answers are NO, may proceed with cephalosporin use.      Medication List    STOP taking these medications   hydrOXYzine 25 MG capsule Commonly known as: Vistaril   lisinopril-hydrochlorothiazide 10-12.5 MG tablet Commonly known as: ZESTORETIC   naproxen 375 MG tablet Commonly known as: NAPROSYN     TAKE these medications   acetaminophen 500 MG tablet Commonly known as:  TYLENOL Take 1,000 mg by mouth every 6 (six) hours as needed. Patient used this medication for pain.   Biktarvy 50-200-25 MG Tabs tablet Generic drug: bictegravir-emtricitabine-tenofovir AF Take 1 tablet by mouth daily. Take 1 tablet by mouth daily   citalopram 10 MG tablet Commonly known as: CELEXA TAKE 1 TABLET BY MOUTH EVERY DAY What changed: Another medication with the same name was removed. Continue taking this medication, and follow the directions you see here.   cyclobenzaprine 5 MG tablet Commonly known as: FLEXERIL Take 5 mg by mouth 3 (three) times daily as needed for muscle spasms.   feeding supplement (ENSURE ENLIVE) Liqd Take 237 mLs by mouth 2 (two) times daily between meals.   gabapentin 300 MG capsule Commonly known as: Neurontin Take 1 capsule (300 mg total) by mouth 3 (three) times daily.   GOODY HEADACHE PO Take 1 packet by mouth daily as needed. Patient states that she uses this medication for every pain that she has.   metoprolol tartrate 25 MG tablet Commonly known as: LOPRESSOR Take 0.5 tablets (12.5 mg total) by mouth 2 (  two) times daily.   oxyCODONE-acetaminophen 5-325 MG tablet Commonly known as: PERCOCET/ROXICET Take 1-2 tablets by mouth every 6 (six) hours as needed for severe pain.            Durable Medical Equipment  (From admission, onward)         Start     Ordered   11/09/18 1338  DME 3-in-1  Once     11/09/18 1338   11/09/18 1338  For home use only DME 4 wheeled rolling walker with seat  (Walkers)  Once    Question:  Patient needs a walker to treat with the following condition  Answer:  Ambulatory dysfunction   11/09/18 1338   11/08/18 1212  For home use only DME 3 n 1  Once     11/08/18 1211   11/08/18 1209  For home use only DME 4 wheeled rolling walker with seat  Once    Comments: rollator  Question:  Patient needs a walker to treat with the following condition  Answer:  Acute kidney injury ([place])   11/08/18 1209          Allergies  Allergen Reactions   Amoxicillin Itching   Penicillin G Itching    Did it involve swelling of the face/tongue/throat, SOB, or low BP? No Did it involve sudden or severe rash/hives, skin peeling, or any reaction on the inside of your mouth or nose? No Did you need to seek medical attention at a hospital or doctor's office? yes When did it last happen?recently If all above answers are NO, may proceed with cephalosporin use.    The results of significant diagnostics from this hospitalization (including imaging, microbiology, ancillary and laboratory) are listed below for reference.    Significant Diagnostic Studies: Dg Thoracic Spine 2 View  Result Date: 11/06/2018 CLINICAL DATA:  Multiple falls EXAM: THORACIC SPINE 2 VIEWS COMPARISON:  Chest x-ray 09/08/2009 FINDINGS: Thoracic alignment within normal limits. Imaged vertebral bodies demonstrate normal stature. Minimal degenerative osteophytes. IMPRESSION: No acute osseous abnormality Electronically Signed   By: Donavan Foil M.D.   On: 11/06/2018 22:53   Dg Lumbar Spine Complete  Result Date: 11/06/2018 CLINICAL DATA:  Numerous falls EXAM: LUMBAR SPINE - COMPLETE 4+ VIEW COMPARISON:  12/04/2015 FINDINGS: Lumbar alignment within normal limits. Mild compression deformity at L2, uncertain age. Mild degenerative changes L1-L2, L2-L3, L4-L5 IMPRESSION: Mild compression deformity L2 uncertain age. Scattered degenerative changes within the lumbar spine. Electronically Signed   By: Donavan Foil M.D.   On: 11/06/2018 22:51   Dg Shoulder Right  Result Date: 11/06/2018 CLINICAL DATA:  Shoulder pain, fall EXAM: RIGHT SHOULDER - 2+ VIEW COMPARISON:  None. FINDINGS: No fracture or malalignment. Advanced degenerative change of the glenohumeral interval with joint space narrowing and bulky inferior osteophyte. Right lung apex is clear IMPRESSION: No acute osseous abnormality. Advanced arthritis of the right shoulder Electronically Signed    By: Donavan Foil M.D.   On: 11/06/2018 22:52   Ct Head Wo Contrast  Result Date: 11/06/2018 CLINICAL DATA:  Numerous falls, headache EXAM: CT HEAD WITHOUT CONTRAST CT CERVICAL SPINE WITHOUT CONTRAST TECHNIQUE: Multidetector CT imaging of the head and cervical spine was performed following the standard protocol without intravenous contrast. Multiplanar CT image reconstructions of the cervical spine were also generated. COMPARISON:  September 20, 2018 FINDINGS: CT HEAD FINDINGS Brain: No acute intracranial abnormality. Specifically, no hemorrhage, hydrocephalus, mass lesion, acute infarction, or significant intracranial injury. Vascular: No hyperdense vessel or unexpected calcification. Skull: No acute calvarial  abnormality. Sinuses/Orbits: Visualized paranasal sinuses and mastoids clear. Orbital soft tissues unremarkable. Other: None CT CERVICAL SPINE FINDINGS Alignment: Normal Skull base and vertebrae: No acute fracture. No primary bone lesion or focal pathologic process. Soft tissues and spinal canal: No prevertebral fluid or swelling. No visible canal hematoma. Disc levels:  Disc space narrowing and spurring at C5-6 and C6-7. Upper chest: Negative Other: None IMPRESSION: No acute intracranial abnormality. No acute bony abnormality in the cervical spine. Electronically Signed   By: Rolm Baptise M.D.   On: 11/06/2018 23:21   Ct Cervical Spine Wo Contrast  Result Date: 11/06/2018 CLINICAL DATA:  Numerous falls, headache EXAM: CT HEAD WITHOUT CONTRAST CT CERVICAL SPINE WITHOUT CONTRAST TECHNIQUE: Multidetector CT imaging of the head and cervical spine was performed following the standard protocol without intravenous contrast. Multiplanar CT image reconstructions of the cervical spine were also generated. COMPARISON:  September 20, 2018 FINDINGS: CT HEAD FINDINGS Brain: No acute intracranial abnormality. Specifically, no hemorrhage, hydrocephalus, mass lesion, acute infarction, or significant intracranial injury.  Vascular: No hyperdense vessel or unexpected calcification. Skull: No acute calvarial abnormality. Sinuses/Orbits: Visualized paranasal sinuses and mastoids clear. Orbital soft tissues unremarkable. Other: None CT CERVICAL SPINE FINDINGS Alignment: Normal Skull base and vertebrae: No acute fracture. No primary bone lesion or focal pathologic process. Soft tissues and spinal canal: No prevertebral fluid or swelling. No visible canal hematoma. Disc levels:  Disc space narrowing and spurring at C5-6 and C6-7. Upper chest: Negative Other: None IMPRESSION: No acute intracranial abnormality. No acute bony abnormality in the cervical spine. Electronically Signed   By: Rolm Baptise M.D.   On: 11/06/2018 23:21   Dg Chest Portable 1 View  Result Date: 11/06/2018 CLINICAL DATA:  Cough, multiple falls at home, possible syncopal event, right shoulder hip and back pain. EXAM: PORTABLE CHEST 1 VIEW COMPARISON:  Chest radiograph September 20, 2018 FINDINGS: No consolidation, features of edema, pneumothorax, or effusion. Pulmonary vascularity is normally distributed. Tortuous brachiocephalic vasculature. The cardiomediastinal contours are unremarkable. No acute osseous or soft tissue abnormality. Extensive osteoarthrosis of the right shoulder. Mild degenerative changes in the thoracic spine. Chest it has fracture IMPRESSION: No acute cardiopulmonary abnormality. Degenerative changes in the right shoulder. Electronically Signed   By: Lovena Le M.D.   On: 11/06/2018 22:44   Dg Hip Unilat With Pelvis 2-3 Views Right  Result Date: 11/08/2018 CLINICAL DATA:  Fall, landing on right hip.  Soreness. EXAM: DG HIP (WITH OR WITHOUT PELVIS) 2-3V RIGHT COMPARISON:  11/06/2018. FINDINGS: Diffuse osteopenia. Deformity noted the right inferior pubic ramus consistent with fracture, age undetermined. No interim change from 11/06/2018. Diffuse osteopenia. Faint lucencies noted about the bony pelvis, most prominent about the right ischium. This may  be secondary to osteopenia. Infiltrative process including myeloma or metastatic disease cannot be completely excluded as previously mentioned on prior study of 11/06/2018. No other focal bony abnormality is identified. Right hip is intact. IMPRESSION: 1. Deformity of the right inferior pubic ramus again noted consistent with fracture, age undetermined. No change noted from 11/06/2018. Right hip is intact. No evidence of right hip fracture or dislocation. 2. Diffuse osteopenia. Faint lucencies noted about the bony pelvis most prominent about the right ischium. These may be secondary to the osteopenia present. Infiltrate process including myeloma or metastatic disease cannot be completely excluded as previously mentioned on prior study of 11/06/2018. Electronically Signed   By: [place]   On: 11/08/2018 16:22   Dg Hip Unilat With Pelvis 2-3 Views Right  Result Date: 11/06/2018 CLINICAL DATA:  Multiple falls EXAM: DG HIP (WITH OR WITHOUT PELVIS) 2-3V RIGHT COMPARISON:  None. FINDINGS: SI joints are non widened. Pubic symphysis is intact. Age indeterminate fracture deformity right inferior pubic ramus. Right femoral head projects in joint. Heterogenous mineralization of the right ischium and inferior pubic ramus. IMPRESSION: 1. Minimal deformity of the right inferior pubic ramus suggest age indeterminate fracture 2. Heterogenous mineralization of the right ischium and inferior pubic ramus, raises possibility of marrow infiltrative process. Electronically Signed   By: Donavan Foil M.D.   On: 11/06/2018 22:55    Microbiology: Recent Results (from the past 240 hour(s))  SARS Coronavirus 2 [facility] order, Performed in [facility] hospital lab) Nasopharyngeal Nasopharyngeal Swab     Status: None   Collection Time: 11/07/18 12:01 AM   Specimen: Nasopharyngeal Swab  Result Value Ref Range Status   SARS Coronavirus 2 NEGATIVE NEGATIVE Final    Comment: (NOTE) If result is NEGATIVE SARS-CoV-2 target  nucleic acids are NOT DETECTED. The SARS-CoV-2 RNA is generally detectable in upper and lower  respiratory specimens during the acute phase of infection. The lowest  concentration of SARS-CoV-2 viral copies this assay can detect is 250  copies / mL. A negative result does not preclude SARS-CoV-2 infection  and should not be used as the sole basis for treatment or other  patient management decisions.  A negative result may occur with  improper specimen collection / handling, submission of specimen other  than nasopharyngeal swab, presence of viral mutation(s) within the  areas targeted by this assay, and inadequate number of viral copies  (<250 copies / mL). A negative result must be combined with clinical  observations, patient history, and epidemiological information. If result is POSITIVE SARS-CoV-2 target nucleic acids are DETECTED. The SARS-CoV-2 RNA is generally detectable in upper and lower  respiratory specimens dur ing the acute phase of infection.  Positive  results are indicative of active infection with SARS-CoV-2.  Clinical  correlation with patient history and other diagnostic information is  necessary to determine patient infection status.  Positive results do  not rule out bacterial infection or co-infection with other viruses. If result is PRESUMPTIVE POSTIVE SARS-CoV-2 nucleic acids MAY BE PRESENT.   A presumptive positive result was obtained on the submitted specimen  and confirmed on repeat testing.  While 2019 novel coronavirus  (SARS-CoV-2) nucleic acids may be present in the submitted sample  additional confirmatory testing may be necessary for epidemiological  and / or clinical management purposes  to differentiate between  SARS-CoV-2 and other Sarbecovirus currently known to infect humans.  If clinically indicated additional testing with an alternate test  methodology [id number]) is advised. The SARS-CoV-2 RNA is generally  detectable in upper and lower  respiratory sp ecimens during the acute  phase of infection. The expected result is Negative. Fact Sheet for Patients:  StrictlyIdeas.no Fact Sheet for Healthcare Providers: BankingDealers.co.za This test is not yet approved or cleared by the [place] FDA and has been authorized for detection and/or diagnosis of SARS-CoV-2 by FDA under an Emergency Use Authorization (EUA).  This EUA will remain in effect (meaning this test can be used) for the duration of the COVID-19 declaration under Section 564(b)(1) of the Act, 21 U.S.C. section 360bbb-3(b)(1), unless the authorization is terminated or revoked sooner. Performed at [facility], [address], [place], [place] [address]      Labs: Basic Metabolic Panel: Recent Labs  Lab 11/06/18 2005 11/07/18 1031  11/07/18 1751 11/08/18 0603 11/09/18 0431  NA 123* 126* 129* 131* 131*  K 4.8 4.1 4.3 4.2 4.0  CL 86* 94* 92* 97* 96*  CO2 22 19* 28 26 26   GLUCOSE 89 80 76 87 91  BUN 42* 28* 20 11 6   CREATININE 1.56* 0.69 0.68 0.46 0.43*  CALCIUM 9.9 9.4 9.4 9.2 9.0  MG  --   --   --  1.6*  --    Liver Function Tests: Recent Labs  Lab 11/06/18 2005  AST 38  ALT 14  ALKPHOS 111  BILITOT 0.9  PROT 7.4  ALBUMIN 3.4*   No results for input(s): LIPASE, AMYLASE in the last 168 hours. No results for input(s): AMMONIA in the last 168 hours. CBC: Recent Labs  Lab 11/06/18 2005 11/07/18 1031 11/08/18 0603  WBC 6.8 5.8 3.3*  NEUTROABS 3.3  --   --   HGB 12.9 11.3* 10.7*  HCT 38.9 32.5* 32.5*  MCV 70.6* 69.4* 71.6*  PLT 199 176 164   Cardiac Enzymes: Recent Labs  Lab 11/07/18 1400  CKTOTAL 55   BNP: BNP (last 3 results) No results for input(s): BNP in the last 8760 hours.  ProBNP (last 3 results) No results for input(s): PROBNP in the last 8760 hours.  CBG: No results for input(s): GLUCAP in the last 168 hours.  Principal Problem:   AKI (acute kidney  injury) ([place]) Active Problems:   Pain in joint, multiple sites   Depression   Hyponatremia   Primary adenocarcinoma of parotid gland ([place])   Transient hypotension   Recurrent falls   Time coordinating discharge: 38 minutes.  Signed:        Effie Wahlert, DO [facility]  11/09/2018, 3:15 PM

## 2018-11-09 NOTE — Progress Notes (Signed)
Occupational Therapy Treatment Patient Details Name: Kristen Roman MRN: [id number] DOB: 1961-03-01 Today's Date: 11/09/2018    History of present illness Pt is a 58 y/o female presents with c/o dizziness and falls. Found with AKI, orthostatic hypotension, L2 compression fx. 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    OT comments  Pt is making limited progress towards OT goals this session. Able to complete bed mobility at supervision level, figure 4 for donning socks EOB, mobility to bathroom for toilet transfer utilizing furniture for balance - required cues for safety with IV pole and declined use of Rollator even when educated on pain management aspects. Pt able to perform sink level grooming. Pt complaining of back pain at end of session, RN aware. Bed alarm on as Pt presents with decreased safety awareness.    Follow Up Recommendations  No OT follow up;Supervision - Intermittent    Equipment Recommendations  3 in 1 bedside commode;Other (comment)(Rollator)    Recommendations for Other Services PT consult    Precautions / Restrictions Precautions Precautions: Fall Precaution Comments: reviewed spinal precautions for comfort  Restrictions Weight Bearing Restrictions: No       Mobility Bed Mobility Overal bed mobility: Needs Assistance Bed Mobility: Supine to Sit;Sit to Supine     Supine to sit: Supervision Sit to supine: Supervision   General bed mobility comments: no assist required, supervision for safety   Transfers Overall transfer level: Needs assistance Equipment used: None(utilized furniture and items in the room) Transfers: Sit to/from Stand Sit to Stand: Min guard;Supervision         General transfer comment: min guard to close supervision for safety and balance,    Balance Overall balance assessment: Needs assistance;History of Falls Sitting-balance support: No upper extremity  supported;Feet supported Sitting balance-Leahy Scale: Good     Standing balance support: Bilateral upper extremity supported;No upper extremity supported;During functional activity Standing balance-Leahy Scale: Poor Standing balance comment: leans against sink                            ADL either performed or assessed with clinical judgement   ADL Overall ADL's : Needs assistance/impaired     Grooming: Min guard;Standing;Wash/dry hands;Wash/dry face               Lower Body Dressing: Supervision/safety;Sitting/lateral leans Lower Body Dressing Details (indicate cue type and reason): to don socks sitting EOB Toilet Transfer: Min guard;Ambulation Toilet Transfer Details (indicate cue type and reason): no AD, decreased safety awareness, cues for safety         Functional mobility during ADLs: Min guard(furniture surfing) General ADL Comments: pt initally completed mobility without AD, reminded her that its safer and better for her back with RW "oh yeah, I forget that"     Vision Baseline Vision/History: Wears glasses Wears Glasses: At all times Patient Visual Report: Blurring of vision(d/t bells palsy )     Perception     Praxis      Cognition Arousal/Alertness: Awake/alert Behavior During Therapy: WFL for tasks assessed/performed Overall Cognitive Status: Within Functional Limits for tasks assessed                                          Exercises     Shoulder Instructions       General  Comments      Pertinent Vitals/ Pain       Pain Assessment: 0-10 Pain Score: 8  Pain Location: lower back, inside of L thigh Pain Descriptors / Indicators: Discomfort;Sore;Moaning;Cramping Pain Intervention(s): Monitored during session;Repositioned;Heat applied  Home Living Family/patient expects to be discharged to:: Private residence Living Arrangements: Alone Available Help at Discharge: Friend(s);Available PRN/intermittently Type of  Home: Apartment Home Access: Level entry     Home Layout: One level     Bathroom Shower/Tub: Teacher, early years/pre: Standard     Home Equipment: Cane - single point          Prior Functioning/Environment Level of Independence: Independent        Comments: independent, working, driving    Frequency  Min 2X/week        Progress Toward Goals  OT Goals(current goals can now be found in the care plan section)  Progress towards OT goals: Progressing toward goals  Acute Rehab OT Goals Patient Stated Goal: to feel better and get home  OT Goal Formulation: With patient Time For Goal Achievement: 11/22/18 Potential to Achieve Goals: Good  Plan Discharge plan remains appropriate;Frequency remains appropriate    Co-evaluation                 AM-PAC OT "6 Clicks" Daily Activity     Outcome Measure   Help from another person eating meals?: None Help from another person taking care of personal grooming?: A Little Help from another person toileting, which includes using toliet, bedpan, or urinal?: A Little Help from another person bathing (including washing, rinsing, drying)?: A Little Help from another person to put on and taking off regular upper body clothing?: None Help from another person to put on and taking off regular lower body clothing?: A Little 6 Click Score: 20    End of Session    OT Visit Diagnosis: Unsteadiness on feet (R26.81);Pain;History of falling (Z91.81) Pain - Right/Left: Right Pain - part of body: (lower back)   Activity Tolerance Patient tolerated treatment well   Patient Left in bed;with call bell/phone within reach;with bed alarm set   Nurse Communication Mobility status;Patient requests pain meds        Time: 0923-0939 OT Time Calculation (min): 16 min  Charges: OT General Charges $OT Visit: 1 Visit OT Treatments $Self Care/Home Management : 8-22 mins  Hulda Humphrey OTR/L Acute Rehabilitation  Services Pager: [phone] Office: [place] 11/09/2018, 11:32 AM

## 2018-11-09 NOTE — Progress Notes (Signed)
PT Cancellation Note  Patient Details Name: Kristen Roman MRN: [id number] DOB: Dec 09, 1960   Cancelled Treatment:    Reason Eval/Treat Not Completed: Patient declined, no reason specified, on first attempt, patient reports back is hurting. Second attempt, patient reports she is leaving. Will re-attempt tomorrow if patient is not discharged.    Cornell Gaber 11/09/2018, 11:45 AM

## 2018-12-10 ENCOUNTER — Encounter (HOSPITAL_BASED_OUTPATIENT_CLINIC_OR_DEPARTMENT_OTHER): Payer: Self-pay | Admitting: *Deleted

## 2018-12-10 ENCOUNTER — Observation Stay (HOSPITAL_BASED_OUTPATIENT_CLINIC_OR_DEPARTMENT_OTHER)
Admission: EM | Admit: 2018-12-10 | Discharge: 2018-12-11 | Disposition: A | Discharge status: Home / Self Care | Payer: Medicaid Other | Attending: Internal Medicine | Admitting: Internal Medicine

## 2018-12-10 ENCOUNTER — Emergency Department (HOSPITAL_COMMUNITY): Payer: Medicaid Other

## 2018-12-10 ENCOUNTER — Emergency Department (HOSPITAL_BASED_OUTPATIENT_CLINIC_OR_DEPARTMENT_OTHER): Payer: Medicaid Other

## 2018-12-10 ENCOUNTER — Other Ambulatory Visit: Payer: Self-pay

## 2018-12-10 DIAGNOSIS — F329 Major depressive disorder, single episode, unspecified: Secondary | ICD-10-CM | POA: Insufficient documentation

## 2018-12-10 DIAGNOSIS — S2220XA Unspecified fracture of sternum, initial encounter for closed fracture: Secondary | ICD-10-CM | POA: Diagnosis present

## 2018-12-10 DIAGNOSIS — Z20828 Contact with and (suspected) exposure to other viral communicable diseases: Secondary | ICD-10-CM | POA: Diagnosis not present

## 2018-12-10 DIAGNOSIS — B20 Human immunodeficiency virus [HIV] disease: Secondary | ICD-10-CM | POA: Diagnosis not present

## 2018-12-10 DIAGNOSIS — Z79899 Other long term (current) drug therapy: Secondary | ICD-10-CM | POA: Diagnosis not present

## 2018-12-10 DIAGNOSIS — I10 Essential (primary) hypertension: Secondary | ICD-10-CM | POA: Diagnosis present

## 2018-12-10 DIAGNOSIS — F32A Depression, unspecified: Secondary | ICD-10-CM | POA: Diagnosis present

## 2018-12-10 DIAGNOSIS — E871 Hypo-osmolality and hyponatremia: Secondary | ICD-10-CM | POA: Diagnosis present

## 2018-12-10 DIAGNOSIS — S2239XA Fracture of one rib, unspecified side, initial encounter for closed fracture: Secondary | ICD-10-CM | POA: Diagnosis present

## 2018-12-10 DIAGNOSIS — F419 Anxiety disorder, unspecified: Secondary | ICD-10-CM | POA: Insufficient documentation

## 2018-12-10 DIAGNOSIS — X58XXXA Exposure to other specified factors, initial encounter: Secondary | ICD-10-CM | POA: Diagnosis not present

## 2018-12-10 DIAGNOSIS — R079 Chest pain, unspecified: Secondary | ICD-10-CM | POA: Diagnosis not present

## 2018-12-10 DIAGNOSIS — M19011 Primary osteoarthritis, right shoulder: Secondary | ICD-10-CM | POA: Diagnosis not present

## 2018-12-10 DIAGNOSIS — E861 Hypovolemia: Secondary | ICD-10-CM | POA: Diagnosis not present

## 2018-12-10 DIAGNOSIS — F1010 Alcohol abuse, uncomplicated: Secondary | ICD-10-CM | POA: Diagnosis not present

## 2018-12-10 DIAGNOSIS — C07 Malignant neoplasm of parotid gland: Secondary | ICD-10-CM | POA: Diagnosis present

## 2018-12-10 DIAGNOSIS — F17210 Nicotine dependence, cigarettes, uncomplicated: Secondary | ICD-10-CM | POA: Diagnosis present

## 2018-12-10 DIAGNOSIS — W19XXXA Unspecified fall, initial encounter: Secondary | ICD-10-CM | POA: Diagnosis not present

## 2018-12-10 DIAGNOSIS — S2241XA Multiple fractures of ribs, right side, initial encounter for closed fracture: Secondary | ICD-10-CM | POA: Insufficient documentation

## 2018-12-10 DIAGNOSIS — C7951 Secondary malignant neoplasm of bone: Secondary | ICD-10-CM | POA: Diagnosis not present

## 2018-12-10 LAB — CBC
HCT: 34.3 % — ABNORMAL LOW (ref 36.0–46.0)
Hemoglobin: 11.5 g/dL — ABNORMAL LOW (ref 12.0–15.0)
MCH: 23.5 pg — ABNORMAL LOW (ref 26.0–34.0)
MCHC: 33.5 g/dL (ref 30.0–36.0)
MCV: 70.1 fL — ABNORMAL LOW (ref 80.0–100.0)
Platelets: 189 10*3/uL (ref 150–400)
RBC: 4.89 MIL/uL (ref 3.87–5.11)
RDW: 15.5 % (ref 11.5–15.5)
WBC: 4.4 10*3/uL (ref 4.0–10.5)
nRBC: 0 % (ref 0.0–0.2)

## 2018-12-10 LAB — URINALYSIS, ROUTINE W REFLEX MICROSCOPIC
Bilirubin Urine: NEGATIVE
Glucose, UA: NEGATIVE mg/dL
Hgb urine dipstick: NEGATIVE
Ketones, ur: NEGATIVE mg/dL
Nitrite: NEGATIVE
Protein, ur: NEGATIVE mg/dL
Specific Gravity, Urine: 1.015 (ref 1.005–1.030)
pH: 5 (ref 5.0–8.0)

## 2018-12-10 LAB — URINALYSIS, MICROSCOPIC (REFLEX)

## 2018-12-10 LAB — COMPREHENSIVE METABOLIC PANEL
ALT: 12 U/L (ref 0–44)
AST: 27 U/L (ref 15–41)
Albumin: 3.4 g/dL — ABNORMAL LOW (ref 3.5–5.0)
Alkaline Phosphatase: 114 U/L (ref 38–126)
Anion gap: 14 (ref 5–15)
BUN: 16 mg/dL (ref 6–20)
CO2: 23 mmol/L (ref 22–32)
Calcium: 9.4 mg/dL (ref 8.9–10.3)
Chloride: 87 mmol/L — ABNORMAL LOW (ref 98–111)
Creatinine, Ser: 0.82 mg/dL (ref 0.44–1.00)
GFR calc Af Amer: >60 mL/min (ref >60)
GFR calc non Af Amer: >60 mL/min (ref >60)
Glucose, Bld: 83 mg/dL (ref 70–99)
Potassium: 4.2 mmol/L (ref 3.5–5.1)
Sodium: 124 mmol/L — ABNORMAL LOW (ref 135–145)
Total Bilirubin: 0.4 mg/dL (ref 0.3–1.2)
Total Protein: 6.9 g/dL (ref 6.5–8.1)

## 2018-12-10 LAB — LACTIC ACID, PLASMA: Lactic Acid, Venous: 1.4 mmol/L (ref 0.5–1.9)

## 2018-12-10 LAB — D-DIMER, QUANTITATIVE: D-Dimer, Quant: 2.48 ug/mL-FEU — ABNORMAL HIGH (ref 0.00–0.50)

## 2018-12-10 LAB — TROPONIN I (HIGH SENSITIVITY): Troponin I (High Sensitivity): 6 ng/L (ref <18)

## 2018-12-10 MED ORDER — IOHEXOL 350 MG/ML SOLN
80.0000 mL | Freq: Once | INTRAVENOUS | Status: AC | PRN
  Administered 2018-12-10: 80 mL via INTRAVENOUS

## 2018-12-10 MED ORDER — SODIUM CHLORIDE 0.9 % IV BOLUS
500.0000 mL | Freq: Once | INTRAVENOUS | Status: AC
  Administered 2018-12-10: 500 mL via INTRAVENOUS

## 2018-12-10 MED ORDER — FENTANYL CITRATE (PF) 100 MCG/2ML IJ SOLN
25.0000 ug | Freq: Once | INTRAMUSCULAR | Status: AC
  Administered 2018-12-10: 25 ug via INTRAVENOUS
  Filled 2018-12-10: qty 2

## 2018-12-10 NOTE — ED Notes (Signed)
Spoke with York Cerise at [facility] regarding Transfer to [facility] ED

## 2018-12-10 NOTE — ED Provider Notes (Signed)
Patient transferred from [facility] for CT scan angio chest to rule out pulmonary embolism.  She developed central chest pain today with shortness is now improved.  History of parotid carcinoma as well as HIV and hypertension.  Patient has a history of parotid carcinoma with metastasis and is reportedly getting radiation.   She is resting comfortably.  Her chest is tender to palpation.  There is no acute ST changes on her EKG  CT shows no evidence of pulmonary embolism.  Does show sclerotic bone lesions as well as a sternal fracture and several rib fractures.  Patient reports falling several months ago but not recently.  Her chest pain is reproducible with low suspicion for ACS.  Suspect her chest pain is due to her sternal fracture.  She is gently hydrated and sodium will be rechecked.  Troponin is pending.  Admission for hyponatremia discussed with Dr. Blaine Hamper.    Ezequiel Essex, MD 12/11/18 [phone]

## 2018-12-10 NOTE — ED Triage Notes (Signed)
Pt brought by EMS. Per report pt was anxious, hyperventilating and had chest "tenderness". Pt very anxious upon arrival c/o chest pain "doesn't feel good". Unable to obtain further history due to pt's anxious state

## 2018-12-10 NOTE — ED Provider Notes (Signed)
[facility] Emergency Department Provider Note MRN:  [id number]  Arrival date & time: 12/10/18     Chief Complaint   Chest Pain (anxiety)   History of Present Illness   Kristen Roman is a 58 y.o. year-old female with a history of metastatic parotid gland cancer presenting to the ED with chief complaint of chest pain.  2 days of gradual onset central chest pain, patient unable to give more description.  Just says it hurts.  Associated with mild shortness of breath.  Denies headache or vision change, no abdominal pain.  Denies fever or cough.  Endorsing general malaise, weakness.  No other exacerbating relieving factors.  Review of Systems  A complete 10 system review of systems was obtained and all systems are negative except as noted in the HPI and PMH.   Patient's Health History    Past Medical History:  Diagnosis Date  . Cancer ([place])    "bone cancer"  . Hemorrhoid   . HIV disease ([place])   . Hypertension     Past Surgical History:  Procedure Laterality Date  . ABDOMINAL HYSTERECTOMY    . CESAREAN SECTION      Family History  Problem Relation Age of Onset  . Kidney disease Mother     Social History   Socioeconomic History  . Marital status: Single    Spouse name: Not on file  . Number of children: Not on file  . Years of education: Not on file  . Highest education level: Not on file  Occupational History  . Not on file  Social Needs  . Financial resource strain: Not on file  . Food insecurity    Worry: Not on file    Inability: Not on file  . Transportation needs    Medical: Not on file    Non-medical: Not on file  Tobacco Use  . Smoking status: Current Every Day Smoker    Packs/day: 1.00    Years: 40.00    Pack years: 40.00    Types: Cigarettes    Start date: 04/06/1975  . Smokeless tobacco: Never Used  . Tobacco comment: pt. not ready to quit  Substance and Sexual Activity  . Alcohol use: Yes    Alcohol/week: 100.0  standard drinks    Types: 100 Standard drinks or equivalent per week    Comment: pt. states she is an alcoholic - 6-pack a day if she has the money  . Drug use: Yes    Frequency: 3.0 times per week    Types: Marijuana  . Sexual activity: Not Currently    Partners: Male    Comment: pt. given condoms  Lifestyle  . Physical activity    Days per week: Not on file    Minutes per session: Not on file  . Stress: Not on file  Relationships  . Social Herbalist on phone: Not on file    Gets together: Not on file    Attends religious service: Not on file    Active member of club or organization: Not on file    Attends meetings of clubs or organizations: Not on file    Relationship status: Not on file  . Intimate partner violence    Fear of current or ex partner: Not on file    Emotionally abused: Not on file    Physically abused: Not on file    Forced sexual activity: Not on file  Other Topics Concern  . Not  on file  Social History Narrative  . Not on file     Physical Exam  Vital Signs and Nursing Notes reviewed Vitals:   12/10/18 2000 12/10/18 2052  BP: 95/81 108/76  Pulse: 94 90  Resp:  18  Temp:    SpO2: 100% 100%    CONSTITUTIONAL: Chronically ill-appearing, NAD NEURO:  Alert and oriented x 3, no focal deficits EYES: Right eyelid does not close and right eye deviates to the right (chronic) ENT/NECK:  no LAD, no JVD CARDIO: Regular rate, well-perfused, normal S1 and S2 PULM:  CTAB no wheezing or rhonchi GI/GU:  normal bowel sounds, non-distended, non-tender MSK/SPINE:  No gross deformities, no edema SKIN:  no rash, atraumatic PSYCH:  Appropriate speech and behavior  Diagnostic and Interventional Summary    EKG Interpretation  Date/Time:  Sunday December 10 2018 19:00:31 EDT Ventricular Rate:  109 PR Interval:  132 QRS Duration: 84 QT Interval:  320 QTC Calculation: 430 R Axis:   79 Text Interpretation:  Sinus tachycardia Right atrial enlargement  Borderline ECG Confirmed by Gerlene Fee [phone]) on 12/10/2018 9:23:08 PM      Labs Reviewed  URINALYSIS, ROUTINE W REFLEX MICROSCOPIC - Abnormal; Notable for the following components:      Result Value   Leukocytes,Ua SMALL (*)    All other components within normal limits  COMPREHENSIVE METABOLIC PANEL - Abnormal; Notable for the following components:   Sodium 124 (*)    Chloride 87 (*)    Albumin 3.4 (*)    All other components within normal limits  CBC - Abnormal; Notable for the following components:   Hemoglobin 11.5 (*)    HCT 34.3 (*)    MCV 70.1 (*)    MCH 23.5 (*)    All other components within normal limits  D-DIMER, QUANTITATIVE (NOT AT [facility]) - Abnormal; Notable for the following components:   D-Dimer, Quant 2.48 (*)    All other components within normal limits  URINALYSIS, MICROSCOPIC (REFLEX) - Abnormal; Notable for the following components:   Bacteria, UA MANY (*)    All other components within normal limits  SARS CORONAVIRUS 2 (TAT 6-24 HRS)  LACTIC ACID, PLASMA  TROPONIN I (HIGH SENSITIVITY)  TROPONIN I (HIGH SENSITIVITY)    DG Chest Port 1 View  Final Result    CT ANGIO CHEST PE W OR WO CONTRAST    (Results Pending)    Medications  sodium chloride 0.9 % bolus 500 mL (has no administration in time range)  fentaNYL (SUBLIMAZE) injection 25 mcg (25 mcg Intravenous Given 12/10/18 2013)  sodium chloride 0.9 % bolus 500 mL (0 mLs Intravenous Stopped 12/10/18 2100)     Procedures Critical Care  ED Course and Medical Decision Making  I have reviewed the triage vital signs and the nursing notes.  Pertinent labs & imaging results that were available during my care of the patient were reviewed by me and considered in my medical decision making (see below for details).  Considering ACS, also concern for PE given patient's metastatic cancer.  D-dimer is positive, will need CTA.  Patient is also hyponatremic and will likely need admission.  Unable to perform CTA  imaging at [facility], will transfer to [facility] for completion of work-up.  Dr. Darl Householder excepting.  Barth Kirks. [place] Small, [place] [email or web address]  Final Clinical Impressions(s) / ED Diagnoses     ICD-10-CM   1. Chest pain  R07.9 DG Chest [facility]  1 View    DG Chest Port 1 View    ED Discharge Orders    None      Discharge Instructions Discussed with and Provided to Patient: Discharge Instructions   None       Maudie Flakes, MD 12/10/18 2132

## 2018-12-10 NOTE — ED Triage Notes (Signed)
Pt admits to heavy drinking. States she has only had "a beer" today

## 2018-12-10 NOTE — ED Notes (Signed)
Report given to [facility] and to [place] O at [facility].  Leaving with [facility] at this time.

## 2018-12-11 ENCOUNTER — Ambulatory Visit (HOSPITAL_COMMUNITY): Payer: Medicaid Other

## 2018-12-11 DIAGNOSIS — I10 Essential (primary) hypertension: Secondary | ICD-10-CM

## 2018-12-11 DIAGNOSIS — E871 Hypo-osmolality and hyponatremia: Secondary | ICD-10-CM

## 2018-12-11 DIAGNOSIS — S2249XA Multiple fractures of ribs, unspecified side, initial encounter for closed fracture: Secondary | ICD-10-CM

## 2018-12-11 DIAGNOSIS — B20 Human immunodeficiency virus [HIV] disease: Secondary | ICD-10-CM

## 2018-12-11 DIAGNOSIS — F17210 Nicotine dependence, cigarettes, uncomplicated: Secondary | ICD-10-CM

## 2018-12-11 DIAGNOSIS — S2220XA Unspecified fracture of sternum, initial encounter for closed fracture: Secondary | ICD-10-CM | POA: Diagnosis not present

## 2018-12-11 DIAGNOSIS — F1010 Alcohol abuse, uncomplicated: Secondary | ICD-10-CM | POA: Diagnosis not present

## 2018-12-11 DIAGNOSIS — R079 Chest pain, unspecified: Secondary | ICD-10-CM | POA: Diagnosis not present

## 2018-12-11 DIAGNOSIS — S2239XA Fracture of one rib, unspecified side, initial encounter for closed fracture: Secondary | ICD-10-CM | POA: Diagnosis present

## 2018-12-11 LAB — CBC
HCT: 33.8 % — ABNORMAL LOW (ref 36.0–46.0)
Hemoglobin: 11.3 g/dL — ABNORMAL LOW (ref 12.0–15.0)
MCH: 23.7 pg — ABNORMAL LOW (ref 26.0–34.0)
MCHC: 33.4 g/dL (ref 30.0–36.0)
MCV: 70.9 fL — ABNORMAL LOW (ref 80.0–100.0)
Platelets: 177 10*3/uL (ref 150–400)
RBC: 4.77 MIL/uL (ref 3.87–5.11)
RDW: 15.6 % — ABNORMAL HIGH (ref 11.5–15.5)
WBC: 3.7 10*3/uL — ABNORMAL LOW (ref 4.0–10.5)
nRBC: 0 % (ref 0.0–0.2)

## 2018-12-11 LAB — BASIC METABOLIC PANEL
Anion gap: 14 (ref 5–15)
BUN: 13 mg/dL (ref 6–20)
CO2: 21 mmol/L — ABNORMAL LOW (ref 22–32)
Calcium: 9.1 mg/dL (ref 8.9–10.3)
Chloride: 90 mmol/L — ABNORMAL LOW (ref 98–111)
Creatinine, Ser: 0.73 mg/dL (ref 0.44–1.00)
GFR calc Af Amer: >60 mL/min (ref >60)
GFR calc non Af Amer: >60 mL/min (ref >60)
Glucose, Bld: 84 mg/dL (ref 70–99)
Potassium: 3.9 mmol/L (ref 3.5–5.1)
Sodium: 125 mmol/L — ABNORMAL LOW (ref 135–145)

## 2018-12-11 LAB — OSMOLALITY: Osmolality: 255 mOsm/kg — ABNORMAL LOW (ref 275–295)

## 2018-12-11 LAB — TSH: TSH: 1.331 u[IU]/mL (ref 0.350–4.500)

## 2018-12-11 LAB — SARS CORONAVIRUS 2 (TAT 6-24 HRS): SARS Coronavirus 2: NEGATIVE

## 2018-12-11 MED ORDER — FOLIC ACID 1 MG PO TABS: 1.0000 mg | ORAL_TABLET | Freq: Every day | ORAL | Status: DC

## 2018-12-11 MED ORDER — OXYCODONE-ACETAMINOPHEN 5-325 MG PO TABS: 1.0000 | ORAL_TABLET | Freq: Four times a day (QID) | ORAL | 0 refills | Status: DC | PRN

## 2018-12-11 MED ORDER — THIAMINE HCL 100 MG/ML IJ SOLN: 100.0000 mg | Freq: Every day | INTRAMUSCULAR | Status: DC

## 2018-12-11 MED ORDER — NICOTINE 21 MG/24HR TD PT24: 21.0000 mg | MEDICATED_PATCH | Freq: Every day | TRANSDERMAL | Status: DC

## 2018-12-11 MED ORDER — ACETAMINOPHEN 650 MG RE SUPP: 650.0000 mg | Freq: Four times a day (QID) | RECTAL | Status: DC | PRN

## 2018-12-11 MED ORDER — GABAPENTIN 300 MG PO CAPS: 300.0000 mg | ORAL_CAPSULE | Freq: Three times a day (TID) | ORAL | Status: DC

## 2018-12-11 MED ORDER — BICTEGRAVIR-EMTRICITAB-TENOFOV 50-200-25 MG PO TABS
1.0000 | ORAL_TABLET | Freq: Every day | ORAL | Status: DC
  Filled 2018-12-11: qty 1

## 2018-12-11 MED ORDER — VITAMIN B-1 100 MG PO TABS: 100.0000 mg | ORAL_TABLET | Freq: Every day | ORAL | Status: DC

## 2018-12-11 MED ORDER — HYDRALAZINE HCL 20 MG/ML IJ SOLN: 5.0000 mg | INTRAMUSCULAR | Status: DC | PRN

## 2018-12-11 MED ORDER — SODIUM CHLORIDE 0.9 % IV BOLUS
500.0000 mL | Freq: Once | INTRAVENOUS | Status: AC
  Administered 2018-12-11: 500 mL via INTRAVENOUS

## 2018-12-11 MED ORDER — LORAZEPAM 1 MG PO TABS: 1.0000 mg | ORAL_TABLET | Freq: Four times a day (QID) | ORAL | Status: DC | PRN

## 2018-12-11 MED ORDER — LORAZEPAM 2 MG/ML IJ SOLN: 0.0000 mg | Freq: Two times a day (BID) | INTRAMUSCULAR | Status: DC

## 2018-12-11 MED ORDER — SODIUM CHLORIDE 0.9 % IV SOLN: INTRAVENOUS | Status: DC

## 2018-12-11 MED ORDER — ENOXAPARIN SODIUM 40 MG/0.4ML ~~LOC~~ SOLN: 40.0000 mg | SUBCUTANEOUS | Status: DC

## 2018-12-11 MED ORDER — ADULT MULTIVITAMIN W/MINERALS CH: 1.0000 | ORAL_TABLET | Freq: Every day | ORAL | Status: DC

## 2018-12-11 MED ORDER — METOPROLOL TARTRATE 25 MG PO TABS: 12.5000 mg | ORAL_TABLET | Freq: Two times a day (BID) | ORAL | Status: DC

## 2018-12-11 MED ORDER — ACETAMINOPHEN 325 MG PO TABS: 650.0000 mg | ORAL_TABLET | Freq: Four times a day (QID) | ORAL | Status: DC | PRN

## 2018-12-11 MED ORDER — ONDANSETRON HCL 4 MG/2ML IJ SOLN: 4.0000 mg | Freq: Four times a day (QID) | INTRAMUSCULAR | Status: DC | PRN

## 2018-12-11 MED ORDER — LORAZEPAM 2 MG/ML IJ SOLN: 0.0000 mg | Freq: Four times a day (QID) | INTRAMUSCULAR | Status: DC

## 2018-12-11 MED ORDER — LORAZEPAM 2 MG/ML IJ SOLN: 1.0000 mg | Freq: Four times a day (QID) | INTRAMUSCULAR | Status: DC | PRN

## 2018-12-11 MED ORDER — MORPHINE SULFATE (PF) 2 MG/ML IV SOLN: 2.0000 mg | INTRAVENOUS | Status: DC | PRN

## 2018-12-11 MED ORDER — FENTANYL CITRATE (PF) 100 MCG/2ML IJ SOLN
50.0000 ug | Freq: Once | INTRAMUSCULAR | Status: AC
  Administered 2018-12-11: 50 ug via INTRAVENOUS
  Filled 2018-12-11: qty 2

## 2018-12-11 MED ORDER — CITALOPRAM HYDROBROMIDE 10 MG PO TABS: 10.0000 mg | ORAL_TABLET | Freq: Every day | ORAL | Status: DC

## 2018-12-11 MED ORDER — OXYCODONE-ACETAMINOPHEN 5-325 MG PO TABS: 1.0000 | ORAL_TABLET | Freq: Four times a day (QID) | ORAL | Status: DC | PRN

## 2018-12-11 MED ORDER — ONDANSETRON HCL 4 MG PO TABS: 4.0000 mg | ORAL_TABLET | Freq: Four times a day (QID) | ORAL | Status: DC | PRN

## 2018-12-11 NOTE — ED Notes (Signed)
Patient has received Heart Healthy Diet for Breakfast.

## 2018-12-11 NOTE — H&P (Signed)
History and Physical    Kristen Roman [id number] DOB: 03-19-1961 DOA: 12/10/2018  Referring MD/NP/PA:   PCP: [place]   Patient coming from:  The patient is coming from home.  At baseline, pt is independent for most of ADL.        Chief Complaint: chest pain  HPI: Kristen Roman is a 58 y.o. 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  Patient states that she has been having chest pain in the past 2 days, which is located in front of and right side of her chest.  The pain is constant, constant, 10 out of 10 in severity, sharp, nonradiating.  It is pleuritic, aggravated by deep breath and cough.  Patient has some mild shortness of breath and mild dry cough.  No fever or chills.  She states she has nausea, vomited twice without blood in the vomitus.  No diarrhea or abdominal pain.  No symptoms of UTI or unilateral weakness.  Patient denies recent fall.  She states that she had a fall several months ago.  ED Course: pt was found to have WBC 4.4, lactic acid 1.4, troponin 6, urinalysis not impressive, pending COVID-19 test, d-dimer 2.48, sodium 124, renal function normal, temperature normal, soft blood pressure, tachycardia, oxygen saturation 99% on room air.  Chest x-ray negative.  CT angiogram showed superior sternum fracture, rib fracture and metastasized lytic bone disease.  Patient is placed on telemetry bed for observation.  CTA of chest showed: 1. No central, segmental, or subsegmental pulmonary embolism. 2. Interval progression in mixed lytic and sclerotic osseous lesions throughout the right shoulder, chest wall and vertebral bodies. 3. New comminuted fracture seen through the superior sternum. 4. Right ninth and lateral fourth rib fractures. 5. Unchanged compression deformities throughout the  thoracic spine.  Review of Systems:   General: no fevers, chills, no body weight gain, has fatigue HEENT: no blurry vision, hearing changes or sore throat Respiratory: has dyspnea, coughing, no wheezing CV: has chest pain, no palpitations GI: has nausea, vomiting, no abdominal pain, diarrhea, constipation GU: no dysuria, burning on urination, increased urinary frequency, hematuria  Ext: no leg edema Neuro: no unilateral weakness, numbness, or tingling, no vision change or hearing loss Skin: no rash, no skin tear. MSK: No muscle spasm, no deformity, no limitation of range of movement in spin Heme: No easy bruising.  Travel history: No recent long distant travel.  Allergy:  Allergies  Allergen Reactions   Amoxicillin Itching   Penicillin G Itching    Did it involve swelling of the face/tongue/throat, SOB, or low BP? No Did it involve sudden or severe rash/hives, skin peeling, or any reaction on the inside of your mouth or nose? No Did you need to seek medical attention at a hospital or doctor's office? yes When did it last happen?recently If all above answers are NO, may proceed with cephalosporin use.    Past Medical History:  Diagnosis Date   Cancer [facility])    "bone cancer"   Hemorrhoid    HIV disease ([place])    Hypertension     Past Surgical History:  Procedure Laterality Date   ABDOMINAL HYSTERECTOMY     CESAREAN SECTION      Social History:  reports that she has been smoking cigarettes. She started smoking about 43 years ago. She has a 40.00 pack-year smoking history.  She has never used smokeless tobacco. She reports current alcohol use of about 100.0 standard drinks of alcohol per week. She reports current drug use. Frequency: 3.00 times per week. Drug: Marijuana.  Family History:  Family History  Problem Relation Age of Onset   Kidney disease Mother      Prior to Admission medications   Medication Sig Start Date End Date Taking? Authorizing  Provider  acetaminophen (TYLENOL) 500 MG tablet Take 1,000 mg by mouth every 6 (six) hours as needed. Patient used this medication for pain.    [provider]  Aspirin-Acetaminophen-Caffeine (GOODY HEADACHE PO) Take 1 packet by mouth daily as needed. Patient states that she uses this medication for every pain that she has.    [provider]  bictegravir-emtricitabine-tenofovir AF (BIKTARVY) 50-200-25 MG TABS tablet Take 1 tablet by mouth daily. Take 1 tablet by mouth daily    [provider]  citalopram (CELEXA) 10 MG tablet TAKE 1 TABLET BY MOUTH EVERY DAY 02/21/14   Carlyle Basques, MD  cyclobenzaprine (FLEXERIL) 5 MG tablet Take 5 mg by mouth 3 (three) times daily as needed for muscle spasms. 10/31/18   [provider]  feeding supplement, ENSURE ENLIVE, (ENSURE ENLIVE) LIQD Take 237 mLs by mouth 2 (two) times daily between meals. 11/09/18   Swayze, Ava, DO  gabapentin (NEURONTIN) 300 MG capsule Take 1 capsule (300 mg total) by mouth 3 (three) times daily. 10/23/18   Lajean Saver, MD  metoprolol tartrate (LOPRESSOR) 25 MG tablet Take 0.5 tablets (12.5 mg total) by mouth 2 (two) times daily. 11/09/18   Swayze, Ava, DO  oxyCODONE-acetaminophen (PERCOCET/ROXICET) 5-325 MG tablet Take 1-2 tablets by mouth every 6 (six) hours as needed for severe pain. 10/23/18   Lajean Saver, MD    Physical Exam: Vitals:   12/10/18 2000 12/10/18 2052 12/10/18 2245 12/10/18 2300  BP: 95/81 108/76 103/82 125/82  Pulse: 94 90    Resp:  18 18 17   Temp:      TempSrc:      SpO2: 100% 100%    Weight:      Height:       General: Not in acute distress HEENT:       Eyes: PERRL, EOMI, no scleral icterus.       ENT: No discharge from the ears and nose, no pharynx injection, no tonsillar enlargement.        Neck: No JVD, no bruit, no mass felt. Heme: No neck lymph node enlargement. Cardiac: S1/S2, RRR, No murmurs, No gallops or rubs. Respiratory:  No rales, wheezing, rhonchi or  rubs. Chest wall: pt has chest wall tenderness in front and right lateral side. GI: Soft, nondistended, nontender, no rebound pain, no organomegaly, BS present. GU: No hematuria Ext: No pitting leg edema bilaterally. 2+DP/PT pulse bilaterally. Musculoskeletal: No joint deformities, No joint redness or warmth, no limitation of ROM in spin. Skin: No rashes.  Neuro: Alert, oriented X3, cranial nerves II-XII grossly intact, moves all extremities normally. Psych: Patient is not psychotic, no suicidal or hemocidal ideation.  Labs on Admission: I have personally reviewed following labs and imaging studies  CBC: Recent Labs  Lab 12/10/18 2034  WBC 4.4  HGB 11.5*  HCT 34.3*  MCV 70.1*  PLT 99991111   Basic Metabolic Panel: Recent Labs  Lab 12/10/18 2034 12/11/18 0038  NA 124* 125*  K 4.2 3.9  CL 87* 90*  CO2 23 21*  GLUCOSE 83 84  BUN 16 13  CREATININE 0.82 0.73  CALCIUM 9.4 9.1   GFR: Estimated Creatinine Clearance: 69 mL/min (by C-G formula based on SCr of 0.73 mg/dL). Liver Function Tests: Recent Labs  Lab 12/10/18 2034  AST 27  ALT 12  ALKPHOS 114  BILITOT 0.4  PROT 6.9  ALBUMIN 3.4*   No results for input(s): LIPASE, AMYLASE in the last 168 hours. No results for input(s): AMMONIA in the last 168 hours. Coagulation Profile: No results for input(s): INR, PROTIME in the last 168 hours. Cardiac Enzymes: No results for input(s): CKTOTAL, CKMB, CKMBINDEX, TROPONINI in the last 168 hours. BNP (last 3 results) No results for input(s): PROBNP in the last 8760 hours. HbA1C: No results for input(s): HGBA1C in the last 72 hours. CBG: No results for input(s): GLUCAP in the last 168 hours. Lipid Profile: No results for input(s): CHOL, HDL, LDLCALC, TRIG, CHOLHDL, LDLDIRECT in the last 72 hours. Thyroid Function Tests: No results for input(s): TSH, T4TOTAL, FREET4, T3FREE, THYROIDAB in the last 72 hours. Anemia Panel: No results for input(s): VITAMINB12, FOLATE, FERRITIN,  TIBC, IRON, RETICCTPCT in the last 72 hours. Urine analysis:    Component Value Date/Time   COLORURINE YELLOW 12/10/2018 2107   APPEARANCEUR CLEAR 12/10/2018 2107   LABSPEC 1.015 12/10/2018 2107   PHURINE 5.0 12/10/2018 2107   GLUCOSEU NEGATIVE 12/10/2018 2107   GLUCOSEU NEG mg/dL 12/29/2006 2001   HGBUR NEGATIVE 12/10/2018 2107   BILIRUBINUR NEGATIVE 12/10/2018 2107   [place] NEGATIVE 12/10/2018 2107   PROTEINUR NEGATIVE 12/10/2018 2107   UROBILINOGEN 1 12/29/2006 2001   NITRITE NEGATIVE 12/10/2018 2107   LEUKOCYTESUR SMALL (A) 12/10/2018 2107   Sepsis Labs: @LABRCNTIP (procalcitonin:4,lacticidven:4) )No results found for this or any previous visit (from the past 240 hour(s)).   Radiological Exams on Admission: Ct Angio Chest Pe W Or Wo Contrast  Result Date: 12/11/2018 CLINICAL DATA:  Chest pain, history of hypertension, HIV and bone cancer EXAM: CT ANGIOGRAPHY CHEST WITH CONTRAST TECHNIQUE: Multidetector CT imaging of the chest was performed using the standard protocol during bolus administration of intravenous contrast. Multiplanar CT image reconstructions and MIPs were obtained to evaluate the vascular anatomy. CONTRAST:  7mL OMNIPAQUE IOHEXOL 350 MG/ML SOLN COMPARISON:  None. FINDINGS: Cardiovascular: There is a optimal opacification of the pulmonary arteries. There is no central,segmental, or subsegmental filling defects within the pulmonary arteries. The heart is normal in size. No pericardial effusion thickening. No evidence right heart strain. There is normal three-vessel brachiocephalic anatomy without proximal stenosis. The thoracic aorta is normal in appearance. Mediastinum/Nodes: No hilar, mediastinal, or axillary adenopathy. Thyroid gland, trachea, and esophagus demonstrate no significant findings. Lungs/Pleura: Mild hazy basilar atelectasis is seen. No large airspace consolidation or pleural effusion. Upper Abdomen: No acute abnormalities present in the visualized portions  of the upper abdomen. Musculoskeletal: Again noted are mixed sclerotic and lytic lesions throughout the osseous structures as the prior exam, which have progressed since the prior exam. This is throughout the right shoulder, ribs, vertebral bodies, anterior sternum. There is new comminuted fracture seen through the superior sternum. There is a healed posterior right ninth and lateral right fourth rib fractures. There is unchanged superior compression deformities of the T3, T5, T6, T7, T8, T10, and T12 vertebral bodies. Review of the MIP images confirms the above findings. IMPRESSION: 1. No central, segmental, or subsegmental pulmonary embolism. 2. Interval progression in mixed lytic and sclerotic osseous lesions throughout the right shoulder, chest wall and vertebral bodies. 3. New comminuted fracture seen through the superior sternum. 4. Right ninth and lateral fourth rib  fractures. 5. Unchanged compression deformities throughout the thoracic spine. Electronically Signed   By: Prudencio Pair M.D.   On: 12/11/2018 00:07   Dg Chest Port 1 View  Result Date: 12/10/2018 CLINICAL DATA:  Smoker, anxiety EXAM: PORTABLE CHEST 1 VIEW COMPARISON:  November 06, 2018 FINDINGS: The heart size and mediastinal contours are within normal limits. Both lungs are clear. Again noted are advanced right glenohumeral joint osteoarthritis. IMPRESSION: No acute cardiopulmonary process. Electronically Signed   By: Prudencio Pair M.D.   On: 12/10/2018 20:16     EKG: Independently reviewed.  Sinus rhythm, QTC 420, LAE, early R wave progression.  Assessment/Plan Principal Problem:   Chest pain Active Problems:   HTN (hypertension)   Cigarette smoker   Depression   Hyponatremia   Primary adenocarcinoma of parotid gland (HCC)   Alcohol abuse   Closed fracture of sternum   Rib fracture   HIV disease (HCC)   Chest pain: pt's chest pain is most likely due to chest wall pain 2/2 fractured superior sternum and right rib as evidenced  by CT angiogram. No PE by CTA. Troponin negative.  -will place on tele bed for obs -pain control: PRN Percocet and morphine -Incentive spirometry -get one more trop -f/u LE doppler to r/o DVT due to positive D-dimer  Closed fracture of sternum and Rib fracture: -see above   HTN:  -Continue home medications: Metoprolol -IV hydralazine prn  HIV: CD4=610 on 12/2012, VL 53 on 03/23/13 -continue home Biktarvy  Primary adenocarcinoma of parotid gland: Metastasized to bone. CTA today showed interval progression in mixed lytic and sclerotic osseous lesions throughout the right shoulder, chest wall and vertebral bodies. She had radiation therapy per patient. -f/u at [facility].  Tobacco abuse and Alcohol abuse: -Did counseling about importance of quitting smoking and drinking -Nicotine patch -CIWA protocol  Depression: -celexa  Hyponatremia: Na 124. This a chronic issue. Mental status normal.  Seems to be hypovolemic hyponatremia. Most likely due to dehydration and potomania 2/2 alcohol abuse - Will check urine sodium, urine osmolality, serum osmolality. - check TSH - Fluid restriction - IVF: 500 x 3 NS in ED, will continue with IV normal saline at 75 mL/h - f/u by BMP - avoid over correction too fast due to risk of central pontine myelinolysis   DVT ppx: SQ Lovenox Code Status: Full code Family Communication: None at bed side.   Disposition Plan:  Anticipate discharge back to previous home environment Consults called:  none Admission status: Obs / tele  Date of Service 12/11/2018    [facility]   If 7PM-7AM, please contact night-coverage [email or web address] Password [email or web address] 12/11/2018, 4:09 AM

## 2018-12-11 NOTE — ED Notes (Signed)
Ordered breakfast--Kristen Roman 

## 2018-12-11 NOTE — Discharge Summary (Signed)
Physician Discharge Summary  Kristen Roman [id number] DOB: February 05, 1961 DOA: 12/10/2018  PCP: [place] date: 12/10/2018 Discharge date: 12/11/2018  Time spent: 40 minutes  Recommendations for Outpatient Follow-up:  1. Follow up with PCP 1-2 weeks for evaluation of symptoms. Recommend bmet to track sodium level 2. Follow up with oncology as scheduled   Discharge Diagnoses:  Principal Problem:   Chest pain Active Problems:   Hyponatremia   Closed fracture of sternum   Rib fracture   HTN (hypertension)   Cigarette smoker   Primary adenocarcinoma of parotid gland ([place])   Alcohol abuse   HIV disease ([place])   Depression   Discharge Condition: stable  Diet recommendation: heart healthy  Filed Weights   12/10/18 1901  Weight: 61.2 kg    History of present illness:  Kristen Roman is a 58 y.o. female with medical history significant of hypertension, HIV,adenocarcinoma of the parotid glandwith metastases to bone diagnosed 09/2018, depression, alcohol abuse, tobacco abuse, who presented 12/10/18 with chest pain.  Patient stated that she had been having chest pain prior 2 days, which is located in front of and right side of her chest.  The pain was constant, rated 10 out of 10 in severity, sharp, nonradiating.  It is pleuritic, aggravated by deep breath and cough.  Patient had some mild shortness of breath and mild dry cough.  No fever or chills.  She stated she had nausea, vomited twice without blood in the vomitus.  No diarrhea or abdominal pain.  No symptoms of UTI or unilateral weakness.  Patient denied recent fall.  She stated that she had a fall several months ago.  Hospital Course:  Chest pain: pt's chest pain is most likely due to chest wall pain 2/2 fractured superior sternum and right rib as evidenced by CT angiogram. No PE by CTA. Troponin negative. No events on tele. EKG with NSR.  Discharge with po pain med recommendations to mobilize and follow up  with PCP 1-2 weeks for evaluation of symptoms  Closed fracture of sternum and Rib fracture: -see above   HTN: controlled.   HIV: CD4=610 on 12/2012, VL 53 on 03/23/13. continue home [place]  Primary adenocarcinoma of parotid gland: Metastasized to bone. CTA today showed interval progression in mixed lytic and sclerotic osseous lesions throughout the right shoulder, chest wall and vertebral bodies. She had radiation therapy per patient. -f/u at [facility].  Tobacco abuse and Alcohol abuse: -Did counseling about importance of quitting smoking and drinking   Depression: -celexa  Hyponatremia: Na 124 on admission. Trending up at discharge.  This a chronic issue. Mental status normal.  Seems to be hypovolemic hyponatremia. Most likely due to dehydration and potomania 2/2 alcohol abuse. Follow up with PCP 1-2 weeks for bmet   Procedures:  Consultations:    Discharge Exam: Vitals:   12/11/18 1206 12/11/18 1218  BP:  (!) 141/83  Pulse: 76   Resp: 18 19  Temp:    SpO2: 100%     General: awake alert no acute distress Cardiovascular: rrr no mgr no LE edema Respiratory: normal effort BS clear bilaterally no wheeze  Discharge Instructions   Discharge Instructions    Diet - low sodium heart healthy   Complete by: As directed    Increase activity slowly   Complete by: As directed      Allergies as of 12/11/2018      Reactions   Amoxicillin Itching   Penicillin G Itching  Did it involve swelling of the face/tongue/throat, SOB, or low BP? No Did it involve sudden or severe rash/hives, skin peeling, or any reaction on the inside of your mouth or nose? No Did you need to seek medical attention at a hospital or doctor's office? yes When did it last happen?recently If all above answers are "NO", may proceed with cephalosporin use.      Medication List    TAKE these medications   acetaminophen 500 MG tablet Commonly known as: TYLENOL Take 1,000 mg by mouth  every 6 (six) hours as needed. Patient used this medication for pain.   Biktarvy 50-200-25 MG Tabs tablet Generic drug: bictegravir-emtricitabine-tenofovir AF Take 1 tablet by mouth daily.   citalopram 10 MG tablet Commonly known as: CELEXA TAKE 1 TABLET BY MOUTH EVERY DAY   feeding supplement (ENSURE ENLIVE) Liqd Take 237 mLs by mouth 2 (two) times daily between meals.   gabapentin 300 MG capsule Commonly known as: Neurontin Take 1 capsule (300 mg total) by mouth 3 (three) times daily.   metoprolol tartrate 25 MG tablet Commonly known as: LOPRESSOR Take 0.5 tablets (12.5 mg total) by mouth 2 (two) times daily.   oxyCODONE-acetaminophen 5-325 MG tablet Commonly known as: PERCOCET/ROXICET Take 1-2 tablets by mouth every 6 (six) hours as needed for severe pain.      Allergies  Allergen Reactions  . Amoxicillin Itching  . Penicillin G Itching    Did it involve swelling of the face/tongue/throat, SOB, or low BP? No Did it involve sudden or severe rash/hives, skin peeling, or any reaction on the inside of your mouth or nose? No Did you need to seek medical attention at a hospital or doctor's office? yes When did it last happen?recently If all above answers are "NO", may proceed with cephalosporin use.   [place]. Schedule an appointment as soon as possible for a visit in 1 week(s).   Specialty: Internal Medicine Contact information: [id number] [place] [address] [phone]        Thayer Headings, MD .   Specialty: Infectious Diseases Contact information: [address] [place] [address] [phone]            The results of significant diagnostics from this hospitalization (including imaging, microbiology, ancillary and laboratory) are listed below for reference.    Significant Diagnostic Studies: Ct Angio Chest Pe W Or Wo Contrast  Result Date: 12/11/2018 CLINICAL  DATA:  Chest pain, history of hypertension, HIV and bone cancer EXAM: CT ANGIOGRAPHY CHEST WITH CONTRAST TECHNIQUE: Multidetector CT imaging of the chest was performed using the standard protocol during bolus administration of intravenous contrast. Multiplanar CT image reconstructions and MIPs were obtained to evaluate the vascular anatomy. CONTRAST:  38mL OMNIPAQUE IOHEXOL 350 MG/ML SOLN COMPARISON:  None. FINDINGS: Cardiovascular: There is a optimal opacification of the pulmonary arteries. There is no central,segmental, or subsegmental filling defects within the pulmonary arteries. The heart is normal in size. No pericardial effusion thickening. No evidence right heart strain. There is normal three-vessel brachiocephalic anatomy without proximal stenosis. The thoracic aorta is normal in appearance. Mediastinum/Nodes: No hilar, mediastinal, or axillary adenopathy. Thyroid gland, trachea, and esophagus demonstrate no significant findings. Lungs/Pleura: Mild hazy basilar atelectasis is seen. No large airspace consolidation or pleural effusion. Upper Abdomen: No acute abnormalities present in the visualized portions of the upper abdomen. Musculoskeletal: Again noted are mixed sclerotic and lytic lesions throughout the osseous structures as  the prior exam, which have progressed since the prior exam. This is throughout the right shoulder, ribs, vertebral bodies, anterior sternum. There is new comminuted fracture seen through the superior sternum. There is a healed posterior right ninth and lateral right fourth rib fractures. There is unchanged superior compression deformities of the T3, T5, T6, T7, T8, T10, and T12 vertebral bodies. Review of the MIP images confirms the above findings. IMPRESSION: 1. No central, segmental, or subsegmental pulmonary embolism. 2. Interval progression in mixed lytic and sclerotic osseous lesions throughout the right shoulder, chest wall and vertebral bodies. 3. New comminuted fracture seen  through the superior sternum. 4. Right ninth and lateral fourth rib fractures. 5. Unchanged compression deformities throughout the thoracic spine. Electronically Signed   By: Prudencio Pair M.D.   On: 12/11/2018 00:07   Dg Chest Port 1 View  Result Date: 12/10/2018 CLINICAL DATA:  Smoker, anxiety EXAM: PORTABLE CHEST 1 VIEW COMPARISON:  November 06, 2018 FINDINGS: The heart size and mediastinal contours are within normal limits. Both lungs are clear. Again noted are advanced right glenohumeral joint osteoarthritis. IMPRESSION: No acute cardiopulmonary process. Electronically Signed   By: Prudencio Pair M.D.   On: 12/10/2018 20:16    Microbiology: No results found for this or any previous visit (from the past 240 hour(s)).   Labs: Basic Metabolic Panel: Recent Labs  Lab 12/10/18 2034 12/11/18 0038  NA 124* 125*  K 4.2 3.9  CL 87* 90*  CO2 23 21*  GLUCOSE 83 84  BUN 16 13  CREATININE 0.82 0.73  CALCIUM 9.4 9.1   Liver Function Tests: Recent Labs  Lab 12/10/18 2034  AST 27  ALT 12  ALKPHOS 114  BILITOT 0.4  PROT 6.9  ALBUMIN 3.4*   No results for input(s): LIPASE, AMYLASE in the last 168 hours. No results for input(s): AMMONIA in the last 168 hours. CBC: Recent Labs  Lab 12/10/18 2034 12/11/18 1149  WBC 4.4 3.7*  HGB 11.5* 11.3*  HCT 34.3* 33.8*  MCV 70.1* 70.9*  PLT 189 177   Cardiac Enzymes: No results for input(s): CKTOTAL, CKMB, CKMBINDEX, TROPONINI in the last 168 hours. BNP: BNP (last 3 results) No results for input(s): BNP in the last 8760 hours.  ProBNP (last 3 results) No results for input(s): PROBNP in the last 8760 hours.  CBG: No results for input(s): GLUCAP in the last 168 hours.     SignedRadene Gunning NP  [facility] 12/11/2018, 12:42 PM

## 2019-02-02 ENCOUNTER — Emergency Department (HOSPITAL_BASED_OUTPATIENT_CLINIC_OR_DEPARTMENT_OTHER): Payer: Medicaid Other

## 2019-02-02 ENCOUNTER — Emergency Department (HOSPITAL_BASED_OUTPATIENT_CLINIC_OR_DEPARTMENT_OTHER)
Admission: EM | Admit: 2019-02-02 | Discharge: 2019-02-03 | Disposition: A | Discharge status: Home / Self Care | Payer: Medicaid Other | Attending: Emergency Medicine | Admitting: Emergency Medicine

## 2019-02-02 ENCOUNTER — Other Ambulatory Visit: Payer: Self-pay

## 2019-02-02 ENCOUNTER — Encounter (HOSPITAL_BASED_OUTPATIENT_CLINIC_OR_DEPARTMENT_OTHER): Payer: Self-pay | Admitting: *Deleted

## 2019-02-02 DIAGNOSIS — F17210 Nicotine dependence, cigarettes, uncomplicated: Secondary | ICD-10-CM | POA: Insufficient documentation

## 2019-02-02 DIAGNOSIS — Z21 Asymptomatic human immunodeficiency virus [HIV] infection status: Secondary | ICD-10-CM | POA: Insufficient documentation

## 2019-02-02 DIAGNOSIS — Z79899 Other long term (current) drug therapy: Secondary | ICD-10-CM | POA: Diagnosis not present

## 2019-02-02 DIAGNOSIS — C7951 Secondary malignant neoplasm of bone: Secondary | ICD-10-CM | POA: Diagnosis not present

## 2019-02-02 DIAGNOSIS — R0789 Other chest pain: Secondary | ICD-10-CM | POA: Diagnosis present

## 2019-02-02 DIAGNOSIS — D509 Iron deficiency anemia, unspecified: Secondary | ICD-10-CM | POA: Insufficient documentation

## 2019-02-02 DIAGNOSIS — I10 Essential (primary) hypertension: Secondary | ICD-10-CM | POA: Insufficient documentation

## 2019-02-02 DIAGNOSIS — C07 Malignant neoplasm of parotid gland: Secondary | ICD-10-CM | POA: Insufficient documentation

## 2019-02-02 LAB — BASIC METABOLIC PANEL
Anion gap: 14 (ref 5–15)
BUN: 17 mg/dL (ref 6–20)
CO2: 20 mmol/L — ABNORMAL LOW (ref 22–32)
Calcium: 9.3 mg/dL (ref 8.9–10.3)
Chloride: 93 mmol/L — ABNORMAL LOW (ref 98–111)
Creatinine, Ser: 1.22 mg/dL — ABNORMAL HIGH (ref 0.44–1.00)
GFR calc Af Amer: 57 mL/min — ABNORMAL LOW (ref >60)
GFR calc non Af Amer: 49 mL/min — ABNORMAL LOW (ref >60)
Glucose, Bld: 78 mg/dL (ref 70–99)
Potassium: 4.3 mmol/L (ref 3.5–5.1)
Sodium: 127 mmol/L — ABNORMAL LOW (ref 135–145)

## 2019-02-02 LAB — CBC
HCT: 26.1 % — ABNORMAL LOW (ref 36.0–46.0)
Hemoglobin: 8.5 g/dL — ABNORMAL LOW (ref 12.0–15.0)
MCH: 23.5 pg — ABNORMAL LOW (ref 26.0–34.0)
MCHC: 32.6 g/dL (ref 30.0–36.0)
MCV: 72.1 fL — ABNORMAL LOW (ref 80.0–100.0)
Platelets: 118 10*3/uL — ABNORMAL LOW (ref 150–400)
RBC: 3.62 MIL/uL — ABNORMAL LOW (ref 3.87–5.11)
RDW: 18.4 % — ABNORMAL HIGH (ref 11.5–15.5)
WBC: 4.4 10*3/uL (ref 4.0–10.5)
nRBC: 1.1 % — ABNORMAL HIGH (ref 0.0–0.2)

## 2019-02-02 LAB — TROPONIN I (HIGH SENSITIVITY): Troponin I (High Sensitivity): 4 ng/L (ref <18)

## 2019-02-02 MED ORDER — IOHEXOL 300 MG/ML  SOLN: 75.0000 mL | Freq: Once | INTRAMUSCULAR | Status: DC | PRN

## 2019-02-02 MED ORDER — SODIUM CHLORIDE 0.9% FLUSH
3.0000 mL | Freq: Once | INTRAVENOUS | Status: DC
  Filled 2019-02-02: qty 3

## 2019-02-02 MED ORDER — ALUM & MAG HYDROXIDE-SIMETH 200-200-20 MG/5ML PO SUSP
30.0000 mL | Freq: Once | ORAL | Status: AC
  Administered 2019-02-03: 30 mL via ORAL
  Filled 2019-02-02: qty 30

## 2019-02-02 MED ORDER — KETOROLAC TROMETHAMINE 30 MG/ML IJ SOLN
15.0000 mg | Freq: Once | INTRAMUSCULAR | Status: AC
  Administered 2019-02-03: 15 mg via INTRAVENOUS
  Filled 2019-02-02: qty 1

## 2019-02-02 NOTE — ED Triage Notes (Signed)
Chest pain x 2 days. Headache. To ED via EMT.

## 2019-02-03 LAB — TROPONIN I (HIGH SENSITIVITY): Troponin I (High Sensitivity): 4 ng/L (ref <18)

## 2019-02-03 MED ORDER — IOHEXOL 350 MG/ML SOLN
75.0000 mL | Freq: Once | INTRAVENOUS | Status: AC | PRN
  Administered 2019-02-03: 75 mL via INTRAVENOUS

## 2019-02-03 MED ORDER — LIDOCAINE 5 % EX PTCH: 1.0000 | MEDICATED_PATCH | CUTANEOUS | 0 refills | Status: DC

## 2019-02-03 MED ORDER — FERROUS SULFATE 325 (65 FE) MG PO TABS: 325.0000 mg | ORAL_TABLET | Freq: Two times a day (BID) | ORAL | 0 refills | Status: DC

## 2019-02-03 NOTE — ED Notes (Signed)
Pt returned from CT at this time.  
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
## 2019-02-03 NOTE — ED Notes (Signed)
Pt given cab voucher for ride home 

## 2019-02-06 ENCOUNTER — Other Ambulatory Visit: Payer: Self-pay

## 2019-02-06 ENCOUNTER — Inpatient Hospital Stay (HOSPITAL_BASED_OUTPATIENT_CLINIC_OR_DEPARTMENT_OTHER)
Admission: EM | Admit: 2019-02-06 | Discharge: 2019-02-11 | DRG: 682 | Disposition: A | Discharge status: Home / Self Care | Payer: Medicaid Other | Attending: Internal Medicine | Admitting: Internal Medicine

## 2019-02-06 ENCOUNTER — Emergency Department (HOSPITAL_BASED_OUTPATIENT_CLINIC_OR_DEPARTMENT_OTHER): Payer: Medicaid Other

## 2019-02-06 ENCOUNTER — Encounter (HOSPITAL_BASED_OUTPATIENT_CLINIC_OR_DEPARTMENT_OTHER): Payer: Self-pay

## 2019-02-06 DIAGNOSIS — Z79891 Long term (current) use of opiate analgesic: Secondary | ICD-10-CM

## 2019-02-06 DIAGNOSIS — Z841 Family history of disorders of kidney and ureter: Secondary | ICD-10-CM

## 2019-02-06 DIAGNOSIS — F1490 Cocaine use, unspecified, uncomplicated: Secondary | ICD-10-CM | POA: Diagnosis present

## 2019-02-06 DIAGNOSIS — R05 Cough: Secondary | ICD-10-CM

## 2019-02-06 DIAGNOSIS — F32A Depression, unspecified: Secondary | ICD-10-CM | POA: Diagnosis present

## 2019-02-06 DIAGNOSIS — R7402 Elevation of levels of lactic acid dehydrogenase (LDH): Secondary | ICD-10-CM | POA: Diagnosis present

## 2019-02-06 DIAGNOSIS — R059 Cough, unspecified: Secondary | ICD-10-CM

## 2019-02-06 DIAGNOSIS — R627 Adult failure to thrive: Secondary | ICD-10-CM | POA: Diagnosis present

## 2019-02-06 DIAGNOSIS — R439 Unspecified disturbances of smell and taste: Secondary | ICD-10-CM | POA: Diagnosis present

## 2019-02-06 DIAGNOSIS — R112 Nausea with vomiting, unspecified: Secondary | ICD-10-CM | POA: Diagnosis present

## 2019-02-06 DIAGNOSIS — Z923 Personal history of irradiation: Secondary | ICD-10-CM

## 2019-02-06 DIAGNOSIS — C07 Malignant neoplasm of parotid gland: Secondary | ICD-10-CM | POA: Diagnosis present

## 2019-02-06 DIAGNOSIS — E861 Hypovolemia: Secondary | ICD-10-CM | POA: Diagnosis present

## 2019-02-06 DIAGNOSIS — J189 Pneumonia, unspecified organism: Secondary | ICD-10-CM | POA: Diagnosis present

## 2019-02-06 DIAGNOSIS — E871 Hypo-osmolality and hyponatremia: Secondary | ICD-10-CM | POA: Diagnosis present

## 2019-02-06 DIAGNOSIS — Z880 Allergy status to penicillin: Secondary | ICD-10-CM

## 2019-02-06 DIAGNOSIS — R110 Nausea: Secondary | ICD-10-CM

## 2019-02-06 DIAGNOSIS — C7951 Secondary malignant neoplasm of bone: Secondary | ICD-10-CM | POA: Diagnosis present

## 2019-02-06 DIAGNOSIS — G510 Bell's palsy: Secondary | ICD-10-CM | POA: Diagnosis present

## 2019-02-06 DIAGNOSIS — D696 Thrombocytopenia, unspecified: Secondary | ICD-10-CM | POA: Diagnosis present

## 2019-02-06 DIAGNOSIS — D649 Anemia, unspecified: Secondary | ICD-10-CM

## 2019-02-06 DIAGNOSIS — I10 Essential (primary) hypertension: Secondary | ICD-10-CM | POA: Diagnosis present

## 2019-02-06 DIAGNOSIS — N179 Acute kidney failure, unspecified: Principal | ICD-10-CM | POA: Diagnosis present

## 2019-02-06 DIAGNOSIS — B20 Human immunodeficiency virus [HIV] disease: Secondary | ICD-10-CM | POA: Diagnosis present

## 2019-02-06 DIAGNOSIS — Z20828 Contact with and (suspected) exposure to other viral communicable diseases: Secondary | ICD-10-CM | POA: Diagnosis present

## 2019-02-06 DIAGNOSIS — G8929 Other chronic pain: Secondary | ICD-10-CM | POA: Diagnosis present

## 2019-02-06 DIAGNOSIS — E872 Acidosis: Secondary | ICD-10-CM | POA: Diagnosis present

## 2019-02-06 DIAGNOSIS — F1010 Alcohol abuse, uncomplicated: Secondary | ICD-10-CM | POA: Diagnosis present

## 2019-02-06 DIAGNOSIS — Z6822 Body mass index (BMI) 22.0-22.9, adult: Secondary | ICD-10-CM

## 2019-02-06 DIAGNOSIS — Z79899 Other long term (current) drug therapy: Secondary | ICD-10-CM

## 2019-02-06 DIAGNOSIS — E222 Syndrome of inappropriate secretion of antidiuretic hormone: Secondary | ICD-10-CM | POA: Diagnosis present

## 2019-02-06 DIAGNOSIS — F329 Major depressive disorder, single episode, unspecified: Secondary | ICD-10-CM | POA: Diagnosis present

## 2019-02-06 DIAGNOSIS — F17210 Nicotine dependence, cigarettes, uncomplicated: Secondary | ICD-10-CM | POA: Diagnosis present

## 2019-02-06 DIAGNOSIS — Z90710 Acquired absence of both cervix and uterus: Secondary | ICD-10-CM

## 2019-02-06 DIAGNOSIS — D72819 Decreased white blood cell count, unspecified: Secondary | ICD-10-CM | POA: Diagnosis present

## 2019-02-06 DIAGNOSIS — D500 Iron deficiency anemia secondary to blood loss (chronic): Secondary | ICD-10-CM | POA: Diagnosis present

## 2019-02-06 DIAGNOSIS — I959 Hypotension, unspecified: Secondary | ICD-10-CM | POA: Diagnosis present

## 2019-02-06 LAB — CBC WITH DIFFERENTIAL/PLATELET
Abs Immature Granulocytes: 0.24 10*3/uL — ABNORMAL HIGH (ref 0.00–0.07)
Abs Immature Granulocytes: 0.21 10*3/uL — ABNORMAL HIGH (ref 0.00–0.07)
Basophils Absolute: 0 10*3/uL (ref 0.0–0.1)
Basophils Absolute: 0 10*3/uL (ref 0.0–0.1)
Basophils Relative: 1 %
Basophils Relative: 0 %
Eosinophils Absolute: 0 10*3/uL (ref 0.0–0.5)
Eosinophils Absolute: 0 10*3/uL (ref 0.0–0.5)
Eosinophils Relative: 1 %
Eosinophils Relative: 1 %
HCT: 23.4 % — ABNORMAL LOW (ref 36.0–46.0)
HCT: 22 % — ABNORMAL LOW (ref 36.0–46.0)
Hemoglobin: 7.8 g/dL — ABNORMAL LOW (ref 12.0–15.0)
Hemoglobin: 7.1 g/dL — ABNORMAL LOW (ref 12.0–15.0)
Immature Granulocytes: 6 %
Immature Granulocytes: 6 %
Lymphocytes Relative: 38 %
Lymphocytes Relative: 38 %
Lymphs Abs: 1.5 10*3/uL (ref 0.7–4.0)
Lymphs Abs: 1.2 10*3/uL (ref 0.7–4.0)
MCH: 23.5 pg — ABNORMAL LOW (ref 26.0–34.0)
MCH: 23.3 pg — ABNORMAL LOW (ref 26.0–34.0)
MCHC: 33.3 g/dL (ref 30.0–36.0)
MCHC: 32.3 g/dL (ref 30.0–36.0)
MCV: 70.5 fL — ABNORMAL LOW (ref 80.0–100.0)
MCV: 72.1 fL — ABNORMAL LOW (ref 80.0–100.0)
Monocytes Absolute: 0.7 10*3/uL (ref 0.1–1.0)
Monocytes Absolute: 0.6 10*3/uL (ref 0.1–1.0)
Monocytes Relative: 17 %
Monocytes Relative: 17 %
Neutro Abs: 1.4 10*3/uL — ABNORMAL LOW (ref 1.7–7.7)
Neutro Abs: 1.3 10*3/uL — ABNORMAL LOW (ref 1.7–7.7)
Neutrophils Relative %: 37 %
Neutrophils Relative %: 38 %
Platelets: 93 10*3/uL — ABNORMAL LOW (ref 150–400)
Platelets: 81 10*3/uL — ABNORMAL LOW (ref 150–400)
RBC: 3.32 MIL/uL — ABNORMAL LOW (ref 3.87–5.11)
RBC: 3.05 MIL/uL — ABNORMAL LOW (ref 3.87–5.11)
RDW: 18.2 % — ABNORMAL HIGH (ref 11.5–15.5)
RDW: 18.5 % — ABNORMAL HIGH (ref 11.5–15.5)
WBC: 3.8 10*3/uL — ABNORMAL LOW (ref 4.0–10.5)
WBC: 3.3 10*3/uL — ABNORMAL LOW (ref 4.0–10.5)
nRBC: 0.8 % — ABNORMAL HIGH (ref 0.0–0.2)
nRBC: 0.9 % — ABNORMAL HIGH (ref 0.0–0.2)

## 2019-02-06 LAB — URINALYSIS, ROUTINE W REFLEX MICROSCOPIC
Bilirubin Urine: NEGATIVE
Glucose, UA: NEGATIVE mg/dL
Hgb urine dipstick: NEGATIVE
Ketones, ur: NEGATIVE mg/dL
Leukocytes,Ua: NEGATIVE
Nitrite: NEGATIVE
Protein, ur: NEGATIVE mg/dL
Specific Gravity, Urine: 1.015 (ref 1.005–1.030)
pH: 5 (ref 5.0–8.0)

## 2019-02-06 LAB — COMPREHENSIVE METABOLIC PANEL
ALT: 8 U/L (ref 0–44)
AST: 24 U/L (ref 15–41)
Albumin: 3.1 g/dL — ABNORMAL LOW (ref 3.5–5.0)
Alkaline Phosphatase: 107 U/L (ref 38–126)
Anion gap: 13 (ref 5–15)
BUN: 23 mg/dL — ABNORMAL HIGH (ref 6–20)
CO2: 18 mmol/L — ABNORMAL LOW (ref 22–32)
Calcium: 8.8 mg/dL — ABNORMAL LOW (ref 8.9–10.3)
Chloride: 95 mmol/L — ABNORMAL LOW (ref 98–111)
Creatinine, Ser: 1.36 mg/dL — ABNORMAL HIGH (ref 0.44–1.00)
GFR calc Af Amer: 50 mL/min — ABNORMAL LOW (ref >60)
GFR calc non Af Amer: 43 mL/min — ABNORMAL LOW (ref >60)
Glucose, Bld: 83 mg/dL (ref 70–99)
Potassium: 4.4 mmol/L (ref 3.5–5.1)
Sodium: 126 mmol/L — ABNORMAL LOW (ref 135–145)
Total Bilirubin: 0.2 mg/dL — ABNORMAL LOW (ref 0.3–1.2)
Total Protein: 6.4 g/dL — ABNORMAL LOW (ref 6.5–8.1)

## 2019-02-06 LAB — BASIC METABOLIC PANEL
Anion gap: 12 (ref 5–15)
BUN: 22 mg/dL — ABNORMAL HIGH (ref 6–20)
CO2: 17 mmol/L — ABNORMAL LOW (ref 22–32)
Calcium: 8.3 mg/dL — ABNORMAL LOW (ref 8.9–10.3)
Chloride: 98 mmol/L (ref 98–111)
Creatinine, Ser: 1.29 mg/dL — ABNORMAL HIGH (ref 0.44–1.00)
GFR calc Af Amer: 53 mL/min — ABNORMAL LOW (ref >60)
GFR calc non Af Amer: 46 mL/min — ABNORMAL LOW (ref >60)
Glucose, Bld: 79 mg/dL (ref 70–99)
Potassium: 4.6 mmol/L (ref 3.5–5.1)
Sodium: 127 mmol/L — ABNORMAL LOW (ref 135–145)

## 2019-02-06 LAB — TROPONIN I (HIGH SENSITIVITY): Troponin I (High Sensitivity): 2 ng/L (ref <18)

## 2019-02-06 LAB — ETHANOL: Alcohol, Ethyl (B): <10 mg/dL (ref <10)

## 2019-02-06 LAB — RAPID URINE DRUG SCREEN, HOSP PERFORMED
Amphetamines: NOT DETECTED
Barbiturates: NOT DETECTED
Benzodiazepines: NOT DETECTED
Cocaine: POSITIVE — AB
Opiates: NOT DETECTED
Tetrahydrocannabinol: POSITIVE — AB

## 2019-02-06 LAB — LIPASE, BLOOD: Lipase: 41 U/L (ref 11–51)

## 2019-02-06 LAB — OCCULT BLOOD X 1 CARD TO LAB, STOOL: Fecal Occult Bld: NEGATIVE

## 2019-02-06 MED ORDER — SODIUM CHLORIDE 0.9 % IV BOLUS
1000.0000 mL | Freq: Once | INTRAVENOUS | Status: AC
  Administered 2019-02-06: 1000 mL via INTRAVENOUS

## 2019-02-06 MED ORDER — SODIUM CHLORIDE 0.9 % IV SOLN
Freq: Once | INTRAVENOUS | Status: AC
  Administered 2019-02-06: 23:00:00 via INTRAVENOUS

## 2019-02-06 MED ORDER — PROCHLORPERAZINE EDISYLATE 10 MG/2ML IJ SOLN
10.0000 mg | Freq: Once | INTRAMUSCULAR | Status: AC
  Administered 2019-02-06: 10 mg via INTRAVENOUS
  Filled 2019-02-06: qty 2

## 2019-02-06 NOTE — ED Triage Notes (Signed)
Pt arrived via [facility]. Pt co HA, chest pain, dry cough since 10/29. Dizziness upon standing. Initial B/P was 80/p for EMS. EMS gave 500 mL of NaCl and 4mg  of ondansetron.

## 2019-02-06 NOTE — ED Notes (Signed)
Pt unable to urinate at this time.  
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
## 2019-02-07 ENCOUNTER — Encounter (HOSPITAL_COMMUNITY): Payer: Self-pay

## 2019-02-07 ENCOUNTER — Inpatient Hospital Stay (HOSPITAL_COMMUNITY): Payer: Medicaid Other

## 2019-02-07 DIAGNOSIS — F1490 Cocaine use, unspecified, uncomplicated: Secondary | ICD-10-CM

## 2019-02-07 DIAGNOSIS — E871 Hypo-osmolality and hyponatremia: Secondary | ICD-10-CM | POA: Diagnosis not present

## 2019-02-07 DIAGNOSIS — R918 Other nonspecific abnormal finding of lung field: Secondary | ICD-10-CM

## 2019-02-07 DIAGNOSIS — R7402 Elevation of levels of lactic acid dehydrogenase (LDH): Secondary | ICD-10-CM

## 2019-02-07 DIAGNOSIS — M7989 Other specified soft tissue disorders: Secondary | ICD-10-CM | POA: Diagnosis not present

## 2019-02-07 DIAGNOSIS — F1010 Alcohol abuse, uncomplicated: Secondary | ICD-10-CM | POA: Diagnosis present

## 2019-02-07 DIAGNOSIS — E872 Acidosis: Secondary | ICD-10-CM | POA: Diagnosis present

## 2019-02-07 DIAGNOSIS — E222 Syndrome of inappropriate secretion of antidiuretic hormone: Secondary | ICD-10-CM | POA: Diagnosis present

## 2019-02-07 DIAGNOSIS — F17210 Nicotine dependence, cigarettes, uncomplicated: Secondary | ICD-10-CM | POA: Diagnosis present

## 2019-02-07 DIAGNOSIS — F1910 Other psychoactive substance abuse, uncomplicated: Secondary | ICD-10-CM

## 2019-02-07 DIAGNOSIS — R519 Headache, unspecified: Secondary | ICD-10-CM | POA: Diagnosis not present

## 2019-02-07 DIAGNOSIS — R112 Nausea with vomiting, unspecified: Secondary | ICD-10-CM | POA: Diagnosis present

## 2019-02-07 DIAGNOSIS — D696 Thrombocytopenia, unspecified: Secondary | ICD-10-CM | POA: Diagnosis present

## 2019-02-07 DIAGNOSIS — F329 Major depressive disorder, single episode, unspecified: Secondary | ICD-10-CM | POA: Diagnosis present

## 2019-02-07 DIAGNOSIS — R439 Unspecified disturbances of smell and taste: Secondary | ICD-10-CM | POA: Diagnosis present

## 2019-02-07 DIAGNOSIS — C7951 Secondary malignant neoplasm of bone: Secondary | ICD-10-CM

## 2019-02-07 DIAGNOSIS — R079 Chest pain, unspecified: Secondary | ICD-10-CM

## 2019-02-07 DIAGNOSIS — B20 Human immunodeficiency virus [HIV] disease: Secondary | ICD-10-CM

## 2019-02-07 DIAGNOSIS — D500 Iron deficiency anemia secondary to blood loss (chronic): Secondary | ICD-10-CM | POA: Diagnosis present

## 2019-02-07 DIAGNOSIS — R627 Adult failure to thrive: Secondary | ICD-10-CM

## 2019-02-07 DIAGNOSIS — G510 Bell's palsy: Secondary | ICD-10-CM | POA: Diagnosis present

## 2019-02-07 DIAGNOSIS — Z90710 Acquired absence of both cervix and uterus: Secondary | ICD-10-CM | POA: Diagnosis not present

## 2019-02-07 DIAGNOSIS — R0602 Shortness of breath: Secondary | ICD-10-CM | POA: Diagnosis not present

## 2019-02-07 DIAGNOSIS — Z79899 Other long term (current) drug therapy: Secondary | ICD-10-CM

## 2019-02-07 DIAGNOSIS — Z79891 Long term (current) use of opiate analgesic: Secondary | ICD-10-CM | POA: Diagnosis not present

## 2019-02-07 DIAGNOSIS — J189 Pneumonia, unspecified organism: Secondary | ICD-10-CM | POA: Diagnosis present

## 2019-02-07 DIAGNOSIS — Z880 Allergy status to penicillin: Secondary | ICD-10-CM

## 2019-02-07 DIAGNOSIS — R110 Nausea: Secondary | ICD-10-CM

## 2019-02-07 DIAGNOSIS — I10 Essential (primary) hypertension: Secondary | ICD-10-CM | POA: Diagnosis present

## 2019-02-07 DIAGNOSIS — I959 Hypotension, unspecified: Secondary | ICD-10-CM

## 2019-02-07 DIAGNOSIS — E861 Hypovolemia: Secondary | ICD-10-CM | POA: Diagnosis present

## 2019-02-07 DIAGNOSIS — C07 Malignant neoplasm of parotid gland: Secondary | ICD-10-CM

## 2019-02-07 DIAGNOSIS — N179 Acute kidney failure, unspecified: Secondary | ICD-10-CM | POA: Diagnosis present

## 2019-02-07 DIAGNOSIS — Z20828 Contact with and (suspected) exposure to other viral communicable diseases: Secondary | ICD-10-CM | POA: Diagnosis present

## 2019-02-07 DIAGNOSIS — D72819 Decreased white blood cell count, unspecified: Secondary | ICD-10-CM

## 2019-02-07 DIAGNOSIS — Z881 Allergy status to other antibiotic agents status: Secondary | ICD-10-CM

## 2019-02-07 DIAGNOSIS — G8929 Other chronic pain: Secondary | ICD-10-CM | POA: Diagnosis present

## 2019-02-07 LAB — CBC
HCT: 20.8 % — ABNORMAL LOW (ref 36.0–46.0)
Hemoglobin: 6.7 g/dL — CL (ref 12.0–15.0)
MCH: 23.6 pg — ABNORMAL LOW (ref 26.0–34.0)
MCHC: 32.2 g/dL (ref 30.0–36.0)
MCV: 73.2 fL — ABNORMAL LOW (ref 80.0–100.0)
Platelets: 81 10*3/uL — ABNORMAL LOW (ref 150–400)
RBC: 2.84 MIL/uL — ABNORMAL LOW (ref 3.87–5.11)
RDW: 18.6 % — ABNORMAL HIGH (ref 11.5–15.5)
WBC: 2.4 10*3/uL — ABNORMAL LOW (ref 4.0–10.5)
nRBC: 1.6 % — ABNORMAL HIGH (ref 0.0–0.2)

## 2019-02-07 LAB — OSMOLALITY, URINE: Osmolality, Ur: 391 mOsm/kg (ref 300–900)

## 2019-02-07 LAB — COMPREHENSIVE METABOLIC PANEL
ALT: 10 U/L (ref 0–44)
AST: 20 U/L (ref 15–41)
Albumin: 2.8 g/dL — ABNORMAL LOW (ref 3.5–5.0)
Alkaline Phosphatase: 92 U/L (ref 38–126)
Anion gap: 9 (ref 5–15)
BUN: 19 mg/dL (ref 6–20)
CO2: 17 mmol/L — ABNORMAL LOW (ref 22–32)
Calcium: 8.1 mg/dL — ABNORMAL LOW (ref 8.9–10.3)
Chloride: 103 mmol/L (ref 98–111)
Creatinine, Ser: 0.88 mg/dL (ref 0.44–1.00)
GFR calc Af Amer: >60 mL/min (ref >60)
GFR calc non Af Amer: >60 mL/min (ref >60)
Glucose, Bld: 104 mg/dL — ABNORMAL HIGH (ref 70–99)
Potassium: 3.9 mmol/L (ref 3.5–5.1)
Sodium: 129 mmol/L — ABNORMAL LOW (ref 135–145)
Total Bilirubin: 0.1 mg/dL — ABNORMAL LOW (ref 0.3–1.2)
Total Protein: 5.6 g/dL — ABNORMAL LOW (ref 6.5–8.1)

## 2019-02-07 LAB — RESPIRATORY PANEL BY PCR

## 2019-02-07 LAB — SARS CORONAVIRUS 2 (TAT 6-24 HRS): SARS Coronavirus 2: NEGATIVE

## 2019-02-07 LAB — RETICULOCYTES
Immature Retic Fract: 12.8 % (ref 2.3–15.9)
RBC.: 2.81 MIL/uL — ABNORMAL LOW (ref 3.87–5.11)
Retic Count, Absolute: 28.9 10*3/uL (ref 19.0–186.0)
Retic Ct Pct: 1 % (ref 0.4–3.1)

## 2019-02-07 LAB — C-REACTIVE PROTEIN: CRP: 1.6 mg/dL — ABNORMAL HIGH (ref <1.0)

## 2019-02-07 LAB — SODIUM, URINE, RANDOM: Sodium, Ur: 102 mmol/L

## 2019-02-07 LAB — IRON AND TIBC
Iron: 138 ug/dL (ref 28–170)
Saturation Ratios: 75 % — ABNORMAL HIGH (ref 10.4–31.8)
TIBC: 185 ug/dL — ABNORMAL LOW (ref 250–450)
UIBC: 47 ug/dL

## 2019-02-07 LAB — D-DIMER, QUANTITATIVE: D-Dimer, Quant: 1.88 ug/mL-FEU — ABNORMAL HIGH (ref 0.00–0.50)

## 2019-02-07 LAB — FOLATE: Folate: 3.1 ng/mL — ABNORMAL LOW (ref >5.9)

## 2019-02-07 LAB — FERRITIN: Ferritin: 689 ng/mL — ABNORMAL HIGH (ref 11–307)

## 2019-02-07 LAB — VITAMIN B12: Vitamin B-12: 1239 pg/mL — ABNORMAL HIGH (ref 180–914)

## 2019-02-07 LAB — PREPARE RBC (CROSSMATCH)

## 2019-02-07 LAB — ABO/RH: ABO/RH(D): O POS

## 2019-02-07 LAB — LACTATE DEHYDROGENASE: LDH: 316 U/L — ABNORMAL HIGH (ref 98–192)

## 2019-02-07 LAB — T-HELPER CELLS (CD4) COUNT (NOT AT ARMC)
CD4 % Helper T Cell: 18 % — ABNORMAL LOW (ref 33–65)
CD4 T Cell Abs: 150 /uL — ABNORMAL LOW (ref 400–1790)

## 2019-02-07 MED ORDER — SODIUM CHLORIDE 0.9 % IV SOLN
500.0000 mg | INTRAVENOUS | Status: DC
  Administered 2019-02-07 – 2019-02-08 (×2): 500 mg via INTRAVENOUS
  Filled 2019-02-07 (×2): qty 500

## 2019-02-07 MED ORDER — SODIUM CHLORIDE 0.9% IV SOLUTION
Freq: Once | INTRAVENOUS | Status: AC
  Administered 2019-02-07: 17:00:00 via INTRAVENOUS

## 2019-02-07 MED ORDER — OXYCODONE HCL 5 MG PO TABS
10.0000 mg | ORAL_TABLET | Freq: Four times a day (QID) | ORAL | Status: DC | PRN
  Administered 2019-02-08: 10 mg via ORAL
  Filled 2019-02-07: qty 2

## 2019-02-07 MED ORDER — ONDANSETRON HCL 4 MG/2ML IJ SOLN
4.0000 mg | Freq: Four times a day (QID) | INTRAMUSCULAR | Status: DC | PRN
  Administered 2019-02-07 – 2019-02-09 (×2): 4 mg via INTRAVENOUS
  Filled 2019-02-07 (×3): qty 2

## 2019-02-07 MED ORDER — FOLIC ACID 1 MG PO TABS
1.0000 mg | ORAL_TABLET | Freq: Every day | ORAL | Status: DC
  Administered 2019-02-07 – 2019-02-11 (×5): 1 mg via ORAL
  Filled 2019-02-07 (×5): qty 1

## 2019-02-07 MED ORDER — METOPROLOL TARTRATE 25 MG PO TABS: 12.5000 mg | ORAL_TABLET | Freq: Two times a day (BID) | ORAL | Status: DC

## 2019-02-07 MED ORDER — PANTOPRAZOLE SODIUM 40 MG IV SOLR
40.0000 mg | Freq: Two times a day (BID) | INTRAVENOUS | Status: DC
  Administered 2019-02-07 – 2019-02-08 (×3): 40 mg via INTRAVENOUS
  Filled 2019-02-07 (×3): qty 40

## 2019-02-07 MED ORDER — METHADONE HCL 10 MG PO TABS
10.0000 mg | ORAL_TABLET | Freq: Two times a day (BID) | ORAL | Status: DC
  Administered 2019-02-07 – 2019-02-11 (×8): 10 mg via ORAL
  Filled 2019-02-07 (×8): qty 1

## 2019-02-07 MED ORDER — SODIUM CHLORIDE 0.9 % IV BOLUS
1000.0000 mL | Freq: Once | INTRAVENOUS | Status: AC
  Administered 2019-02-07: 1000 mL via INTRAVENOUS

## 2019-02-07 MED ORDER — OXYCODONE-ACETAMINOPHEN 5-325 MG PO TABS
1.0000 | ORAL_TABLET | Freq: Four times a day (QID) | ORAL | Status: DC | PRN
  Administered 2019-02-07: 2 via ORAL
  Filled 2019-02-07: qty 2

## 2019-02-07 MED ORDER — ENSURE ENLIVE PO LIQD: 237.0000 mL | Freq: Three times a day (TID) | ORAL | Status: DC

## 2019-02-07 MED ORDER — LIDOCAINE 5 % EX PTCH: 1.0000 | MEDICATED_PATCH | CUTANEOUS | Status: DC

## 2019-02-07 MED ORDER — SODIUM CHLORIDE 0.9 % IV SOLN
INTRAVENOUS | Status: DC
  Administered 2019-02-07 – 2019-02-08 (×4): via INTRAVENOUS

## 2019-02-07 MED ORDER — GABAPENTIN 300 MG PO CAPS: 300.0000 mg | ORAL_CAPSULE | Freq: Three times a day (TID) | ORAL | Status: DC

## 2019-02-07 MED ORDER — ACETAMINOPHEN 325 MG PO TABS
650.0000 mg | ORAL_TABLET | Freq: Four times a day (QID) | ORAL | Status: DC | PRN
  Administered 2019-02-07 – 2019-02-11 (×7): 650 mg via ORAL
  Filled 2019-02-07 (×8): qty 2

## 2019-02-07 MED ORDER — HEPARIN SODIUM (PORCINE) 5000 UNIT/ML IJ SOLN: 5000.0000 [IU] | Freq: Three times a day (TID) | INTRAMUSCULAR | Status: DC

## 2019-02-07 MED ORDER — FERROUS SULFATE 325 (65 FE) MG PO TABS: 325.0000 mg | ORAL_TABLET | Freq: Two times a day (BID) | ORAL | Status: DC

## 2019-02-07 MED ORDER — SODIUM CHLORIDE 0.9 % IV BOLUS
1000.0000 mL | Freq: Once | INTRAVENOUS | Status: AC
  Administered 2019-02-07: 11:00:00 1000 mL via INTRAVENOUS

## 2019-02-07 MED ORDER — ADULT MULTIVITAMIN W/MINERALS CH
1.0000 | ORAL_TABLET | Freq: Every day | ORAL | Status: DC
  Administered 2019-02-07 – 2019-02-10 (×4): 1 via ORAL
  Filled 2019-02-07 (×4): qty 1

## 2019-02-07 MED ORDER — ACETAMINOPHEN 650 MG RE SUPP: 650.0000 mg | Freq: Four times a day (QID) | RECTAL | Status: DC | PRN

## 2019-02-07 MED ORDER — CITALOPRAM HYDROBROMIDE 20 MG PO TABS
10.0000 mg | ORAL_TABLET | Freq: Every day | ORAL | Status: DC
  Administered 2019-02-07 – 2019-02-11 (×5): 10 mg via ORAL
  Filled 2019-02-07 (×5): qty 1

## 2019-02-07 MED ORDER — ONDANSETRON HCL 4 MG PO TABS
4.0000 mg | ORAL_TABLET | Freq: Four times a day (QID) | ORAL | Status: DC | PRN
  Administered 2019-02-10: 17:00:00 4 mg via ORAL
  Filled 2019-02-07: qty 1

## 2019-02-07 MED ORDER — BICTEGRAVIR-EMTRICITAB-TENOFOV 50-200-25 MG PO TABS
1.0000 | ORAL_TABLET | Freq: Every day | ORAL | Status: DC
  Administered 2019-02-07 – 2019-02-11 (×5): 1 via ORAL
  Filled 2019-02-07 (×5): qty 1

## 2019-02-07 NOTE — ED Notes (Signed)
Report attempted, unavailable.

## 2019-02-07 NOTE — ED Provider Notes (Signed)
She continues to be hypotensive with blood pressure in the [id number] systolic.  We will give additional IV fluid.  Blood pressure has come up to an adequate level with additional IV fluid.   Delora Fuel, MD [id number] [phone]

## 2019-02-07 NOTE — H&P (Addendum)
History and Physical  Kristen Roman [id number] DOB: Apr 28, 1960 DOA: 02/06/2019  PCP: [facility] Patient coming from: Home  I have personally briefly reviewed patient's old medical records in [place]   Chief Complaint: Headache, palpitation.  HPI: Kristen Roman is a 58 y.o. 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  She denies fever, cough, sore throat.  She does report loss of taste, shortness of breath and nausea and vomiting.  Patient is not a good historian, history is limited.  She continues to complain of nausea, asking for ice cream.  Evaluation in the ED: Sodium 127, CO2 17, BUN 22, creatinine 1.2, hemoglobin 7.1, white blood cell 3.3, platelets 81, UA negative, fecal occult blood negative alcohol level less than 10.  UDS positive for cocaine and marijuana. CT head:No CT evidence for acute intracranial abnormality/changed since prior CT 02/03/2019. Mild atrophy 3. Redemonstrated heterogenous/permeative changes of the skull base and mandible. Chest x-ray:Similar appearance of hazy ground-glass opacity in the right perihilar region and mid to lower lung, possibly representing pneumonia, including atypical or viral pneumonia.   Review of Systems: All systems reviewed and apart from history of presenting illness, are negative.  Past Medical History:  Diagnosis Date  . Cancer ([place])    "bone cancer"  . Hemorrhoid   . HIV disease ([place])   . Hypertension    Past Surgical History:  Procedure Laterality Date  . ABDOMINAL HYSTERECTOMY    . CESAREAN SECTION     Social History:  reports that she has been smoking cigarettes. She started smoking about 43 years ago. She has a 40.00 pack-year smoking history. She has never used  smokeless tobacco. She reports current alcohol use of about 100.0 standard drinks of alcohol per week. She reports current drug use. Frequency: 3.00 times per week. Drug: Marijuana.   Allergies  Allergen Reactions  . Amoxicillin Itching  . Penicillin G Itching    Did it involve swelling of the face/tongue/throat, SOB, or low BP? No Did it involve sudden or severe rash/hives, skin peeling, or any reaction on the inside of your mouth or nose? No Did you need to seek medical attention at a hospital or doctor's office? yes When did it last happen?recently If all above answers are "NO", may proceed with cephalosporin use.    Family History  Problem Relation Age of Onset  . Kidney disease Mother     Prior to Admission medications   Medication Sig Start Date End Date Taking? Authorizing Provider  acetaminophen (TYLENOL) 500 MG tablet Take 1,000 mg by mouth every 6 (six) hours as needed. Patient used this medication for pain.    [provider]  bictegravir-emtricitabine-tenofovir AF (BIKTARVY) 50-200-25 MG TABS tablet Take 1 tablet by mouth daily.     [provider]  citalopram (CELEXA) 10 MG tablet TAKE 1 TABLET BY MOUTH EVERY DAY Patient taking differently: Take 10 mg by mouth daily.  02/21/14   Carlyle Basques, MD  feeding supplement, ENSURE ENLIVE, (ENSURE ENLIVE) LIQD Take 237 mLs by mouth 2 (two) times daily between meals. Patient not taking: Reported on 12/11/2018 11/09/18   Swayze, Ava, DO  ferrous sulfate 325 (65 FE) MG tablet Take 1 tablet (325 mg total) by mouth 2 (two) times daily with a meal. 02/03/19   Palumbo,  April, MD  gabapentin (NEURONTIN) 300 MG capsule Take 1 capsule (300 mg total) by mouth 3 (three) times daily. 10/23/18   Lajean Saver, MD  lidocaine (LIDODERM) 5 % Place 1 patch onto the skin daily. Remove & Discard patch within 12 hours or as directed by MD 02/03/19   Randal Buba, April, MD  metoprolol tartrate (LOPRESSOR) 25 MG tablet Take 0.5 tablets  (12.5 mg total) by mouth 2 (two) times daily. 11/09/18   Swayze, Ava, DO  oxyCODONE-acetaminophen (PERCOCET/ROXICET) 5-325 MG tablet Take 1-2 tablets by mouth every 6 (six) hours as needed for severe pain. 12/11/18   Domenic Polite, MD   Physical Exam: Vitals:   02/07/19 0444 02/07/19 0621 02/07/19 0803 02/07/19 0858  BP: 98/73 90/65 92/65  96/67  Pulse: 80 74 66 71  Resp: 20 (!) 21 20 18   Temp:    97.7 F (36.5 C)  TempSrc:    Oral  SpO2: 96% 99% 99% 100%  Weight:      Height:         General exam: Chronic ill-appearing  Head, eyes and ENT: Nontraumatic and normocephalic.   Neck: Supple. No JVD, carotid bruit or thyromegaly.  Lymphatics: No lymphadenopathy.  Respiratory system: Clear to auscultation. No increased work of breathing.  Cardiovascular system: S1 and S2 heard, RRR. No JVD, murmurs, gallops, clicks or pedal edema.  Gastrointestinal system: Abdomen is nondistended, soft and nontender. Normal bowel sounds heard. No organomegaly or masses appreciated.  Central nervous system: Alert and oriented.  Bell's palsy, chronic weakness of left lower extremity  Extremities:  Peripheral pulses symmetrically felt.   Skin: No rashes or acute findings.  Musculoskeletal system: Negative exam.  Psychiatry: Pleasant.    Labs on Admission:  Basic Metabolic Panel: Recent Labs  Lab 02/02/19 2130 02/06/19 2028 02/06/19 2129  NA 127* 126* 127*  K 4.3 4.4 4.6  CL 93* 95* 98  CO2 20* 18* 17*  GLUCOSE 78 83 79  BUN 17 23* 22*  CREATININE 1.22* 1.36* 1.29*  CALCIUM 9.3 8.8* 8.3*   Liver Function Tests: Recent Labs  Lab 02/06/19 2028  AST 24  ALT 8  ALKPHOS 107  BILITOT 0.2*  PROT 6.4*  ALBUMIN 3.1*   Recent Labs  Lab 02/06/19 2028  LIPASE 41   No results for input(s): AMMONIA in the last 168 hours. CBC: Recent Labs  Lab 02/02/19 2130 02/06/19 2028 02/06/19 2129  WBC 4.4 3.8* 3.3*  NEUTROABS  --  1.4* 1.3*  HGB 8.5* 7.8* 7.1*  HCT 26.1* 23.4* 22.0*  MCV  72.1* 70.5* 72.1*  PLT 118* 93* 81*   Cardiac Enzymes: No results for input(s): CKTOTAL, CKMB, CKMBINDEX, TROPONINI in the last 168 hours.  BNP (last 3 results) No results for input(s): PROBNP in the last 8760 hours. CBG: No results for input(s): GLUCAP in the last 168 hours.  Radiological Exams on Admission: Dg Chest 2 View  Result Date: 02/06/2019 CLINICAL DATA:  Headache and chest pain EXAM: CHEST - 2 VIEW COMPARISON:  02/02/2019, 12/10/2018 FINDINGS: Patchy interstitial and vague airspace disease in the right perihilar region and mid lung. No pleural effusion. Normal heart size. No pneumothorax. IMPRESSION: Similar appearance of hazy ground-glass opacity in the right perihilar region and mid to lower lung, possibly representing pneumonia, including atypical or viral pneumonia. Electronically Signed   By: Donavan Foil M.D.   On: 02/06/2019 21:24   Ct Head Wo Contrast  Result Date: 02/06/2019 CLINICAL DATA:  Vertigo EXAM: CT HEAD WITHOUT CONTRAST TECHNIQUE: Contiguous  axial images were obtained from the base of the skull through the vertex without intravenous contrast. COMPARISON:  CT 02/03/2019, FINDINGS: Brain: No acute territorial infarction, hemorrhage or new intracranial mass. Previously described left middle cranial fossa mass is poorly visible without contrast. Mild atrophy. Stable ventricle size. Vascular: No hyperdense vessels. Scattered calcifications at the carotid siphon Skull: No fracture. Heterogenous and permeative changes of the skull base and mandible Sinuses/Orbits: No acute finding. Other: None IMPRESSION: 1. No CT evidence for acute intracranial abnormality/changed since prior CT 02/03/2019 2. Mild atrophy 3. Redemonstrated heterogenous/permeative changes of the skull base and mandible Electronically Signed   By: Donavan Foil M.D.   On: 02/06/2019 21:22    EKG: Independently reviewed.  Sinus tachycardia  Assessment/Plan Active Problems:   HIV INFECTION, PRIMARY    IRON DEFICIENCY ANEMIA SECONDARY TO BLOOD LOSS   HTN (hypertension)   Depression   AKI (acute kidney injury) ([place])   Hyponatremia   Primary adenocarcinoma of parotid gland ([facility])   Intractable nausea and vomiting   Nausea & vomiting   Atypical pneumonia  1-Nausea vomiting: Present with poor oral intake, nausea vomiting, loss of taste. Continue with IV fluids. Zofran as needed. Covid test pending.  Admit under PUI. Lipase level on admission normal, ethanol level negative. Patient currently denies abdominal pain, will hold on getting CT scan at this point. IV Protonix ordered.  2-Possible atypical pneumonia: Patient reports shortness of breath.  Loss of taste.  She denies cough. Chest x-ray with atypical infiltrates. Covid  test pending. We will start Azithromycin to cover for atypical infection.  3-Anemia: Prior history of iron deficiency anemia. Will check anemia panel. Transfuse if hemoglobin less than 7. Addendum; HB at 6.7. will proceed with one unit PRBC>   4-Hyponatremia And Metabolic acidosis: CO2 17, chloride 95. Check urine sodium, urine osmolarity. Continue with IV fluids.  If hyponatremia get worse on IV fluids will need to consider SIADH  5-HIV: Check CD4 count. Continue with Biktarvi.   6-AKI;  Presents with creatinine at 1.3.  Creatinine during the month of September was 0.7. Related to hypovolemia: Continue with IV fluids.  7-UDS positive for cocaine: Will need to provide counseling, when patient is less symptomatic with nausea.  8-Covid test pending: Admit patient under PUI, due to shortness of breath, abnormal chest x-ray, nausea vomiting and loss of taste. I wear for patient encounter  N95, face shield, gown and glove.  Chronic pain;  On methadone and oxy.   Addendum;  D dimer elevated; Will check doppler LE. Awaiting covid test.  CD 4 150, Low , concern for PCP Pneumonia. ID consulted.   DVT Prophylaxis: SCDs no anticoagulation due to  thrombocytopenia. Code Status: Full code Family Communication: Care discussed with patient.  Disposition Plan: Admit under observation for nausea vomiting, shortness of breath feeder evaluation of anemia, awaiting Covid test.  Will admit patient under PUI  Time spent: 75 minutes       Prathik Aman A Taneshia Lorence MD [facility]   02/07/2019, 10:21 AM

## 2019-02-07 NOTE — ED Notes (Signed)
Report attempted to floor, left call back number

## 2019-02-07 NOTE — Plan of Care (Signed)
Patient able to tolerate a small amount of liquids (ice cream, gingerale), c/o some nausea but no active vomiting reported this shift.

## 2019-02-07 NOTE — Progress Notes (Signed)
Patient at this time refused IV attempt, patient currently has 1 PIV that is working per [employer] and states if the patient changes her mind she will re insert the IV team consult

## 2019-02-07 NOTE — Progress Notes (Signed)
Bilateral lower extremity venous duplex has been completed. Preliminary results can be found in CV Proc through chart review.   02/07/19 2:42 PM Kristen Roman RVT

## 2019-02-07 NOTE — Progress Notes (Signed)
Initial Nutrition Assessment  RD working remotely.  DOCUMENTATION CODES:   Not applicable  INTERVENTION:   - Ensure Enlive po TID, each supplement provides 350 kcal and 20 grams of protein  - MVI with minerals daily  - Encourage adequate PO intake  NUTRITION DIAGNOSIS:   Inadequate oral intake related to poor appetite, nausea as evidenced by per patient/family report.  GOAL:   Patient will meet greater than or equal to 90% of their needs  MONITOR:   PO intake, Weight trends, Supplement acceptance, Diet advancement, Labs  REASON FOR ASSESSMENT:   Malnutrition Screening Tool    ASSESSMENT:   58 year old female who presented to the ED on 11/03 with nausea, headache. 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   Spoke with pt briefly via phone call to room. When RD introduced self, pt states "I don't want nothing." RD asked about pt's appetite. Pt reports she does not have much of an appetite and that this has been the case "for a while." Pt then states again "I don't want nothing" and proceeds to hang up the phone. RD will attempt to obtain more details regarding diet and weight history upon follow-up.  Reviewed weight history in chart. Pt with a 9.3 kg weight loss over the last 3 months. This is a 13.4% weight loss which is significant for timeframe. Suspect pt with some degree of malnutrition but unable to confirm without detailed diet history or NFPE.  Given pt reports poor appetite, RD will order oral nutrition supplements to aid pt in meeting kcal and protein needs. Will also order a daily MVI.  Medications reviewed and include: folic acid, Protonix, IV abx IVF: NS @ 100 ml/hr  Labs reviewed: sodium 129, hemoglobin 6.7  NUTRITION - FOCUSED PHYSICAL EXAM:  Unable to complete at this time. RD working remotely.  Diet Order:   Diet Order            Diet full liquid  Room service appropriate? Yes; Fluid consistency: Thin  Diet effective now              EDUCATION NEEDS:   No education needs have been identified at this time  Skin:  Skin Assessment: Reviewed RN Assessment  Last BM:  no documented BM  Height:   Ht Readings from Last 1 Encounters:  02/06/19 5\' 5"  (1.651 m)    Weight:   Wt Readings from Last 1 Encounters:  02/06/19 60 kg    Ideal Body Weight:  56.8 kg  BMI:  Body mass index is 22.01 kg/m.  Estimated Nutritional Needs:   Kcal:  1700-1900  Protein:  75-90 grams  Fluid:  >/= 1.7 L    Gaynell Face, MS, RD, LDN Inpatient Clinical Dietitian Pager: [phone] Weekend/After Hours: [phone]

## 2019-02-07 NOTE — Consult Note (Signed)
Date of Admission:  02/06/2019          Reason for Consult: Atypical infiltrates on chest x-ray and patient with HIV disease who is undergoing radiation therapy for parotid gland malignancy     Referring Provider: Dr. Tyrell Antonio   Assessment:  1. Chest pain and subtle nfiltrates on CXR  2. Headache 3. Nausea and vomiting 4. Hypotension 5. Leukopenia 6. HIV that has been well controlled based on labs at [facility]  7. Parotid gland adenocarcinoma with metastases on radiation therapy 8. Loss of smell that is more subacute than acute 9. Polysubstance abuuse 10. Failure to thrive 11. Elevated LDH  Plan:  1. Check respiratory virus panel 2. OK to continue azithromycin 3. Check Cryptococcal ag and RPR and if HA worsen consider LP 4. Check HIV quant RNA 5. I will DC her airborne precautions, and she can be on droplets pending resp virus panel 6. Continue BIktarvy 7. IVF 8. Consider palliative care consult   Active Problems:   HIV INFECTION, PRIMARY   IRON DEFICIENCY ANEMIA SECONDARY TO BLOOD LOSS   HTN (hypertension)   Depression   AKI (acute kidney injury) ([place])   Hyponatremia   Primary adenocarcinoma of parotid gland (HCC)   Intractable nausea and vomiting   Nausea & vomiting   Atypical pneumonia   Scheduled Meds: . sodium chloride   Intravenous Once  . bictegravir-emtricitabine-tenofovir AF  1 tablet Oral Daily  . citalopram  10 mg Oral Daily  . feeding supplement (ENSURE ENLIVE)  237 mL Oral TID BM  . folic acid  1 mg Oral Daily  . methadone  10 mg Oral BID  . multivitamin with minerals  1 tablet Oral Daily  . pantoprazole (PROTONIX) IV  40 mg Intravenous Q12H   Continuous Infusions: . sodium chloride 100 mL/hr at 02/07/19 1201  . azithromycin 500 mg (02/07/19 1203)   PRN Meds:.acetaminophen **OR** acetaminophen, ondansetron **OR** ondansetron (ZOFRAN) IV, oxyCODONE  HPI: Kristen Roman is a 58 y.o. female with past medical history significant for HIV  that is typically been well controlled on Biktarvy.  She previously followed here in [facility] clinic [facility] but transferred over to [facility].  While on antiretroviral therapy her CD4 counts have been typically high at [facility] with a CD4 count well above 400 and viral load was undetectable in June 2020.  She did develop unfortunately an adenocarcinoma of the parotid gland with metastases to the bone was diagnosed this June.  She has been on radiation therapy for this.  She does also suffer from comorbid alcohol abuse and polysubstance abuse with cocaine use.  She has been admitted to the hospital here at [facility] several times in the last few months with admission for acute renal failure and hypotension with recurrent falls in August 2020, admission in September with chest pain and work-up for pulmonary embolism that was negative.   She was seen October 31 with worsening chest pain shortness of breath on October 31.  She had a CT angiogram at that time which showed no evidence of pulmonary embolism and no evidence of parenchymal disease at the time but did show evidence of her metastatic parotid gland tumor.  She returned to the hospital on the third with nausea vomiting and headache as well as chest pain.  Her labs were significant for hyponatremia anemia positive tox screen for urine cocaine and marijuana.  In the ER she was hypotensive  responded to fluids.  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  Chest x-ray showed what was read as a hazy opacity right base.  She has been initiated on azithromycin.  SARS COVID-19 testing has again been performed and is negative.  She denies fevers or productive cough.  Her CD4 count was checked here and was at 150 but the percent of 18% was similar to her numbers when she had higher CD4 counts.  She does also have leukopenia on  labs.    I am skeptical that she has significant pulmonary pathology but I am ok with azithromycin.  I do not think she has PCP  I do not think she has COVID the more I think about her story including fact that loss of smell was over several weeks   I will check CXR tomorrow and recommend continued observation.  I will check HIV QUant and Cryptococcal ag, RPR.  I would consider palliative care consult given evidence of continued decline likely in part due to her malignancy  Review of Systems: Review of Systems  Constitutional: Positive for diaphoresis, malaise/fatigue and weight loss. Negative for chills and fever.  HENT: Negative for congestion, hearing loss, sore throat and tinnitus.   Eyes: Negative for blurred vision and double vision.  Respiratory: Positive for shortness of breath. Negative for cough, sputum production and wheezing.   Cardiovascular: Positive for chest pain and palpitations. Negative for leg swelling.  Gastrointestinal: Positive for abdominal pain, nausea and vomiting. Negative for blood in stool, constipation, diarrhea, heartburn and melena.  Genitourinary: Negative for dysuria, flank pain and hematuria.  Musculoskeletal: Negative for back pain, falls, joint pain and myalgias.  Skin: Negative for itching and rash.  Neurological: Positive for sensory change. Negative for dizziness, focal weakness, loss of consciousness, weakness and headaches.  Endo/Heme/Allergies: Does not bruise/bleed easily.  Psychiatric/Behavioral: Positive for substance abuse. Negative for depression, memory loss and suicidal ideas. The patient is not nervous/anxious.     Past Medical History:  Diagnosis Date  . Cancer ([place])    "bone cancer"  . Hemorrhoid   . HIV disease ([place])   . Hypertension     Social History   Tobacco Use  . Smoking status: Current Every Day Smoker    Packs/day: 1.00    Years: 40.00    Pack years: 40.00    Types: Cigarettes    Start date: 04/06/1975  .  Smokeless tobacco: Never Used  . Tobacco comment: pt. not ready to quit  Substance Use Topics  . Alcohol use: Yes    Alcohol/week: 100.0 standard drinks    Types: 100 Standard drinks or equivalent per week    Comment: pt. states she is an alcoholic - 6-pack a day if she has the money  . Drug use: Yes    Frequency: 3.0 times per week    Types: Marijuana    Family History  Problem Relation Age of Onset  . Kidney disease Mother    Allergies  Allergen Reactions  . Amoxicillin Itching  . Penicillin G Itching    Did it involve swelling of the face/tongue/throat, SOB, or low BP? No Did it involve sudden or severe rash/hives, skin peeling, or any reaction on the inside of your mouth or nose? No Did you need to seek medical attention at a hospital or doctor's office? yes When did it last happen?recently If all above answers are "NO", may  proceed with cephalosporin use.    OBJECTIVE: Blood pressure 90/62, pulse 61, temperature 98.3 F (36.8 C), temperature source Oral, resp. rate 18, height 5\' 5"  (1.651 m), weight 60 kg, SpO2 95 %.  Physical Exam Constitutional:      General: She is not in acute distress.    Appearance: She is ill-appearing.  HENT:     Head: Normocephalic.     Nose: Nose normal.  Eyes:     Extraocular Movements: Extraocular movements intact.  Neck:   Cardiovascular:     Rate and Rhythm: Normal rate.     Heart sounds: No murmur. No friction rub. No gallop.   Pulmonary:     Effort: Pulmonary effort is normal. No respiratory distress.     Breath sounds: Normal breath sounds. No stridor. No wheezing or rhonchi.  Abdominal:     General: Abdomen is flat. There is no distension.     Palpations: There is no mass.     Tenderness: There is no abdominal tenderness.     Hernia: No hernia is present.     Lab Results Lab Results  Component Value Date   WBC 2.4 (L) 02/07/2019   HGB 6.7 (LL) 02/07/2019   HCT 20.8 (L) 02/07/2019   MCV 73.2 (L) 02/07/2019    PLT 81 (L) 02/07/2019    Lab Results  Component Value Date   CREATININE 0.88 02/07/2019   BUN 19 02/07/2019   NA 129 (L) 02/07/2019   K 3.9 02/07/2019   CL 103 02/07/2019   CO2 17 (L) 02/07/2019    Lab Results  Component Value Date   ALT 10 02/07/2019   AST 20 02/07/2019   ALKPHOS 92 02/07/2019   BILITOT 0.1 (L) 02/07/2019     Microbiology: Recent Results (from the past 240 hour(s))  SARS CORONAVIRUS 2 (TAT 6-24 HRS) Nasopharyngeal Nasopharyngeal Swab     Status: None   Collection Time: 02/06/19 10:28 PM   Specimen: Nasopharyngeal Swab  Result Value Ref Range Status   SARS Coronavirus 2 NEGATIVE NEGATIVE Final    Comment: (NOTE) SARS-CoV-2 target nucleic acids are NOT DETECTED. The SARS-CoV-2 RNA is generally detectable in upper and lower respiratory specimens during the acute phase of infection. Negative results do not preclude SARS-CoV-2 infection, do not rule out co-infections with other pathogens, and should not be used as the sole basis for treatment or other patient management decisions. Negative results must be combined with clinical observations, patient history, and epidemiological information. The expected result is Negative. Fact Sheet for Patients: SugarRoll.be Fact Sheet for Healthcare Providers: https://www.woods-mathews.com/ This test is not yet approved or cleared by the [place] FDA and  has been authorized for detection and/or diagnosis of SARS-CoV-2 by FDA under an Emergency Use Authorization (EUA). This EUA will remain  in effect (meaning this test can be used) for the duration of the COVID-19 declaration under Section 56 4(b)(1) of the Act, 21 U.S.C. section 360bbb-3(b)(1), unless the authorization is terminated or revoked sooner. Performed at [facility], [place] [address] [place], [place] [address]     Alcide Evener, [place] [facility] [facility]  [phone] pager  02/07/2019, 5:14 PM

## 2019-02-08 ENCOUNTER — Inpatient Hospital Stay (HOSPITAL_COMMUNITY): Payer: Medicaid Other

## 2019-02-08 DIAGNOSIS — R0602 Shortness of breath: Secondary | ICD-10-CM

## 2019-02-08 DIAGNOSIS — I10 Essential (primary) hypertension: Secondary | ICD-10-CM

## 2019-02-08 LAB — TYPE AND SCREEN
ABO/RH(D): O POS
Antibody Screen: NEGATIVE
Unit division: 0

## 2019-02-08 LAB — COMPREHENSIVE METABOLIC PANEL
ALT: 8 U/L (ref 0–44)
AST: 20 U/L (ref 15–41)
Albumin: 2.5 g/dL — ABNORMAL LOW (ref 3.5–5.0)
Alkaline Phosphatase: 85 U/L (ref 38–126)
Anion gap: 10 (ref 5–15)
BUN: 11 mg/dL (ref 6–20)
CO2: 18 mmol/L — ABNORMAL LOW (ref 22–32)
Calcium: 8.4 mg/dL — ABNORMAL LOW (ref 8.9–10.3)
Chloride: 105 mmol/L (ref 98–111)
Creatinine, Ser: 0.7 mg/dL (ref 0.44–1.00)
GFR calc Af Amer: >60 mL/min (ref >60)
GFR calc non Af Amer: >60 mL/min (ref >60)
Glucose, Bld: 68 mg/dL — ABNORMAL LOW (ref 70–99)
Potassium: 4.1 mmol/L (ref 3.5–5.1)
Sodium: 133 mmol/L — ABNORMAL LOW (ref 135–145)
Total Bilirubin: 0.6 mg/dL (ref 0.3–1.2)
Total Protein: 5.1 g/dL — ABNORMAL LOW (ref 6.5–8.1)

## 2019-02-08 LAB — CBC
HCT: 25.7 % — ABNORMAL LOW (ref 36.0–46.0)
Hemoglobin: 8.2 g/dL — ABNORMAL LOW (ref 12.0–15.0)
MCH: 24.3 pg — ABNORMAL LOW (ref 26.0–34.0)
MCHC: 31.9 g/dL (ref 30.0–36.0)
MCV: 76.3 fL — ABNORMAL LOW (ref 80.0–100.0)
Platelets: 71 10*3/uL — ABNORMAL LOW (ref 150–400)
RBC: 3.37 MIL/uL — ABNORMAL LOW (ref 3.87–5.11)
RDW: 19.9 % — ABNORMAL HIGH (ref 11.5–15.5)
WBC: 2.4 10*3/uL — ABNORMAL LOW (ref 4.0–10.5)
nRBC: 2.1 % — ABNORMAL HIGH (ref 0.0–0.2)

## 2019-02-08 LAB — BPAM RBC
Blood Product Expiration Date: 202012062359
ISSUE DATE / TIME: 202011041653
Unit Type and Rh: 5100

## 2019-02-08 LAB — CRYPTOCOCCAL ANTIGEN: Crypto Ag: NEGATIVE

## 2019-02-08 MED ORDER — SODIUM CHLORIDE 0.9 % IV BOLUS
1000.0000 mL | Freq: Once | INTRAVENOUS | Status: AC
  Administered 2019-02-08: 08:00:00 1000 mL via INTRAVENOUS

## 2019-02-08 MED ORDER — AZITHROMYCIN 250 MG PO TABS
500.0000 mg | ORAL_TABLET | Freq: Every day | ORAL | Status: DC
  Administered 2019-02-09 – 2019-02-11 (×3): 500 mg via ORAL
  Filled 2019-02-08 (×3): qty 2

## 2019-02-08 MED ORDER — PANTOPRAZOLE SODIUM 40 MG PO TBEC
40.0000 mg | DELAYED_RELEASE_TABLET | Freq: Two times a day (BID) | ORAL | Status: DC
  Administered 2019-02-08 – 2019-02-11 (×6): 40 mg via ORAL
  Filled 2019-02-08 (×6): qty 1

## 2019-02-08 MED ORDER — DIPHENHYDRAMINE HCL 25 MG PO CAPS
25.0000 mg | ORAL_CAPSULE | Freq: Once | ORAL | Status: AC
  Administered 2019-02-08: 02:00:00 25 mg via ORAL
  Filled 2019-02-08: qty 1

## 2019-02-08 NOTE — Progress Notes (Signed)
Subjective:  Patient feels much better but is very cold   Antibiotics:  Anti-infectives (From admission, onward)   Start     Dose/Rate Route Frequency Ordered Stop   02/07/19 1300  bictegravir-emtricitabine-tenofovir AF (BIKTARVY) 50-200-25 MG per tablet 1 tablet     1 tablet Oral Daily 02/07/19 0917     02/07/19 1000  azithromycin (ZITHROMAX) 500 mg in sodium chloride 0.9 % 250 mL IVPB     500 mg 250 mL/hr over 60 Minutes Intravenous Every 24 hours 02/07/19 0918        Medications: Scheduled Meds: . bictegravir-emtricitabine-tenofovir AF  1 tablet Oral Daily  . citalopram  10 mg Oral Daily  . feeding supplement (ENSURE ENLIVE)  237 mL Oral TID BM  . folic acid  1 mg Oral Daily  . methadone  10 mg Oral BID  . multivitamin with minerals  1 tablet Oral Daily  . pantoprazole (PROTONIX) IV  40 mg Intravenous Q12H   Continuous Infusions: . sodium chloride 100 mL/hr at 02/08/19 0559  . azithromycin Stopped (02/07/19 1445)   PRN Meds:.acetaminophen **OR** acetaminophen, ondansetron **OR** ondansetron (ZOFRAN) IV, oxyCODONE    Objective: Weight change:   Intake/Output Summary (Last 24 hours) at 02/08/2019 0928 Last data filed at 02/08/2019 0600 Gross per 24 hour  Intake 2539.97 ml  Output 200 ml  Net 2339.97 ml   Blood pressure (!) 87/62, pulse 62, temperature 98.3 F (36.8 C), temperature source Oral, resp. rate 18, height 5\' 5"  (1.651 m), weight 60 kg, SpO2 100 %. Temp:  [97.7 F (36.5 C)-98.5 F (36.9 C)] 98.3 F (36.8 C) (11/05 0543) Pulse Rate:  [58-62] 62 (11/05 0928) Resp:  [18-20] 18 (11/05 0543) BP: (70-96)/(50-63) 87/62 (11/05 0928) SpO2:  [95 %-100 %] 100 % (11/05 0928)  Physical Exam: General: Alert and awake, oriented x3, not in any acute distress. HEENT: anicteric sclera, EOMI, has chronic facial asymmetry CVS regular rate, normal n o mgr Chest: , no wheezing, no respiratory distress and lungs clear she has deformity from her sternal  fracture Abdomen: soft non-distended,  Skin: no rashes Neuro: nonfocal  CBC:    BMET Recent Labs    02/07/19 1113 02/08/19 0438  NA 129* 133*  K 3.9 4.1  CL 103 105  CO2 17* 18*  GLUCOSE 104* 68*  BUN 19 11  CREATININE 0.88 0.70  CALCIUM 8.1* 8.4*     Liver Panel  Recent Labs    02/07/19 1113 02/08/19 0438  PROT 5.6* 5.1*  ALBUMIN 2.8* 2.5*  AST 20 20  ALT 10 8  ALKPHOS 92 85  BILITOT 0.1* 0.6       Sedimentation Rate No results for input(s): ESRSEDRATE in the last 72 hours. C-Reactive Protein Recent Labs    02/07/19 1113  CRP 1.6*    Micro Results: Recent Results (from the past 720 hour(s))  SARS CORONAVIRUS 2 (TAT 6-24 HRS) Nasopharyngeal Nasopharyngeal Swab     Status: None   Collection Time: 02/06/19 10:28 PM   Specimen: Nasopharyngeal Swab  Result Value Ref Range Status   SARS Coronavirus 2 NEGATIVE NEGATIVE Final    Comment: (NOTE) SARS-CoV-2 target nucleic acids are NOT DETECTED. The SARS-CoV-2 RNA is generally detectable in upper and lower respiratory specimens during the acute phase of infection. Negative results do not preclude SARS-CoV-2 infection, do not rule out co-infections with other pathogens, and should not be used as the sole basis for treatment or other patient management decisions. Negative  results must be combined with clinical observations, patient history, and epidemiological information. The expected result is Negative. Fact Sheet for Patients: SugarRoll.be Fact Sheet for Healthcare Providers: https://www.woods-mathews.com/ This test is not yet approved or cleared by the [place] FDA and  has been authorized for detection and/or diagnosis of SARS-CoV-2 by FDA under an Emergency Use Authorization (EUA). This EUA will remain  in effect (meaning this test can be used) for the duration of the COVID-19 declaration under Section 56 4(b)(1) of the Act, 21 U.S.C. section  360bbb-3(b)(1), unless the authorization is terminated or revoked sooner. Performed at [facility], [place] [address] [place], [place] [address]   Respiratory Panel by PCR     Status: None   Collection Time: 02/07/19  4:48 PM   Specimen: Nasopharyngeal Swab; Respiratory  Result Value Ref Range Status   Adenovirus NOT DETECTED NOT DETECTED Final   Coronavirus 229E NOT DETECTED NOT DETECTED Final    Comment: (NOTE) The Coronavirus on the Respiratory Panel, DOES NOT test for the novel  Coronavirus (2019 nCoV)    Coronavirus HKU1 NOT DETECTED NOT DETECTED Final   Coronavirus NL63 NOT DETECTED NOT DETECTED Final   Coronavirus OC43 NOT DETECTED NOT DETECTED Final   Metapneumovirus NOT DETECTED NOT DETECTED Final   Rhinovirus / Enterovirus NOT DETECTED NOT DETECTED Final   Influenza A NOT DETECTED NOT DETECTED Final   Influenza B NOT DETECTED NOT DETECTED Final   Parainfluenza Virus 1 NOT DETECTED NOT DETECTED Final   Parainfluenza Virus 2 NOT DETECTED NOT DETECTED Final   Parainfluenza Virus 3 NOT DETECTED NOT DETECTED Final   Parainfluenza Virus 4 NOT DETECTED NOT DETECTED Final   Respiratory Syncytial Virus NOT DETECTED NOT DETECTED Final   Bordetella pertussis NOT DETECTED NOT DETECTED Final   Chlamydophila pneumoniae NOT DETECTED NOT DETECTED Final   Mycoplasma pneumoniae NOT DETECTED NOT DETECTED Final    Comment: Performed at [facility], [place]. [address] [place], [place] [address]    Studies/Results: Dg Chest 2 View  Result Date: 02/06/2019 CLINICAL DATA:  Headache and chest pain EXAM: CHEST - 2 VIEW COMPARISON:  02/02/2019, 12/10/2018 FINDINGS: Patchy interstitial and vague airspace disease in the right perihilar region and mid lung. No pleural effusion. Normal heart size. No pneumothorax. IMPRESSION: Similar appearance of hazy ground-glass opacity in the right perihilar region and mid to lower lung, possibly representing pneumonia, including atypical or viral  pneumonia. Electronically Signed   By: Donavan Foil M.D.   On: 02/06/2019 21:24   Ct Head Wo Contrast  Result Date: 02/06/2019 CLINICAL DATA:  Vertigo EXAM: CT HEAD WITHOUT CONTRAST TECHNIQUE: Contiguous axial images were obtained from the base of the skull through the vertex without intravenous contrast. COMPARISON:  CT 02/03/2019, FINDINGS: Brain: No acute territorial infarction, hemorrhage or new intracranial mass. Previously described left middle cranial fossa mass is poorly visible without contrast. Mild atrophy. Stable ventricle size. Vascular: No hyperdense vessels. Scattered calcifications at the carotid siphon Skull: No fracture. Heterogenous and permeative changes of the skull base and mandible Sinuses/Orbits: No acute finding. Other: None IMPRESSION: 1. No CT evidence for acute intracranial abnormality/changed since prior CT 02/03/2019 2. Mild atrophy 3. Redemonstrated heterogenous/permeative changes of the skull base and mandible Electronically Signed   By: Donavan Foil M.D.   On: 02/06/2019 21:22   Vas [place] Lower Extremity Venous (dvt)  Result Date: 02/07/2019  Lower Venous Study Indications: Swelling.  Risk Factors: None identified. Comparison Study: No prior studies. Performing Technologist:  Oliver Hum RVT  Examination Guidelines: A complete evaluation includes B-mode imaging, spectral Doppler, color Doppler, and power Doppler as needed of all accessible portions of each vessel. Bilateral testing is considered an integral part of a complete examination. Limited examinations for reoccurring indications may be performed as noted.  +---------+---------------+---------+-----------+----------+--------------+ RIGHT    CompressibilityPhasicitySpontaneityPropertiesThrombus Aging +---------+---------------+---------+-----------+----------+--------------+ CFV      Full           Yes      Yes                                  +---------+---------------+---------+-----------+----------+--------------+ SFJ      Full                                                        +---------+---------------+---------+-----------+----------+--------------+ FV Prox  Full                                                        +---------+---------------+---------+-----------+----------+--------------+ FV Mid   Full                                                        +---------+---------------+---------+-----------+----------+--------------+ FV DistalFull                                                        +---------+---------------+---------+-----------+----------+--------------+ PFV      Full                                                        +---------+---------------+---------+-----------+----------+--------------+ POP      Full           Yes      Yes                                 +---------+---------------+---------+-----------+----------+--------------+ PTV      Full                                                        +---------+---------------+---------+-----------+----------+--------------+ PERO     Full                                                        +---------+---------------+---------+-----------+----------+--------------+   +---------+---------------+---------+-----------+----------+--------------+  LEFT     CompressibilityPhasicitySpontaneityPropertiesThrombus Aging +---------+---------------+---------+-----------+----------+--------------+ CFV      Full           Yes      Yes                                 +---------+---------------+---------+-----------+----------+--------------+ SFJ      Full                                                        +---------+---------------+---------+-----------+----------+--------------+ FV Prox  Full                                                         +---------+---------------+---------+-----------+----------+--------------+ FV Mid   Full                                                        +---------+---------------+---------+-----------+----------+--------------+ FV DistalFull                                                        +---------+---------------+---------+-----------+----------+--------------+ PFV      Full                                                        +---------+---------------+---------+-----------+----------+--------------+ POP      Full           Yes      Yes                                 +---------+---------------+---------+-----------+----------+--------------+ PTV      Full                                                        +---------+---------------+---------+-----------+----------+--------------+ PERO     Full                                                        +---------+---------------+---------+-----------+----------+--------------+     Summary: Right: There is no evidence of deep vein thrombosis in the lower extremity. No cystic structure found in the popliteal fossa. Left: There is no evidence of deep vein thrombosis in the lower extremity. No cystic structure found in the popliteal fossa.  *  See table(s) above for measurements and observations. Electronically signed by Curt Jews MD on 02/07/2019 at 6:23:15 PM.    Final       Assessment/Plan:  INTERVAL HISTORY: resp panel negative   Active Problems:   HIV INFECTION, PRIMARY   IRON DEFICIENCY ANEMIA SECONDARY TO BLOOD LOSS   HTN (hypertension)   Depression   AKI (acute kidney injury) ([place])   Hyponatremia   Primary adenocarcinoma of parotid gland (HCC)   Intractable nausea and vomiting   Nausea & vomiting   Atypical pneumonia    ADESOLA DUPREY is a 58 y.o. 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 THere was ? Of PNA  #1 Possibly PNA: I am skeptical that she has significant PNA with parenchyma clear on CTA on 1031. Certainly would be shocking for PCP to show up so quickly  She had loss of smell but over several months  COVID test negative Resp virus negative  OK to keep going with azithromycin just in case she has atypical bacterial infection  #2 Headaches: Likely in part due to mets. I doubt crypto though ag sent w [facility] as well  #3 HIV; HIV quant sent. 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   LOS: 1 day   Alcide Evener 02/08/2019, 9:28 AM

## 2019-02-08 NOTE — Progress Notes (Signed)
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  Patient was admitted with headache and palpitations.  No associated fever or chills.  No cough or other URI symptoms reported.  However, patient endorsed loss of taste, shortness of breath, nausea vomiting.  CD4 count on 02/07/2019 was 150.  COVID-19 came back negative.  Respiratory panel by PCR came back negative.  Cryptococcal antigen came back negative.  Urine toxicology was positive for cocaine and tetrahydrocannabinol.  Chest x-ray revealed hypoventilation with bibasilar atelectasis.  Vascular ultrasound of the lower extremities came back negative.  CT head without contrast did not show any acute abnormalities.  Infectious disease input is highly appreciated.  Assessment & Plan:   Active Problems:   HIV INFECTION, PRIMARY   IRON DEFICIENCY ANEMIA SECONDARY TO BLOOD LOSS   HTN (hypertension)   Depression   AKI (acute kidney injury) ([place])   Hyponatremia   Primary adenocarcinoma of parotid gland (H[facility])   Intractable nausea and vomiting   Nausea & vomiting   Atypical pneumonia  Nausea vomiting:  -Continue supportive management.   -Adequate hydration.   -Continue Zofran as needed.   -COVID-19 came back negative. -Normal lipase level. -Further management depend on hospital course.  Abnormal chest x-ray:  -Concerns for possible atypical pneumonia -Patient is on azithromycin. -Repeat chest x-ray today revealed hypoventilation and bibasilar atelectasis.   -Continue to assess. -Infectious disease and PT is appreciated. -Respiratory viral panel came back negative. -COVID-19 test came back negative.  Anemia:   -Iron panel is suggestive of anemia of chronic inflammation. -Significantly elevated ferritin (689) -Hemoglobin was 6.7 g/dL yesterday, transfused with 1 unit of packed red blood cells, hemoglobin today is 8.2 g/dL today. -Continue to monitor clinically.    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   -Discontinue IV fluids. -Repeat urine sodium, serum and urine osmolality.  HIV: Check CD4 count is 150 Infectious disease is following patient.   Continue with Biktarvi.  Will defer management to infectious disease team.  AKI;  Resolved.  Likely prerenal. Serum creatinine today 0.7.    UDS positive for cocaine:  Counseled.    Chronic pain;  On methadone  Discontinued opiates.   DVT prophylaxis: SCD Code Status: Full code Family Communication:  Disposition Plan: Home eventually   Consultants:   Infectious disease  Procedures:   None  Antimicrobials:   Oral azithromycin   Subjective: No new complaints.  Objective: Vitals:   02/08/19 0800 02/08/19 0928 02/08/19 1314 02/08/19 1546  BP: (!) 70/50 (!) 87/62 (!) 92/59 97/72  Pulse:  62 (!) 57 60  Resp:   19   Temp:   98.7 F (37.1 C)   TempSrc:   Oral   SpO2:  100% 99%   Weight:      Height:        Intake/Output Summary (Last 24 hours) at 02/08/2019 1810 Last data filed at 02/08/2019 1500 Gross per 24 hour  Intake 2874.61 ml  Output -  Net 2874.61 ml   Filed Weights   02/06/19 1959  Weight: 60 kg    Examination:  General exam: Appears calm and comfortable.  Right-sided facial trauma. Respiratory system: Clear to auscultation.  Cardiovascular system: S1 &  S2 heard Gastrointestinal system: Abdomen is nondistended, soft and nontender. No organomegaly or masses felt. Normal bowel sounds heard. Central nervous system: Alert and oriented.  Patient moves all extremities.    Data Reviewed: I have personally reviewed following labs and imaging studies  CBC: Recent Labs  Lab  02/02/19 2130 02/06/19 2028 02/06/19 2129 02/07/19 1113 02/08/19 0438  WBC 4.4 3.8* 3.3* 2.4* 2.4*  NEUTROABS  --  1.4* 1.3*  --   --   HGB 8.5* 7.8* 7.1* 6.7* 8.2*  HCT 26.1* 23.4* 22.0* 20.8* 25.7*  MCV 72.1* 70.5* 72.1* 73.2* 76.3*  PLT 118* 93* 81* 81* 71*   Basic Metabolic Panel: Recent Labs  Lab 02/02/19 2130 02/06/19 2028 02/06/19 2129 02/07/19 1113 02/08/19 0438  NA 127* 126* 127* 129* 133*  K 4.3 4.4 4.6 3.9 4.1  CL 93* 95* 98 103 105  CO2 20* 18* 17* 17* 18*  GLUCOSE 78 83 79 104* 68*  BUN 17 23* 22* 19 11  CREATININE 1.22* 1.36* 1.29* 0.88 0.70  CALCIUM 9.3 8.8* 8.3* 8.1* 8.4*   GFR: Estimated Creatinine Clearance: 69 mL/min (by C-G formula based on SCr of 0.7 mg/dL). Liver Function Tests: Recent Labs  Lab 02/06/19 2028 02/07/19 1113 02/08/19 0438  AST 24 20 20   ALT 8 10 8   ALKPHOS 107 92 85  BILITOT 0.2* 0.1* 0.6  PROT 6.4* 5.6* 5.1*  ALBUMIN 3.1* 2.8* 2.5*   Recent Labs  Lab 02/06/19 2028  LIPASE 41   No results for input(s): AMMONIA in the last 168 hours. Coagulation Profile: No results for input(s): INR, PROTIME in the last 168 hours. Cardiac Enzymes: No results for input(s): CKTOTAL, CKMB, CKMBINDEX, TROPONINI in the last 168 hours. BNP (last 3 results) No results for input(s): PROBNP in the last 8760 hours. HbA1C: No results for input(s): HGBA1C in the last 72 hours. CBG: No results for input(s): GLUCAP in the last 168 hours. Lipid Profile: No results for input(s): CHOL, HDL, LDLCALC, TRIG, CHOLHDL, LDLDIRECT in the last 72 hours. Thyroid Function Tests: No results for input(s): TSH, T4TOTAL, FREET4, T3FREE, THYROIDAB in the last 72 hours. Anemia Panel: Recent Labs    02/07/19 1113  VITAMINB12 1,239*  FOLATE 3.1*  FERRITIN 689*  TIBC 185*  IRON 138  RETICCTPCT 1.0   Urine analysis:    Component Value Date/Time   COLORURINE YELLOW 02/06/2019 2228   APPEARANCEUR CLEAR 02/06/2019 2228   LABSPEC 1.015 02/06/2019 2228    PHURINE 5.0 02/06/2019 2228   GLUCOSEU NEGATIVE 02/06/2019 2228   GLUCOSEU NEG mg/dL 12/29/2006 2001   HGBUR NEGATIVE 02/06/2019 2228   BILIRUBINUR NEGATIVE 02/06/2019 2228   KETONESUR NEGATIVE 02/06/2019 2228   PROTEINUR NEGATIVE 02/06/2019 2228   UROBILINOGEN 1 12/29/2006 2001   NITRITE NEGATIVE 02/06/2019 2228   LEUKOCYTESUR NEGATIVE 02/06/2019 2228   Sepsis Labs: @LABRCNTIP (procalcitonin:4,lacticidven:4)  ) Recent Results (from the past 240 hour(s))  SARS CORONAVIRUS 2 (TAT 6-24 HRS) Nasopharyngeal Nasopharyngeal Swab     Status: None   Collection Time: 02/06/19 10:28 PM   Specimen: Nasopharyngeal Swab  Result Value Ref Range Status   SARS Coronavirus 2 NEGATIVE NEGATIVE Final    Comment: (NOTE) SARS-CoV-2 target nucleic acids are NOT DETECTED. The SARS-CoV-2 RNA is generally detectable in upper and lower respiratory specimens during the acute phase of infection. Negative results do not preclude SARS-CoV-2 infection, do not rule out co-infections with other pathogens, and should not be used as the sole basis for treatment or other patient management decisions. Negative  results must be combined with clinical observations, patient history, and epidemiological information. The expected result is Negative. Fact Sheet for Patients: SugarRoll.be Fact Sheet for Healthcare Providers: https://www.woods-mathews.com/ This test is not yet approved or cleared by the [place] FDA and  has been authorized for detection and/or diagnosis of SARS-CoV-2 by FDA under an Emergency Use Authorization (EUA). This EUA will remain  in effect (meaning this test can be used) for the duration of the COVID-19 declaration under Section 56 4(b)(1) of the Act, 21 U.S.C. section 360bbb-3(b)(1), unless the authorization is terminated or revoked sooner. Performed at [facility], [place] [address], [place], [place] [address]   Respiratory Panel by PCR      Status: None   Collection Time: 02/07/19  4:48 PM   Specimen: Nasopharyngeal Swab; Respiratory  Result Value Ref Range Status   Adenovirus NOT DETECTED NOT DETECTED Final   Coronavirus 229E NOT DETECTED NOT DETECTED Final    Comment: (NOTE) The Coronavirus on the Respiratory Panel, DOES NOT test for the novel  Coronavirus (2019 nCoV)    Coronavirus HKU1 NOT DETECTED NOT DETECTED Final   Coronavirus NL63 NOT DETECTED NOT DETECTED Final   Coronavirus OC43 NOT DETECTED NOT DETECTED Final   Metapneumovirus NOT DETECTED NOT DETECTED Final   Rhinovirus / Enterovirus NOT DETECTED NOT DETECTED Final   Influenza A NOT DETECTED NOT DETECTED Final   Influenza B NOT DETECTED NOT DETECTED Final   Parainfluenza Virus 1 NOT DETECTED NOT DETECTED Final   Parainfluenza Virus 2 NOT DETECTED NOT DETECTED Final   Parainfluenza Virus 3 NOT DETECTED NOT DETECTED Final   Parainfluenza Virus 4 NOT DETECTED NOT DETECTED Final   Respiratory Syncytial Virus NOT DETECTED NOT DETECTED Final   Bordetella pertussis NOT DETECTED NOT DETECTED Final   Chlamydophila pneumoniae NOT DETECTED NOT DETECTED Final   Mycoplasma pneumoniae NOT DETECTED NOT DETECTED Final    Comment: Performed at [facility], [place]. [address] [place], [place] [address]         Radiology Studies: Dg Chest 2 View  Result Date: 02/08/2019 CLINICAL DATA:  Cough.  History of cancer.  History of HIV. EXAM: CHEST - 2 VIEW COMPARISON:  02/06/2019 FINDINGS: Decreased lung volume with mild bibasilar atelectasis compared to the prior study. No focal consolidation or pleural effusion. Heart size within normal limits. IMPRESSION: Hypoventilation with mild bibasilar atelectasis. Electronically Signed   By: Franchot Gallo M.D.   On: 02/08/2019 11:05   Dg Chest 2 View  Result Date: 02/06/2019 CLINICAL DATA:  Headache and chest pain EXAM: CHEST - 2 VIEW COMPARISON:  02/02/2019, 12/10/2018 FINDINGS: Patchy interstitial and vague airspace disease in  the right perihilar region and mid lung. No pleural effusion. Normal heart size. No pneumothorax. IMPRESSION: Similar appearance of hazy ground-glass opacity in the right perihilar region and mid to lower lung, possibly representing pneumonia, including atypical or viral pneumonia. Electronically Signed   By: Donavan Foil M.D.   On: 02/06/2019 21:24   Ct Head Wo Contrast  Result Date: 02/06/2019 CLINICAL DATA:  Vertigo EXAM: CT HEAD WITHOUT CONTRAST TECHNIQUE: Contiguous axial images were obtained from the base of the skull through the vertex without intravenous contrast. COMPARISON:  CT 02/03/2019, FINDINGS: Brain: No acute territorial infarction, hemorrhage or new intracranial mass. Previously described left middle cranial fossa mass is poorly visible without contrast. Mild atrophy. Stable ventricle size. Vascular: No hyperdense vessels. Scattered calcifications at the carotid siphon Skull: No fracture. Heterogenous and permeative changes of the  skull base and mandible Sinuses/Orbits: No acute finding. Other: None IMPRESSION: 1. No CT evidence for acute intracranial abnormality/changed since prior CT 02/03/2019 2. Mild atrophy 3. Redemonstrated heterogenous/permeative changes of the skull base and mandible Electronically Signed   By: Donavan Foil M.D.   On: 02/06/2019 21:22   Vas [place] Lower Extremity Venous (dvt)  Result Date: 02/07/2019  Lower Venous Study Indications: Swelling.  Risk Factors: None identified. Comparison Study: No prior studies. Performing Technologist: Oliver Hum RVT  Examination Guidelines: A complete evaluation includes B-mode imaging, spectral Doppler, color Doppler, and power Doppler as needed of all accessible portions of each vessel. Bilateral testing is considered an integral part of a complete examination. Limited examinations for reoccurring indications may be performed as noted.  +---------+---------------+---------+-----------+----------+--------------+ RIGHT     CompressibilityPhasicitySpontaneityPropertiesThrombus Aging +---------+---------------+---------+-----------+----------+--------------+ CFV      Full           Yes      Yes                                 +---------+---------------+---------+-----------+----------+--------------+ SFJ      Full                                                        +---------+---------------+---------+-----------+----------+--------------+ FV Prox  Full                                                        +---------+---------------+---------+-----------+----------+--------------+ FV Mid   Full                                                        +---------+---------------+---------+-----------+----------+--------------+ FV DistalFull                                                        +---------+---------------+---------+-----------+----------+--------------+ PFV      Full                                                        +---------+---------------+---------+-----------+----------+--------------+ POP      Full           Yes      Yes                                 +---------+---------------+---------+-----------+----------+--------------+ PTV      Full                                                        +---------+---------------+---------+-----------+----------+--------------+  PERO     Full                                                        +---------+---------------+---------+-----------+----------+--------------+   +---------+---------------+---------+-----------+----------+--------------+ LEFT     CompressibilityPhasicitySpontaneityPropertiesThrombus Aging +---------+---------------+---------+-----------+----------+--------------+ CFV      Full           Yes      Yes                                 +---------+---------------+---------+-----------+----------+--------------+ SFJ      Full                                                         +---------+---------------+---------+-----------+----------+--------------+ FV Prox  Full                                                        +---------+---------------+---------+-----------+----------+--------------+ FV Mid   Full                                                        +---------+---------------+---------+-----------+----------+--------------+ FV DistalFull                                                        +---------+---------------+---------+-----------+----------+--------------+ PFV      Full                                                        +---------+---------------+---------+-----------+----------+--------------+ POP      Full           Yes      Yes                                 +---------+---------------+---------+-----------+----------+--------------+ PTV      Full                                                        +---------+---------------+---------+-----------+----------+--------------+ PERO     Full                                                        +---------+---------------+---------+-----------+----------+--------------+  Summary: Right: There is no evidence of deep vein thrombosis in the lower extremity. No cystic structure found in the popliteal fossa. Left: There is no evidence of deep vein thrombosis in the lower extremity. No cystic structure found in the popliteal fossa.  *See table(s) above for measurements and observations. Electronically signed by Curt Jews MD on 02/07/2019 at 6:23:15 PM.    Final         Scheduled Meds: . [START ON 02/09/2019] azithromycin  500 mg Oral Daily  . bictegravir-emtricitabine-tenofovir AF  1 tablet Oral Daily  . citalopram  10 mg Oral Daily  . feeding supplement (ENSURE ENLIVE)  237 mL Oral TID BM  . folic acid  1 mg Oral Daily  . methadone  10 mg Oral BID  . multivitamin with minerals  1 tablet Oral Daily  . pantoprazole  40 mg Oral BID    Continuous Infusions: . sodium chloride 150 mL/hr at 02/08/19 1614     LOS: 1 day    Time spent: 35 minutes    Dana Allan, MD  [facility] Pager #: [phone] 7PM-7AM contact night coverage as above

## 2019-02-08 NOTE — Progress Notes (Signed)
Pt reports itching all over. MD made aware.

## 2019-02-08 NOTE — Progress Notes (Signed)
Pharmacy IV to PO  Protonix changed to 40mg  PO BID.  Azithromycin changed to 500mg  PO daily to start 11/6.  Neville Route, Student PharmD

## 2019-02-09 DIAGNOSIS — E871 Hypo-osmolality and hyponatremia: Secondary | ICD-10-CM

## 2019-02-09 DIAGNOSIS — D500 Iron deficiency anemia secondary to blood loss (chronic): Secondary | ICD-10-CM

## 2019-02-09 DIAGNOSIS — N179 Acute kidney failure, unspecified: Principal | ICD-10-CM

## 2019-02-09 DIAGNOSIS — X58XXXS Exposure to other specified factors, sequela: Secondary | ICD-10-CM

## 2019-02-09 DIAGNOSIS — M954 Acquired deformity of chest and rib: Secondary | ICD-10-CM

## 2019-02-09 DIAGNOSIS — J189 Pneumonia, unspecified organism: Secondary | ICD-10-CM

## 2019-02-09 DIAGNOSIS — S2220XS Unspecified fracture of sternum, sequela: Secondary | ICD-10-CM

## 2019-02-09 LAB — HIV-1 RNA QUANT-NO REFLEX-BLD
HIV 1 RNA Quant: 64600 copies/mL
LOG10 HIV-1 RNA: 4.81 log10copy/mL

## 2019-02-09 LAB — RENAL FUNCTION PANEL
Albumin: 2.5 g/dL — ABNORMAL LOW (ref 3.5–5.0)
Anion gap: 7 (ref 5–15)
BUN: 5 mg/dL — ABNORMAL LOW (ref 6–20)
CO2: 19 mmol/L — ABNORMAL LOW (ref 22–32)
Calcium: 8.5 mg/dL — ABNORMAL LOW (ref 8.9–10.3)
Chloride: 106 mmol/L (ref 98–111)
Creatinine, Ser: 0.57 mg/dL (ref 0.44–1.00)
GFR calc Af Amer: >60 mL/min (ref >60)
GFR calc non Af Amer: >60 mL/min (ref >60)
Glucose, Bld: 75 mg/dL (ref 70–99)
Phosphorus: 2.8 mg/dL (ref 2.5–4.6)
Potassium: 4.3 mmol/L (ref 3.5–5.1)
Sodium: 132 mmol/L — ABNORMAL LOW (ref 135–145)

## 2019-02-09 LAB — OSMOLALITY, URINE: Osmolality, Ur: 291 mOsm/kg — ABNORMAL LOW (ref 300–900)

## 2019-02-09 LAB — SODIUM, URINE, RANDOM: Sodium, Ur: 53 mmol/L

## 2019-02-09 LAB — MAGNESIUM: Magnesium: 1.6 mg/dL — ABNORMAL LOW (ref 1.7–2.4)

## 2019-02-09 LAB — OSMOLALITY: Osmolality: 264 mOsm/kg — ABNORMAL LOW (ref 275–295)

## 2019-02-09 MED ORDER — MAGNESIUM SULFATE 2 GM/50ML IV SOLN
2.0000 g | Freq: Once | INTRAVENOUS | Status: AC
  Administered 2019-02-09: 17:00:00 2 g via INTRAVENOUS
  Filled 2019-02-09: qty 50

## 2019-02-09 NOTE — Progress Notes (Signed)
Subjective:  Patient feels much better   Antibiotics:  Anti-infectives (From admission, onward)   Start     Dose/Rate Route Frequency Ordered Stop   02/09/19 1000  azithromycin (ZITHROMAX) tablet 500 mg     500 mg Oral Daily 02/08/19 1108     02/07/19 1300  bictegravir-emtricitabine-tenofovir AF (BIKTARVY) 50-200-25 MG per tablet 1 tablet     1 tablet Oral Daily 02/07/19 0917     02/07/19 1000  azithromycin (ZITHROMAX) 500 mg in sodium chloride 0.9 % 250 mL IVPB  Status:  Discontinued     500 mg 250 mL/hr over 60 Minutes Intravenous Every 24 hours 02/07/19 0918 02/08/19 1108      Medications: Scheduled Meds:  azithromycin  500 mg Oral Daily   bictegravir-emtricitabine-tenofovir AF  1 tablet Oral Daily   citalopram  10 mg Oral Daily   feeding supplement (ENSURE ENLIVE)  237 mL Oral TID BM   folic acid  1 mg Oral Daily   methadone  10 mg Oral BID   multivitamin with minerals  1 tablet Oral Daily   pantoprazole  40 mg Oral BID   Continuous Infusions:  PRN Meds:.acetaminophen **OR** acetaminophen, ondansetron **OR** ondansetron (ZOFRAN) IV    Objective: Weight change:   Intake/Output Summary (Last 24 hours) at 02/09/2019 1005 Last data filed at 02/09/2019 0600 Gross per 24 hour  Intake 1535.42 ml  Output --  Net 1535.42 ml   Blood pressure 97/65, pulse 60, temperature 98.4 F (36.9 C), temperature source Oral, resp. rate 18, height 5\' 5"  (1.651 m), weight 60 kg, SpO2 97 %. Temp:  [98.4 F (36.9 C)-98.8 F (37.1 C)] 98.4 F (36.9 C) (11/06 0516) Pulse Rate:  [57-64] 60 (11/06 0516) Resp:  [18-19] 18 (11/06 0516) BP: (92-111)/(59-73) 97/65 (11/06 0516) SpO2:  [97 %-100 %] 97 % (11/06 0516)  Physical Exam: General: Alert and awake, oriented x3, not in any acute distress. HEENT: anicteric sclera, EOMI, has chronic facial asymmetry CVS regular rate, normal n o mgr Chest: , no wheezing, no respiratory distress and lungs clear she has deformity  from her sternal fracture Abdomen: soft non-distended,  Skin: no rashes Neuro: nonfocal  CBC:    BMET Recent Labs    02/08/19 0438 02/09/19 0447  NA 133* 132*  K 4.1 4.3  CL 105 106  CO2 18* 19*  GLUCOSE 68* 75  BUN 11 5*  CREATININE 0.70 0.57  CALCIUM 8.4* 8.5*     Liver Panel  Recent Labs    02/07/19 1113 02/08/19 0438 02/09/19 0447  PROT 5.6* 5.1*  --   ALBUMIN 2.8* 2.5* 2.5*  AST 20 20  --   ALT 10 8  --   ALKPHOS 92 85  --   BILITOT 0.1* 0.6  --        Sedimentation Rate No results for input(s): ESRSEDRATE in the last 72 hours. C-Reactive Protein Recent Labs    02/07/19 1113  CRP 1.6*    Micro Results: Recent Results (from the past 720 hour(s))  SARS CORONAVIRUS 2 (TAT 6-24 HRS) Nasopharyngeal Nasopharyngeal Swab     Status: None   Collection Time: 02/06/19 10:28 PM   Specimen: Nasopharyngeal Swab  Result Value Ref Range Status   SARS Coronavirus 2 NEGATIVE NEGATIVE Final    Comment: (NOTE) SARS-CoV-2 target nucleic acids are NOT DETECTED. The SARS-CoV-2 RNA is generally detectable in upper and lower respiratory specimens during the acute phase of infection. Negative results do not  preclude SARS-CoV-2 infection, do not rule out co-infections with other pathogens, and should not be used as the sole basis for treatment or other patient management decisions. Negative results must be combined with clinical observations, patient history, and epidemiological information. The expected result is Negative. Fact Sheet for Patients: SugarRoll.be Fact Sheet for Healthcare Providers: https://www.woods-mathews.com/ This test is not yet approved or cleared by the [place] FDA and  has been authorized for detection and/or diagnosis of SARS-CoV-2 by FDA under an Emergency Use Authorization (EUA). This EUA will remain  in effect (meaning this test can be used) for the duration of the COVID-19 declaration  under Section 56 4(b)(1) of the Act, 21 U.S.C. section 360bbb-3(b)(1), unless the authorization is terminated or revoked sooner. Performed at [facility], [place] [address] [place], [place] [address]   Respiratory Panel by PCR     Status: None   Collection Time: 02/07/19  4:48 PM   Specimen: Nasopharyngeal Swab; Respiratory  Result Value Ref Range Status   Adenovirus NOT DETECTED NOT DETECTED Final   Coronavirus 229E NOT DETECTED NOT DETECTED Final    Comment: (NOTE) The Coronavirus on the Respiratory Panel, DOES NOT test for the novel  Coronavirus (2019 nCoV)    Coronavirus HKU1 NOT DETECTED NOT DETECTED Final   Coronavirus NL63 NOT DETECTED NOT DETECTED Final   Coronavirus OC43 NOT DETECTED NOT DETECTED Final   Metapneumovirus NOT DETECTED NOT DETECTED Final   Rhinovirus / Enterovirus NOT DETECTED NOT DETECTED Final   Influenza A NOT DETECTED NOT DETECTED Final   Influenza B NOT DETECTED NOT DETECTED Final   Parainfluenza Virus 1 NOT DETECTED NOT DETECTED Final   Parainfluenza Virus 2 NOT DETECTED NOT DETECTED Final   Parainfluenza Virus 3 NOT DETECTED NOT DETECTED Final   Parainfluenza Virus 4 NOT DETECTED NOT DETECTED Final   Respiratory Syncytial Virus NOT DETECTED NOT DETECTED Final   Bordetella pertussis NOT DETECTED NOT DETECTED Final   Chlamydophila pneumoniae NOT DETECTED NOT DETECTED Final   Mycoplasma pneumoniae NOT DETECTED NOT DETECTED Final    Comment: Performed at [facility], [place]. [address] [place], [place] [address]    Studies/Results: Dg Chest 2 View  Result Date: 02/08/2019 CLINICAL DATA:  Cough.  History of cancer.  History of HIV. EXAM: CHEST - 2 VIEW COMPARISON:  02/06/2019 FINDINGS: Decreased lung volume with mild bibasilar atelectasis compared to the prior study. No focal consolidation or pleural effusion. Heart size within normal limits. IMPRESSION: Hypoventilation with mild bibasilar atelectasis. Electronically Signed   By: Franchot Gallo  M.D.   On: 02/08/2019 11:05   Vas [place] Lower Extremity Venous (dvt)  Result Date: 02/07/2019  Lower Venous Study Indications: Swelling.  Risk Factors: None identified. Comparison Study: No prior studies. Performing Technologist: Oliver Hum RVT  Examination Guidelines: A complete evaluation includes B-mode imaging, spectral Doppler, color Doppler, and power Doppler as needed of all accessible portions of each vessel. Bilateral testing is considered an integral part of a complete examination. Limited examinations for reoccurring indications may be performed as noted.  +---------+---------------+---------+-----------+----------+--------------+  RIGHT     Compressibility Phasicity Spontaneity Properties Thrombus Aging  +---------+---------------+---------+-----------+----------+--------------+  CFV       Full            Yes       Yes                                    +---------+---------------+---------+-----------+----------+--------------+  SFJ       Full                                                             +---------+---------------+---------+-----------+----------+--------------+  FV Prox   Full                                                             +---------+---------------+---------+-----------+----------+--------------+  FV Mid    Full                                                             +---------+---------------+---------+-----------+----------+--------------+  FV Distal Full                                                             +---------+---------------+---------+-----------+----------+--------------+  PFV       Full                                                             +---------+---------------+---------+-----------+----------+--------------+  POP       Full            Yes       Yes                                    +---------+---------------+---------+-----------+----------+--------------+  PTV       Full                                                              +---------+---------------+---------+-----------+----------+--------------+  PERO      Full                                                             +---------+---------------+---------+-----------+----------+--------------+   +---------+---------------+---------+-----------+----------+--------------+  LEFT      Compressibility Phasicity Spontaneity Properties Thrombus Aging  +---------+---------------+---------+-----------+----------+--------------+  CFV       Full            Yes       Yes                                    +---------+---------------+---------+-----------+----------+--------------+  SFJ       Full                                                             +---------+---------------+---------+-----------+----------+--------------+  FV Prox   Full                                                             +---------+---------------+---------+-----------+----------+--------------+  FV Mid    Full                                                             +---------+---------------+---------+-----------+----------+--------------+  FV Distal Full                                                             +---------+---------------+---------+-----------+----------+--------------+  PFV       Full                                                             +---------+---------------+---------+-----------+----------+--------------+  POP       Full            Yes       Yes                                    +---------+---------------+---------+-----------+----------+--------------+  PTV       Full                                                             +---------+---------------+---------+-----------+----------+--------------+  PERO      Full                                                             +---------+---------------+---------+-----------+----------+--------------+     Summary: Right: There is no evidence of deep vein thrombosis in the lower extremity. No cystic structure found in the  popliteal fossa. Left: There is no evidence of deep vein thrombosis in the lower extremity. No cystic structure found in the popliteal fossa.  *See table(s) above for measurements and observations. Electronically signed  by Curt Jews MD on 02/07/2019 at 6:23:15 PM.    Final       Assessment/Plan:  INTERVAL HISTORY:CXR read as atelectasis   Active Problems:   HIV INFECTION, PRIMARY   IRON DEFICIENCY ANEMIA SECONDARY TO BLOOD LOSS   HTN (hypertension)   Depression   AKI (acute kidney injury) ([place])   Hyponatremia   Primary adenocarcinoma of parotid gland (HCC)   Intractable nausea and vomiting   Nausea & vomiting   Atypical pneumonia    LOLISA MUSCH is a 58 y.o. 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 THere was ? Of PNA  #1 Possibly PNA: I am skeptical that she has significant PNA with parenchyma clear on CTA on 1031. Certainly would be shocking for PCP to show up so quickly  She had loss of smell but over several months  COVID test negative Resp virus negative  OK to keep going with azithromycin just in case she has atypical bacterial infection  #2 Headaches: crypto ag negative, RPR pending  #3 HIV; HIV quant sent. I suspect her CD4 is low from her acute problems and or chronic issues related to her poor health, radiation and other variables  #4 Metastatic adenocarcinoma of parotid on radiation at [facility] #5 Polysubstance abuse  #6 Goals of care: she would benefit from palliative care consult to optimize pain control and quality of life in my opinion  She does NOT appear to have any complicated ID issues at this time  I will sign off  She can followup with [facility] in [facility].   LOS: 2 days   Alcide Evener 02/09/2019, 10:05 AM

## 2019-02-09 NOTE — Evaluation (Signed)
Clinical/Bedside Swallow Evaluation Patient Details  Name: Kristen Roman MRN: [id number] Date of Birth: 1961-01-05  Today's Date: 02/09/2019 Time: SLP Start Time (ACUTE ONLY): 1203 SLP Stop Time (ACUTE ONLY): 1225 SLP Time Calculation (min) (ACUTE ONLY): 22 min  Past Medical History:  Past Medical History:  Diagnosis Date  . Cancer ([place])    "bone cancer"  . Hemorrhoid   . HIV disease ([place])   . Hypertension    Past Surgical History:  Past Surgical History:  Procedure Laterality Date  . ABDOMINAL HYSTERECTOMY    . CESAREAN SECTION     HPI:  58 yo female adm to [facility] with intractable nausea and vomiting - HA and palpitations.  Pt with h/o HIV, parotid gland adenocarcinoma with mets to the bone, Bell's palsy *on right, and ETOH use.  Pt COVID test was negative.  She is on methadone for chronic pain.  Pt chest imaging showed ATX and hypoventilation. Swallow evaluation ordered - pt with h/o radiation tx *which she was unable to finish due to death in her family per her report* therefore stoopped XRT in August 2020. She denies requiring chemotherapy.  Does admit to occasional coughing with intake.  Reports poor appetite lately but eats whatever she is craving - she is edentulous. Had dentures but does not use them due to ill fitting.   Assessment / Plan / Recommendation Clinical Impression  Pt presents with facial nerve impairment from her Bell's palsy with decreased eye opening, significant facial droop and decreased right labial seal.  However her swallow function is functional for her - as she modulates her diet independently given her awareness to her dysphagia. Pt easliy passed [place] 3 ounce water test without difficulties.  Recommend diet as tolerated so she may order preferred foods within her ability.  Will follow up to provide her with swallowing exercises to decrease fibrosis from XRT as she states she has not seen an SLP previously. Thanks for this consult. SLP Visit Diagnosis:  Dysphagia, oral phase (R13.11)    Aspiration Risk  Mild aspiration risk    Diet Recommendation Regular;Thin liquid   Liquid Administration via: Straw Medication Administration: Whole meds with liquid Supervision: Patient able to self feed Compensations: Slow rate;Small sips/bites;Other (Comment)(swish and expetorate with water after meals)    Other  Recommendations Oral Care Recommendations: Oral care BID   Follow up Recommendations        Frequency and Duration min 2x/week  2 weeks       Prognosis    n/a    Swallow Study   General Date of Onset: 02/09/19 HPI: 58 yo female adm to [facility] with intractable nausea and vomiting - HA and palpitations.  Pt with h/o HIV, parotid gland adenocarcinoma with mets to the bone, Bell's palsy *on right, and ETOH use.  Pt COVID test was negative.  She is on methadone for chronic pain.  Pt chest imaging showed ATX and hypoventilation. Swallow evaluation ordered - pt with h/o radiation tx *which she was unable to finish due to death in her family per her report* therefore stoopped XRT in August 2020. She denies requiring chemotherapy.  Does admit to occasional coughing with intake.  Reports poor appetite lately but eats whatever she is craving - she is edentulous. Had dentures but does not use them due to ill fitting. Type of Study: Bedside Swallow Evaluation Diet Prior to this Study: Other (Comment)(full liquids) Temperature Spikes Noted: No Respiratory Status: Room air History of Recent Intubation: No Behavior/Cognition: Alert;Cooperative;Pleasant mood  Oral Cavity Assessment: Within Functional Limits Oral Care Completed by SLP: No Oral Cavity - Dentition: Edentulous Vision: Functional for self-feeding Self-Feeding Abilities: Able to feed self Patient Positioning: Upright in bed Baseline Vocal Quality: Normal Volitional Cough: Strong Volitional Swallow: Able to elicit    Oral/Motor/Sensory Function Overall Oral Motor/Sensory Function: Mild  impairment Facial ROM: Reduced right Facial Symmetry: Abnormal symmetry right Facial Strength: Reduced right Lingual ROM: Within Functional Limits Lingual Symmetry: Within Functional Limits Lingual Strength: Within Functional Limits Velum: Within Functional Limits   Ice Chips Ice chips: Not tested   Thin Liquid Thin Liquid: Within functional limits Presentation: Straw Other Comments: yale 3 ounce water test completed with pt tolerating well - use of straw helpful to prevent anterior labial spillage    Nectar Thick Nectar Thick Liquid: Not tested   Honey Thick Honey Thick Liquid: Not tested   Puree Puree: Within functional limits Presentation: Self Fed;Spoon   Solid     Solid: Within functional limits Presentation: Self Fed;Spoon Other Comments: peanut butter from spoon, pt declined to consume graham cracker stating it would hurt her gums, she is aware and self modulates diet independently      Macario Golds 02/09/2019,3:35 PM  Luanna Salk, [facility] SLP [place] Pager [phone] Office [phone]

## 2019-02-09 NOTE — Progress Notes (Signed)
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  Patient was admitted with headache and palpitations.  No associated fever or chills.  No cough or other URI symptoms reported.  However, patient endorsed loss of taste, shortness of breath, nausea vomiting.  CD4 count on 02/07/2019 was 150.  COVID-19 came back negative.  Respiratory panel by PCR came back negative.  Cryptococcal antigen came back negative.  Urine toxicology was positive for cocaine and tetrahydrocannabinol.  Chest x-ray revealed hypoventilation with bibasilar atelectasis.  Vascular ultrasound of the lower extremities came back negative.  CT head without contrast did not show any acute abnormalities.  Infectious disease input is highly appreciated.  02/09/2019: Patient seen.  Patient has improved, but continues to report significant nausea.  Patient is not keen on being discharged back home.  Patient has been cleared for discharge by the infectious disease team.  Hopefully, patient will be discharged back home tomorrow.  Assessment & Plan:   Active Problems:   HIV INFECTION, PRIMARY   IRON DEFICIENCY ANEMIA SECONDARY TO BLOOD LOSS   HTN (hypertension)   Depression   AKI (acute kidney injury) ([place])   Hyponatremia   Primary adenocarcinoma of parotid gland (HCC)   Intractable nausea and vomiting   Nausea & vomiting   Atypical pneumonia  Nausea vomiting:  -Continue supportive management.   -Adequate hydration.   -Continue Zofran as needed.   -COVID-19 came back negative. -Normal lipase level. -Further management depend on hospital course. 02/09/2019: Continues to report nausea.  Continue current  management.  Likely discharge tomorrow.  Abnormal chest x-ray:  -Concerns for possible atypical pneumonia -Patient is on azithromycin. -Repeat chest x-ray today revealed hypoventilation and bibasilar atelectasis.   -Continue to assess. -Infectious disease and PT is appreciated. -Respiratory viral panel came back negative. -COVID-19 test came back negative. 02/09/2019: Infectious disease has cleared patient for discharge from infectious disease point.  Likely discharge home tomorrow.  Anemia:  -Iron panel is suggestive of anemia of chronic inflammation. -Significantly elevated ferritin (689) -Hemoglobin was 6.7 g/dL yesterday, transfused with 1 unit of packed red blood cells, hemoglobin today is 8.2 g/dL today. -Continue to monitor clinically.    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   -Discontinue IV fluids. -Repeat urine sodium, serum and urine osmolality.  HIV: Check CD4 count is 150 Infectious disease is following patient.   Continue with Biktarvi.  Will defer management to infectious disease team.  AKI;  Resolved.  Likely prerenal. Serum creatinine today 0.7.    UDS positive for cocaine:  Counseled.    Chronic pain;  On methadone  Discontinued opiates.   DVT prophylaxis: SCD Code Status: Full code Family Communication:  Disposition Plan: Home eventually   Consultants:   Infectious disease  Procedures:   None  Antimicrobials:   Oral azithromycin   Subjective: Continues to report nausea.  Objective: Vitals:   02/08/19 1546 02/08/19 2146 02/09/19 0516 02/09/19 1301  BP: 97/72 111/73 97/65 (!) 88/63  Pulse: 60 64 60 61  Resp:  18 18 12   Temp:  98.8 F (37.1 C) 98.4 F (36.9 C) 98.4 F (36.9 C)  TempSrc:  Oral Oral Oral  SpO2:  100% 97% 99%  Weight:  Height:        Intake/Output Summary (Last 24 hours) at 02/09/2019 1555 Last data filed at 02/09/2019 0600 Gross per 24 hour  Intake 240 ml  Output -  Net  240 ml   Filed Weights   02/06/19 1959  Weight: 60 kg    Examination:  General exam: Appears calm and comfortable.  Right-sided facial trauma. Respiratory system: Clear to auscultation.  Cardiovascular system: S1 & S2 heard Gastrointestinal system: Abdomen is nondistended, soft and nontender. No organomegaly or masses felt. Normal bowel sounds heard. Central nervous system: Alert and oriented.  Patient moves all extremities.    Data Reviewed: I have personally reviewed following labs and imaging studies  CBC: Recent Labs  Lab 02/02/19 2130 02/06/19 2028 02/06/19 2129 02/07/19 1113 02/08/19 0438  WBC 4.4 3.8* 3.3* 2.4* 2.4*  NEUTROABS  --  1.4* 1.3*  --   --   HGB 8.5* 7.8* 7.1* 6.7* 8.2*  HCT 26.1* 23.4* 22.0* 20.8* 25.7*  MCV 72.1* 70.5* 72.1* 73.2* 76.3*  PLT 118* 93* 81* 81* 71*   Basic Metabolic Panel: Recent Labs  Lab 02/06/19 2028 02/06/19 2129 02/07/19 1113 02/08/19 0438 02/09/19 0447  NA 126* 127* 129* 133* 132*  K 4.4 4.6 3.9 4.1 4.3  CL 95* 98 103 105 106  CO2 18* 17* 17* 18* 19*  GLUCOSE 83 79 104* 68* 75  BUN 23* 22* 19 11 5*  CREATININE 1.36* 1.29* 0.88 0.70 0.57  CALCIUM 8.8* 8.3* 8.1* 8.4* 8.5*  MG  --   --   --   --  1.6*  PHOS  --   --   --   --  2.8   GFR: Estimated Creatinine Clearance: 69 mL/min (by C-G formula based on SCr of 0.57 mg/dL). Liver Function Tests: Recent Labs  Lab 02/06/19 2028 02/07/19 1113 02/08/19 0438 02/09/19 0447  AST 24 20 20   --   ALT 8 10 8   --   ALKPHOS 107 92 85  --   BILITOT 0.2* 0.1* 0.6  --   PROT 6.4* 5.6* 5.1*  --   ALBUMIN 3.1* 2.8* 2.5* 2.5*   Recent Labs  Lab 02/06/19 2028  LIPASE 41   No results for input(s): AMMONIA in the last 168 hours. Coagulation Profile: No results for input(s): INR, PROTIME in the last 168 hours. Cardiac Enzymes: No results for input(s): CKTOTAL, CKMB, CKMBINDEX, TROPONINI in the last 168 hours. BNP (last 3 results) No results for input(s): PROBNP in the last  8760 hours. HbA1C: No results for input(s): HGBA1C in the last 72 hours. CBG: No results for input(s): GLUCAP in the last 168 hours. Lipid Profile: No results for input(s): CHOL, HDL, LDLCALC, TRIG, CHOLHDL, LDLDIRECT in the last 72 hours. Thyroid Function Tests: No results for input(s): TSH, T4TOTAL, FREET4, T3FREE, THYROIDAB in the last 72 hours. Anemia Panel: Recent Labs    02/07/19 1113  VITAMINB12 1,239*  FOLATE 3.1*  FERRITIN 689*  TIBC 185*  IRON 138  RETICCTPCT 1.0   Urine analysis:    Component Value Date/Time   COLORURINE YELLOW 02/06/2019 2228   APPEARANCEUR CLEAR 02/06/2019 2228   LABSPEC 1.015 02/06/2019 2228   PHURINE 5.0 02/06/2019 2228   GLUCOSEU NEGATIVE 02/06/2019 2228   GLUCOSEU NEG mg/dL 12/29/2006 2001   HGBUR NEGATIVE 02/06/2019 2228   BILIRUBINUR NEGATIVE 02/06/2019 2228   KETONESUR NEGATIVE 02/06/2019 2228   PROTEINUR NEGATIVE 02/06/2019 2228   UROBILINOGEN 1 12/29/2006 2001   NITRITE NEGATIVE 02/06/2019 2228  LEUKOCYTESUR NEGATIVE 02/06/2019 2228   Sepsis Labs: @LABRCNTIP (procalcitonin:4,lacticidven:4)  ) Recent Results (from the past 240 hour(s))  SARS CORONAVIRUS 2 (TAT 6-24 HRS) Nasopharyngeal Nasopharyngeal Swab     Status: None   Collection Time: 02/06/19 10:28 PM   Specimen: Nasopharyngeal Swab  Result Value Ref Range Status   SARS Coronavirus 2 NEGATIVE NEGATIVE Final    Comment: (NOTE) SARS-CoV-2 target nucleic acids are NOT DETECTED. The SARS-CoV-2 RNA is generally detectable in upper and lower respiratory specimens during the acute phase of infection. Negative results do not preclude SARS-CoV-2 infection, do not rule out co-infections with other pathogens, and should not be used as the sole basis for treatment or other patient management decisions. Negative results must be combined with clinical observations, patient history, and epidemiological information. The expected result is Negative. Fact Sheet for Patients:  SugarRoll.be Fact Sheet for Healthcare Providers: https://www.woods-mathews.com/ This test is not yet approved or cleared by the [place] FDA and  has been authorized for detection and/or diagnosis of SARS-CoV-2 by FDA under an Emergency Use Authorization (EUA). This EUA will remain  in effect (meaning this test can be used) for the duration of the COVID-19 declaration under Section 56 4(b)(1) of the Act, 21 U.S.C. section 360bbb-3(b)(1), unless the authorization is terminated or revoked sooner. Performed at [facility], [place] [address], [place], [place] [address]   Respiratory Panel by PCR     Status: None   Collection Time: 02/07/19  4:48 PM   Specimen: Nasopharyngeal Swab; Respiratory  Result Value Ref Range Status   Adenovirus NOT DETECTED NOT DETECTED Final   Coronavirus 229E NOT DETECTED NOT DETECTED Final    Comment: (NOTE) The Coronavirus on the Respiratory Panel, DOES NOT test for the novel  Coronavirus (2019 nCoV)    Coronavirus HKU1 NOT DETECTED NOT DETECTED Final   Coronavirus NL63 NOT DETECTED NOT DETECTED Final   Coronavirus OC43 NOT DETECTED NOT DETECTED Final   Metapneumovirus NOT DETECTED NOT DETECTED Final   Rhinovirus / Enterovirus NOT DETECTED NOT DETECTED Final   Influenza A NOT DETECTED NOT DETECTED Final   Influenza B NOT DETECTED NOT DETECTED Final   Parainfluenza Virus 1 NOT DETECTED NOT DETECTED Final   Parainfluenza Virus 2 NOT DETECTED NOT DETECTED Final   Parainfluenza Virus 3 NOT DETECTED NOT DETECTED Final   Parainfluenza Virus 4 NOT DETECTED NOT DETECTED Final   Respiratory Syncytial Virus NOT DETECTED NOT DETECTED Final   Bordetella pertussis NOT DETECTED NOT DETECTED Final   Chlamydophila pneumoniae NOT DETECTED NOT DETECTED Final   Mycoplasma pneumoniae NOT DETECTED NOT DETECTED Final    Comment: Performed at [facility], [place]. [address], [place], [place] [address]          Radiology Studies: Dg Chest 2 View  Result Date: 02/08/2019 CLINICAL DATA:  Cough.  History of cancer.  History of HIV. EXAM: CHEST - 2 VIEW COMPARISON:  02/06/2019 FINDINGS: Decreased lung volume with mild bibasilar atelectasis compared to the prior study. No focal consolidation or pleural effusion. Heart size within normal limits. IMPRESSION: Hypoventilation with mild bibasilar atelectasis. Electronically Signed   By: Franchot Gallo M.D.   On: 02/08/2019 11:05        Scheduled Meds: . azithromycin  500 mg Oral Daily  . bictegravir-emtricitabine-tenofovir AF  1 tablet Oral Daily  . citalopram  10 mg Oral Daily  . feeding supplement (ENSURE ENLIVE)  237 mL Oral TID BM  . folic acid  1 mg Oral Daily  .  methadone  10 mg Oral BID  . multivitamin with minerals  1 tablet Oral Daily  . pantoprazole  40 mg Oral BID   Continuous Infusions: . magnesium sulfate bolus IVPB       LOS: 2 days    Time spent: 25 minutes    Dana Allan, MD  [facility] Pager #: [phone] 7PM-7AM contact night coverage as above

## 2019-02-09 NOTE — Progress Notes (Signed)
BSE completed, full report to follow.  Diet advanced to regular/thn.    Luanna Salk, [facility] SLP Acute Rehab Services Pager [phone] Office [phone]

## 2019-02-09 NOTE — Progress Notes (Signed)
Patients IV infiltrated and was painful to touch, RN removed IV and attempted to restart but patient is refusing to be stuck at this time. Night shift RN aware.

## 2019-02-10 NOTE — Progress Notes (Signed)
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  Patient was admitted with headache and palpitations.  No associated fever or chills.  No cough or other URI symptoms reported.  However, patient endorsed loss of taste, shortness of breath, nausea vomiting.  CD4 count on 02/07/2019 was 150.  COVID-19 came back negative.  Respiratory panel by PCR came back negative.  Cryptococcal antigen came back negative.  Urine toxicology was positive for cocaine and tetrahydrocannabinol.  Chest x-ray revealed hypoventilation with bibasilar atelectasis.  Vascular ultrasound of the lower extremities came back negative.  CT head without contrast did not show any acute abnormalities.  Infectious disease input is highly appreciated.  02/09/2019: Patient seen.  Patient has improved, but continues to report significant nausea.  Patient is not keen on being discharged back home.  Patient has been cleared for discharge by the infectious disease team.  Hopefully, patient will be discharged back home tomorrow.  02/10/2019: Patient seen.  Discussed discharge home with patient.  Patient tells me that she does not have access to her place of abode!!!  Hopefully, patient will agree to be discharged back on tomorrow.  We will get case management to assist with home issues.  Assessment & Plan:   Active Problems:   HIV INFECTION, PRIMARY   IRON DEFICIENCY ANEMIA SECONDARY TO BLOOD LOSS   HTN (hypertension)   Depression   AKI (acute kidney injury) ([place])   Hyponatremia   Primary adenocarcinoma of parotid gland (HCC)   Intractable nausea and vomiting   Nausea & vomiting   Atypical pneumonia  Nausea  vomiting:  -Continue supportive management.   -Adequate hydration.   -Continue Zofran as needed.   -COVID-19 came back negative. -Normal lipase level. -Further management depend on hospital course. 02/09/2019: Continues to report nausea.  Continue current management.  Likely discharge tomorrow. 02/10/2019: Resolved significantly.  Pursue disposition.  Abnormal chest x-ray:  -Concerns for possible atypical pneumonia -Patient is on azithromycin. -Repeat chest x-ray today revealed hypoventilation and bibasilar atelectasis.   -Continue to assess. -Infectious disease and PT is appreciated. -Respiratory viral panel came back negative. -COVID-19 test came back negative. 02/09/2019: Infectious disease has cleared patient for discharge from infectious disease point.  Likely discharge home tomorrow.  Anemia:  -Iron panel is suggestive of anemia of chronic inflammation. -Significantly elevated ferritin (689) -Hemoglobin was 6.7 g/dL yesterday, transfused with 1 unit of packed red blood cells, hemoglobin today is 8.2 g/dL today. -Continue to monitor clinically.    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   -Discontinue IV fluids. -Repeat urine sodium, serum and urine osmolality.  HIV: Check CD4 count is 150 Infectious disease is following patient.   Continue with Biktarvi.  Will defer management to infectious disease team.  AKI;  Resolved.  Likely prerenal. Serum creatinine today 0.7.    UDS positive for cocaine:  Counseled.    Chronic pain;  On methadone  Discontinued opiates.   DVT prophylaxis: SCD Code Status: Full code Family Communication:  Disposition Plan: Home eventually   Consultants:   Infectious disease  Procedures:   None  Antimicrobials:   Oral azithromycin   Subjective: Continues to report nausea.  Objective: Vitals:   02/09/19 2111 02/10/19  0457 02/10/19 0817 02/10/19 1338  BP: 97/67 (!) 83/56 101/70 (!) 96/57   Pulse: 61 (!) 59 (!) 59 (!) 58  Resp: 15 15  18   Temp: 98.4 F (36.9 C) 98.2 F (36.8 C)  98.7 F (37.1 C)  TempSrc: Oral Oral    SpO2: 100% 100%  100%  Weight:      Height:        Intake/Output Summary (Last 24 hours) at 02/10/2019 1437 Last data filed at 02/10/2019 0800 Gross per 24 hour  Intake 250 ml  Output 300 ml  Net -50 ml   Filed Weights   02/06/19 1959  Weight: 60 kg    Examination:  General exam: Appears calm and comfortable.  Right-sided facial trauma. Respiratory system: Clear to auscultation.  Cardiovascular system: S1 & S2 heard Gastrointestinal system: Abdomen is nondistended, soft and nontender. No organomegaly or masses felt. Normal bowel sounds heard. Central nervous system: Alert and oriented.  Patient moves all extremities.    Data Reviewed: I have personally reviewed following labs and imaging studies  CBC: Recent Labs  Lab 02/06/19 2028 02/06/19 2129 02/07/19 1113 02/08/19 0438  WBC 3.8* 3.3* 2.4* 2.4*  NEUTROABS 1.4* 1.3*  --   --   HGB 7.8* 7.1* 6.7* 8.2*  HCT 23.4* 22.0* 20.8* 25.7*  MCV 70.5* 72.1* 73.2* 76.3*  PLT 93* 81* 81* 71*   Basic Metabolic Panel: Recent Labs  Lab 02/06/19 2028 02/06/19 2129 02/07/19 1113 02/08/19 0438 02/09/19 0447  NA 126* 127* 129* 133* 132*  K 4.4 4.6 3.9 4.1 4.3  CL 95* 98 103 105 106  CO2 18* 17* 17* 18* 19*  GLUCOSE 83 79 104* 68* 75  BUN 23* 22* 19 11 5*  CREATININE 1.36* 1.29* 0.88 0.70 0.57  CALCIUM 8.8* 8.3* 8.1* 8.4* 8.5*  MG  --   --   --   --  1.6*  PHOS  --   --   --   --  2.8   GFR: Estimated Creatinine Clearance: 69 mL/min (by C-G formula based on SCr of 0.57 mg/dL). Liver Function Tests: Recent Labs  Lab 02/06/19 2028 02/07/19 1113 02/08/19 0438 02/09/19 0447  AST 24 20 20   --   ALT 8 10 8   --   ALKPHOS 107 92 85  --   BILITOT 0.2* 0.1* 0.6  --   PROT 6.4* 5.6* 5.1*  --   ALBUMIN 3.1* 2.8* 2.5* 2.5*   Recent Labs  Lab 02/06/19 2028  LIPASE 41   No results for  input(s): AMMONIA in the last 168 hours. Coagulation Profile: No results for input(s): INR, PROTIME in the last 168 hours. Cardiac Enzymes: No results for input(s): CKTOTAL, CKMB, CKMBINDEX, TROPONINI in the last 168 hours. BNP (last 3 results) No results for input(s): PROBNP in the last 8760 hours. HbA1C: No results for input(s): HGBA1C in the last 72 hours. CBG: No results for input(s): GLUCAP in the last 168 hours. Lipid Profile: No results for input(s): CHOL, HDL, LDLCALC, TRIG, CHOLHDL, LDLDIRECT in the last 72 hours. Thyroid Function Tests: No results for input(s): TSH, T4TOTAL, FREET4, T3FREE, THYROIDAB in the last 72 hours. Anemia Panel: No results for input(s): VITAMINB12, FOLATE, FERRITIN, TIBC, IRON, RETICCTPCT in the last 72 hours. Urine analysis:    Component Value Date/Time   COLORURINE YELLOW 02/06/2019 2228   APPEARANCEUR CLEAR 02/06/2019 2228   LABSPEC 1.015 02/06/2019 2228   PHURINE 5.0 02/06/2019 2228   GLUCOSEU NEGATIVE 02/06/2019 2228   GLUCOSEU NEG  mg/dL 12/29/2006 2001   HGBUR NEGATIVE 02/06/2019 2228   BILIRUBINUR NEGATIVE 02/06/2019 2228   KETONESUR NEGATIVE 02/06/2019 2228   PROTEINUR NEGATIVE 02/06/2019 2228   UROBILINOGEN 1 12/29/2006 2001   NITRITE NEGATIVE 02/06/2019 2228   LEUKOCYTESUR NEGATIVE 02/06/2019 2228   Sepsis Labs: @LABRCNTIP (procalcitonin:4,lacticidven:4)  ) Recent Results (from the past 240 hour(s))  SARS CORONAVIRUS 2 (TAT 6-24 HRS) Nasopharyngeal Nasopharyngeal Swab     Status: None   Collection Time: 02/06/19 10:28 PM   Specimen: Nasopharyngeal Swab  Result Value Ref Range Status   SARS Coronavirus 2 NEGATIVE NEGATIVE Final    Comment: (NOTE) SARS-CoV-2 target nucleic acids are NOT DETECTED. The SARS-CoV-2 RNA is generally detectable in upper and lower respiratory specimens during the acute phase of infection. Negative results do not preclude SARS-CoV-2 infection, do not rule out co-infections with other pathogens, and  should not be used as the sole basis for treatment or other patient management decisions. Negative results must be combined with clinical observations, patient history, and epidemiological information. The expected result is Negative. Fact Sheet for Patients: SugarRoll.be Fact Sheet for Healthcare Providers: https://www.woods-mathews.com/ This test is not yet approved or cleared by the [place] FDA and  has been authorized for detection and/or diagnosis of SARS-CoV-2 by FDA under an Emergency Use Authorization (EUA). This EUA will remain  in effect (meaning this test can be used) for the duration of the COVID-19 declaration under Section 56 4(b)(1) of the Act, 21 U.S.C. section 360bbb-3(b)(1), unless the authorization is terminated or revoked sooner. Performed at [facility], [place] [address], [place], [place] [address]   Respiratory Panel by PCR     Status: None   Collection Time: 02/07/19  4:48 PM   Specimen: Nasopharyngeal Swab; Respiratory  Result Value Ref Range Status   Adenovirus NOT DETECTED NOT DETECTED Final   Coronavirus 229E NOT DETECTED NOT DETECTED Final    Comment: (NOTE) The Coronavirus on the Respiratory Panel, DOES NOT test for the novel  Coronavirus (2019 nCoV)    Coronavirus HKU1 NOT DETECTED NOT DETECTED Final   Coronavirus NL63 NOT DETECTED NOT DETECTED Final   Coronavirus OC43 NOT DETECTED NOT DETECTED Final   Metapneumovirus NOT DETECTED NOT DETECTED Final   Rhinovirus / Enterovirus NOT DETECTED NOT DETECTED Final   Influenza A NOT DETECTED NOT DETECTED Final   Influenza B NOT DETECTED NOT DETECTED Final   Parainfluenza Virus 1 NOT DETECTED NOT DETECTED Final   Parainfluenza Virus 2 NOT DETECTED NOT DETECTED Final   Parainfluenza Virus 3 NOT DETECTED NOT DETECTED Final   Parainfluenza Virus 4 NOT DETECTED NOT DETECTED Final   Respiratory Syncytial Virus NOT DETECTED NOT DETECTED Final   Bordetella  pertussis NOT DETECTED NOT DETECTED Final   Chlamydophila pneumoniae NOT DETECTED NOT DETECTED Final   Mycoplasma pneumoniae NOT DETECTED NOT DETECTED Final    Comment: Performed at [facility], [place]. [address], [place], [place] [address]         Radiology Studies: No results found.      Scheduled Meds: . azithromycin  500 mg Oral Daily  . bictegravir-emtricitabine-tenofovir AF  1 tablet Oral Daily  . citalopram  10 mg Oral Daily  . feeding supplement (ENSURE ENLIVE)  237 mL Oral TID BM  . folic acid  1 mg Oral Daily  . methadone  10 mg Oral BID  . multivitamin with minerals  1 tablet Oral Daily  . pantoprazole  40 mg Oral BID   Continuous Infusions:  LOS: 3 days    Time spent: 25 minutes    Dana Allan, MD  [facility] Pager #: [phone] 7PM-7AM contact night coverage as above

## 2019-02-11 LAB — CBC WITH DIFFERENTIAL/PLATELET
Band Neutrophils: 3 %
Basophils Absolute: 0 10*3/uL (ref 0.0–0.1)
Basophils Relative: 0 %
Blasts: 0 %
Eosinophils Absolute: 0.1 10*3/uL (ref 0.0–0.5)
Eosinophils Relative: 2 %
HCT: 25.9 % — ABNORMAL LOW (ref 36.0–46.0)
Hemoglobin: 8.4 g/dL — ABNORMAL LOW (ref 12.0–15.0)
Lymphocytes Relative: 44 %
Lymphs Abs: 1.1 10*3/uL (ref 0.7–4.0)
MCH: 25.2 pg — ABNORMAL LOW (ref 26.0–34.0)
MCHC: 32.4 g/dL (ref 30.0–36.0)
MCV: 77.8 fL — ABNORMAL LOW (ref 80.0–100.0)
Metamyelocytes Relative: 3 %
Monocytes Absolute: 0.3 10*3/uL (ref 0.1–1.0)
Monocytes Relative: 11 %
Myelocytes: 1 %
Neutro Abs: 1 10*3/uL — ABNORMAL LOW (ref 1.7–7.7)
Neutrophils Relative %: 36 %
Platelets: 69 10*3/uL — ABNORMAL LOW (ref 150–400)
Promyelocytes Relative: 0 %
RBC: 3.33 MIL/uL — ABNORMAL LOW (ref 3.87–5.11)
RDW: 20.9 % — ABNORMAL HIGH (ref 11.5–15.5)
WBC: 2.5 10*3/uL — ABNORMAL LOW (ref 4.0–10.5)
nRBC: 0 /100 WBC
nRBC: 2 % — ABNORMAL HIGH (ref 0.0–0.2)

## 2019-02-11 LAB — BASIC METABOLIC PANEL
Anion gap: 8 (ref 5–15)
BUN: 10 mg/dL (ref 6–20)
CO2: 20 mmol/L — ABNORMAL LOW (ref 22–32)
Calcium: 8.3 mg/dL — ABNORMAL LOW (ref 8.9–10.3)
Chloride: 102 mmol/L (ref 98–111)
Creatinine, Ser: 0.58 mg/dL (ref 0.44–1.00)
GFR calc Af Amer: >60 mL/min (ref >60)
GFR calc non Af Amer: >60 mL/min (ref >60)
Glucose, Bld: 108 mg/dL — ABNORMAL HIGH (ref 70–99)
Potassium: 4.1 mmol/L (ref 3.5–5.1)
Sodium: 130 mmol/L — ABNORMAL LOW (ref 135–145)

## 2019-02-11 MED ORDER — AZITHROMYCIN 500 MG PO TABS: 500.0000 mg | ORAL_TABLET | Freq: Every day | ORAL | 0 refills | Status: AC

## 2019-02-11 MED ORDER — ADULT MULTIVITAMIN W/MINERALS CH: 1.0000 | ORAL_TABLET | Freq: Every day | ORAL | 0 refills | Status: DC

## 2019-02-11 MED ORDER — SODIUM CHLORIDE 0.9 % IV BOLUS
500.0000 mL | Freq: Once | INTRAVENOUS | Status: AC
  Administered 2019-02-11: 09:00:00 500 mL via INTRAVENOUS

## 2019-02-11 MED ORDER — FOLIC ACID 1 MG PO TABS: 1.0000 mg | ORAL_TABLET | Freq: Every day | ORAL | 0 refills | Status: DC

## 2019-02-11 NOTE — Discharge Summary (Signed)
Physician Discharge Summary  Patient ID: ADRINNE Roman MRN: [id number] DOB/AGE: 1960/08/29 58 y.o.  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  Patient was admitted with headache and palpitations.  No associated fever or chills.  No cough or other URI symptoms reported.  However, patient endorsed loss of taste, shortness of breath, nausea vomiting.  CD4 count on 02/07/2019 was 150.  COVID-19 came back negative.  Respiratory panel by PCR came back negative.  Cryptococcal antigen came back negative.  Urine toxicology was positive for cocaine and tetrahydrocannabinol.  Chest x-ray revealed hypoventilation with bibasilar atelectasis.  Vascular ultrasound of the lower extremities came back negative.  CT head without contrast did not show any acute abnormalities.  Infectious disease input is highly appreciated.  Patient has improved, but continues to report significant nausea.  Patient is not keen on being discharged back home.  Patient has been cleared for discharge by the infectious disease team.    Nausea vomiting:  -Continue supportive management.   -Adequate hydration.   -Continue Zofran as needed.   -COVID-19 came back negative. -Normal lipase level. -Further management depend on hospital course. 02/09/2019: Continues to report nausea.  Continue current management.  Likely  discharge tomorrow. 02/10/2019: Resolved significantly.  Pursue disposition.  Abnormal chest x-ray:  -Concerns for possible atypical pneumonia -Patient is on azithromycin. -Repeat chest x-ray today revealed hypoventilation and bibasilar atelectasis.   -Continue to assess. -Infectious disease and PT is appreciated. -Respiratory viral panel came back negative. -COVID-19 test came back negative. 02/09/2019: Infectious disease has cleared patient for discharge from infectious disease point.  Likely discharge home tomorrow.  Anemia:  -Iron panel is suggestive of anemia of chronic inflammation. -Significantly elevated ferritin (689) -Hemoglobin was 6.7 g/dL yesterday, transfused with 1 unit of packed red blood cells, hemoglobin today is 8.2 g/dL today. -Continue to monitor clinically.    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   -Discontinue IV fluids. -Repeat urine sodium, serum and urine osmolality.  HIV: Check CD4 count is 150 Infectious disease is following patient.   Continue withBiktarvi.  Will defer management to infectious disease team.  AKI; Resolved.  Likely prerenal. Serum creatinine today 0.7.    UDS positive for cocaine: Counseled.    Chronic pain;  On methadone  Discontinued opiates.  Consults: ID  Significant Diagnostic Studies:  Discharge Exam: Blood pressure 95/66, pulse (!) 52, temperature 98.5 F (36.9 C), temperature source Oral, resp. rate 20, height 5\' 5"  (1.651 m), weight 60 kg, SpO2 100 %.   Disposition: Discharge disposition: 01-Home or Self Care  Discharge Instructions    Diet - low sodium heart healthy   Complete by: As directed    Increase activity slowly   Complete by: As directed      Allergies as of 02/11/2019      Reactions   Amoxicillin Itching   Penicillin G Itching   Did it  involve swelling of the face/tongue/throat, SOB, or low BP? No Did it involve sudden or severe rash/hives, skin peeling,  or any reaction on the inside of your mouth or nose? No Did you need to seek medical attention at a hospital or doctor's office? yes When did it last happen?recently If all above answers are "NO", may proceed with cephalosporin use.      Medication List    STOP taking these medications   gabapentin 300 MG capsule Commonly known as: Neurontin   lidocaine 5 % Commonly known as: Lidoderm   lisinopril-hydrochlorothiazide 10-12.5 MG tablet Commonly known as: ZESTORETIC   metoprolol tartrate 25 MG tablet Commonly known as: LOPRESSOR   oxyCODONE 15 MG immediate release tablet Commonly known as: ROXICODONE   oxyCODONE-acetaminophen 5-325 MG tablet Commonly known as: PERCOCET/ROXICET     TAKE these medications   azithromycin 500 MG tablet Commonly known as: ZITHROMAX Take 1 tablet (500 mg total) by mouth daily for 3 days.   Biktarvy 50-200-25 MG Tabs tablet Generic drug: bictegravir-emtricitabine-tenofovir AF Take 1 tablet by mouth daily.   citalopram 10 MG tablet Commonly known as: CELEXA TAKE 1 TABLET BY MOUTH EVERY DAY   feeding supplement (ENSURE ENLIVE) Liqd Take 237 mLs by mouth 2 (two) times daily between meals.   ferrous sulfate 325 (65 FE) MG tablet Take 1 tablet (325 mg total) by mouth 2 (two) times daily with a meal.   folic acid 1 MG tablet Commonly known as: FOLVITE Take 1 tablet (1 mg total) by mouth daily. Start taking on: February 12, 2019   methadone 10 MG tablet Commonly known as: DOLOPHINE Take 10 mg by mouth 2 (two) times daily.   multivitamin with minerals Tabs tablet Take 1 tablet by mouth daily.        SignedBonnell Public 02/11/2019, 1:51 PM

## 2019-02-11 NOTE — TOC Progression Note (Signed)
Transition of Care [facility]) - Progression Note    Patient Details  Name: Kristen Roman MRN: [id number] Date of Birth: 16-Feb-1961  Transition of Care [facility]) CM/SW Contact  Joaquin Courts, RN Phone Number: 02/11/2019, 9:59 AM  Clinical Narrative:    CM spoke with patient who reports that her niece was out of town yesterday and she had her house keys. States niece is back in town today and has house keys. However, patient reports her blood pressure is low this morning and states "they are giving me fluids". No CM needs identified   Expected Discharge Plan: Home/Self Care Barriers to Discharge: Continued Medical Work up  Expected Discharge Plan and Services Expected Discharge Plan: Home/Self Care   Discharge Planning Services: CM Consult   Living arrangements for the past 2 months: Apartment                                       Social Determinants of Health (SDOH) Interventions    Readmission Risk Interventions No flowsheet data found.

## 2019-02-11 NOTE — Progress Notes (Signed)
Subjective: Notified by nursing staff via secure chat message @6 :51 am of low bp 83/61. Patient asymptomatic  Objective: He is afebrile with blood pressure 83/61/ mm Hg and pulse rate 55 beats/min. Labs reviewed showed last hgb on 02/08/2019 was 8.2 up from 6.7 on 02/07/2019.  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 He was found to be anemic with hgb 6.7 s/p transfusion.  Plan - Check repeat CBC and BMP - NS Bolus 500cc over an hour - Continue current medical management per treatment team.    Rufina Falco, DNP, CCRN, FNP-C [facility] Nurse Practitioner Between 7am to 6pm - Pager - [phone]  [facility]  Office  [phone]

## 2019-02-11 NOTE — Progress Notes (Signed)
Patient discharged home, discharge instructions given and explained to patient, she verbalized understanding, patient denies any distress. No pressure injury noted, skin intact.  Patient needing transportation home, CSW notified about taxi Circle and given to taxi driver for patient transportation.

## 2019-02-11 NOTE — Progress Notes (Signed)
Patient without IV access, IV team notified.

## 2019-03-29 ENCOUNTER — Emergency Department (HOSPITAL_BASED_OUTPATIENT_CLINIC_OR_DEPARTMENT_OTHER): Payer: Medicaid Other

## 2019-03-29 ENCOUNTER — Other Ambulatory Visit: Payer: Self-pay

## 2019-03-29 ENCOUNTER — Inpatient Hospital Stay (HOSPITAL_BASED_OUTPATIENT_CLINIC_OR_DEPARTMENT_OTHER)
Admission: EM | Admit: 2019-03-29 | Discharge: 2019-04-25 | DRG: 811 | Disposition: A | Discharge status: Hospice | Payer: Medicaid Other | Attending: Internal Medicine | Admitting: Internal Medicine

## 2019-03-29 ENCOUNTER — Encounter (HOSPITAL_COMMUNITY): Payer: Self-pay | Admitting: Internal Medicine

## 2019-03-29 DIAGNOSIS — R195 Other fecal abnormalities: Secondary | ICD-10-CM | POA: Diagnosis present

## 2019-03-29 DIAGNOSIS — C7951 Secondary malignant neoplasm of bone: Secondary | ICD-10-CM | POA: Diagnosis present

## 2019-03-29 DIAGNOSIS — K5900 Constipation, unspecified: Secondary | ICD-10-CM | POA: Diagnosis not present

## 2019-03-29 DIAGNOSIS — D500 Iron deficiency anemia secondary to blood loss (chronic): Secondary | ICD-10-CM | POA: Diagnosis not present

## 2019-03-29 DIAGNOSIS — C7949 Secondary malignant neoplasm of other parts of nervous system: Secondary | ICD-10-CM | POA: Diagnosis present

## 2019-03-29 DIAGNOSIS — Z66 Do not resuscitate: Secondary | ICD-10-CM | POA: Diagnosis present

## 2019-03-29 DIAGNOSIS — L299 Pruritus, unspecified: Secondary | ICD-10-CM | POA: Diagnosis not present

## 2019-03-29 DIAGNOSIS — K921 Melena: Secondary | ICD-10-CM | POA: Diagnosis not present

## 2019-03-29 DIAGNOSIS — Y95 Nosocomial condition: Secondary | ICD-10-CM | POA: Diagnosis not present

## 2019-03-29 DIAGNOSIS — F17210 Nicotine dependence, cigarettes, uncomplicated: Secondary | ICD-10-CM | POA: Diagnosis present

## 2019-03-29 DIAGNOSIS — Z880 Allergy status to penicillin: Secondary | ICD-10-CM | POA: Diagnosis not present

## 2019-03-29 DIAGNOSIS — F319 Bipolar disorder, unspecified: Secondary | ICD-10-CM | POA: Diagnosis present

## 2019-03-29 DIAGNOSIS — A419 Sepsis, unspecified organism: Secondary | ICD-10-CM | POA: Diagnosis not present

## 2019-03-29 DIAGNOSIS — R4781 Slurred speech: Secondary | ICD-10-CM | POA: Diagnosis present

## 2019-03-29 DIAGNOSIS — Z20822 Contact with and (suspected) exposure to covid-19: Secondary | ICD-10-CM | POA: Diagnosis present

## 2019-03-29 DIAGNOSIS — J440 Chronic obstructive pulmonary disease with acute lower respiratory infection: Secondary | ICD-10-CM | POA: Diagnosis not present

## 2019-03-29 DIAGNOSIS — K922 Gastrointestinal hemorrhage, unspecified: Secondary | ICD-10-CM | POA: Diagnosis present

## 2019-03-29 DIAGNOSIS — Z515 Encounter for palliative care: Secondary | ICD-10-CM | POA: Diagnosis not present

## 2019-03-29 DIAGNOSIS — Z841 Family history of disorders of kidney and ureter: Secondary | ICD-10-CM

## 2019-03-29 DIAGNOSIS — D62 Acute posthemorrhagic anemia: Principal | ICD-10-CM | POA: Diagnosis present

## 2019-03-29 DIAGNOSIS — C07 Malignant neoplasm of parotid gland: Secondary | ICD-10-CM | POA: Diagnosis present

## 2019-03-29 DIAGNOSIS — J189 Pneumonia, unspecified organism: Secondary | ICD-10-CM | POA: Diagnosis not present

## 2019-03-29 DIAGNOSIS — R1310 Dysphagia, unspecified: Secondary | ICD-10-CM | POA: Diagnosis not present

## 2019-03-29 DIAGNOSIS — R29810 Facial weakness: Secondary | ICD-10-CM | POA: Diagnosis present

## 2019-03-29 DIAGNOSIS — D61818 Other pancytopenia: Secondary | ICD-10-CM | POA: Diagnosis not present

## 2019-03-29 DIAGNOSIS — I611 Nontraumatic intracerebral hemorrhage in hemisphere, cortical: Secondary | ICD-10-CM | POA: Diagnosis not present

## 2019-03-29 DIAGNOSIS — B20 Human immunodeficiency virus [HIV] disease: Secondary | ICD-10-CM | POA: Diagnosis present

## 2019-03-29 DIAGNOSIS — R509 Fever, unspecified: Secondary | ICD-10-CM

## 2019-03-29 DIAGNOSIS — E871 Hypo-osmolality and hyponatremia: Secondary | ICD-10-CM | POA: Diagnosis present

## 2019-03-29 DIAGNOSIS — G4700 Insomnia, unspecified: Secondary | ICD-10-CM | POA: Diagnosis not present

## 2019-03-29 DIAGNOSIS — R5081 Fever presenting with conditions classified elsewhere: Secondary | ICD-10-CM | POA: Diagnosis present

## 2019-03-29 DIAGNOSIS — D649 Anemia, unspecified: Secondary | ICD-10-CM | POA: Diagnosis not present

## 2019-03-29 DIAGNOSIS — D709 Neutropenia, unspecified: Secondary | ICD-10-CM | POA: Diagnosis present

## 2019-03-29 DIAGNOSIS — R040 Epistaxis: Secondary | ICD-10-CM | POA: Diagnosis not present

## 2019-03-29 DIAGNOSIS — Z6822 Body mass index (BMI) 22.0-22.9, adult: Secondary | ICD-10-CM

## 2019-03-29 DIAGNOSIS — D630 Anemia in neoplastic disease: Secondary | ICD-10-CM | POA: Diagnosis not present

## 2019-03-29 DIAGNOSIS — I119 Hypertensive heart disease without heart failure: Secondary | ICD-10-CM | POA: Diagnosis present

## 2019-03-29 DIAGNOSIS — Z9889 Other specified postprocedural states: Secondary | ICD-10-CM | POA: Diagnosis not present

## 2019-03-29 DIAGNOSIS — R531 Weakness: Secondary | ICD-10-CM

## 2019-03-29 DIAGNOSIS — H4920 Sixth [abducent] nerve palsy, unspecified eye: Secondary | ICD-10-CM | POA: Diagnosis present

## 2019-03-29 DIAGNOSIS — L89152 Pressure ulcer of sacral region, stage 2: Secondary | ICD-10-CM | POA: Diagnosis present

## 2019-03-29 DIAGNOSIS — G43909 Migraine, unspecified, not intractable, without status migrainosus: Secondary | ICD-10-CM | POA: Diagnosis present

## 2019-03-29 DIAGNOSIS — C7931 Secondary malignant neoplasm of brain: Secondary | ICD-10-CM | POA: Diagnosis present

## 2019-03-29 DIAGNOSIS — Z7189 Other specified counseling: Secondary | ICD-10-CM

## 2019-03-29 DIAGNOSIS — Z8673 Personal history of transient ischemic attack (TIA), and cerebral infarction without residual deficits: Secondary | ICD-10-CM

## 2019-03-29 DIAGNOSIS — R627 Adult failure to thrive: Secondary | ICD-10-CM | POA: Diagnosis present

## 2019-03-29 DIAGNOSIS — G893 Neoplasm related pain (acute) (chronic): Secondary | ICD-10-CM | POA: Diagnosis present

## 2019-03-29 DIAGNOSIS — D6959 Other secondary thrombocytopenia: Secondary | ICD-10-CM | POA: Diagnosis not present

## 2019-03-29 DIAGNOSIS — L8990 Pressure ulcer of unspecified site, unspecified stage: Secondary | ICD-10-CM | POA: Insufficient documentation

## 2019-03-29 LAB — BASIC METABOLIC PANEL
Anion gap: 10 (ref 5–15)
BUN: 13 mg/dL (ref 6–20)
CO2: 22 mmol/L (ref 22–32)
Calcium: 8.5 mg/dL — ABNORMAL LOW (ref 8.9–10.3)
Chloride: 99 mmol/L (ref 98–111)
Creatinine, Ser: 0.48 mg/dL (ref 0.44–1.00)
GFR calc Af Amer: >60 mL/min (ref >60)
GFR calc non Af Amer: >60 mL/min (ref >60)
Glucose, Bld: 85 mg/dL (ref 70–99)
Potassium: 3.9 mmol/L (ref 3.5–5.1)
Sodium: 131 mmol/L — ABNORMAL LOW (ref 135–145)

## 2019-03-29 LAB — OCCULT BLOOD X 1 CARD TO LAB, STOOL: Fecal Occult Bld: POSITIVE — AB

## 2019-03-29 LAB — PREPARE RBC (CROSSMATCH)

## 2019-03-29 LAB — PROTIME-INR
INR: 1.1 (ref 0.8–1.2)
Prothrombin Time: 13.6 seconds (ref 11.4–15.2)

## 2019-03-29 LAB — SARS CORONAVIRUS 2 (TAT 6-24 HRS): SARS Coronavirus 2: NEGATIVE

## 2019-03-29 MED ORDER — TRAZODONE HCL 50 MG PO TABS
50.0000 mg | ORAL_TABLET | Freq: Every evening | ORAL | Status: DC | PRN
  Administered 2019-03-29 – 2019-04-02 (×4): 50 mg via ORAL
  Filled 2019-03-29 (×4): qty 1

## 2019-03-29 MED ORDER — DIPHENHYDRAMINE HCL 50 MG/ML IJ SOLN
12.5000 mg | Freq: Once | INTRAMUSCULAR | Status: AC
  Administered 2019-03-29: 14:00:00 12.5 mg via INTRAVENOUS
  Filled 2019-03-29: qty 1

## 2019-03-29 MED ORDER — SODIUM CHLORIDE 0.9% FLUSH
3.0000 mL | Freq: Two times a day (BID) | INTRAVENOUS | Status: DC
  Administered 2019-03-29 – 2019-04-25 (×53): 3 mL via INTRAVENOUS

## 2019-03-29 MED ORDER — SENNOSIDES-DOCUSATE SODIUM 8.6-50 MG PO TABS
2.0000 | ORAL_TABLET | Freq: Two times a day (BID) | ORAL | Status: DC
  Administered 2019-03-30: 1 via ORAL
  Administered 2019-03-31 – 2019-04-25 (×22): 2 via ORAL
  Filled 2019-03-29 (×49): qty 2

## 2019-03-29 MED ORDER — PANTOPRAZOLE SODIUM 40 MG PO TBEC
40.0000 mg | DELAYED_RELEASE_TABLET | Freq: Every day | ORAL | Status: DC
  Administered 2019-03-30 – 2019-03-31 (×2): 40 mg via ORAL
  Filled 2019-03-29 (×2): qty 1

## 2019-03-29 MED ORDER — ONDANSETRON HCL 4 MG/2ML IJ SOLN
4.0000 mg | Freq: Four times a day (QID) | INTRAMUSCULAR | Status: DC | PRN
  Administered 2019-04-02 – 2019-04-04 (×2): 4 mg via INTRAVENOUS
  Filled 2019-03-29 (×3): qty 2

## 2019-03-29 MED ORDER — HYDROMORPHONE HCL 1 MG/ML IJ SOLN: 1.0000 mg | Freq: Once | INTRAMUSCULAR | Status: DC

## 2019-03-29 MED ORDER — ONDANSETRON HCL 4 MG PO TABS: 4.0000 mg | ORAL_TABLET | Freq: Four times a day (QID) | ORAL | Status: DC | PRN

## 2019-03-29 MED ORDER — HYDROCODONE-ACETAMINOPHEN 5-325 MG PO TABS
1.0000 | ORAL_TABLET | Freq: Once | ORAL | Status: AC
  Administered 2019-03-29: 1 via ORAL
  Filled 2019-03-29: qty 1

## 2019-03-29 MED ORDER — ACETAMINOPHEN 325 MG PO TABS
650.0000 mg | ORAL_TABLET | Freq: Four times a day (QID) | ORAL | Status: DC | PRN
  Administered 2019-03-30 – 2019-04-25 (×37): 650 mg via ORAL
  Filled 2019-03-29 (×42): qty 2

## 2019-03-29 MED ORDER — PROCHLORPERAZINE EDISYLATE 10 MG/2ML IJ SOLN
10.0000 mg | Freq: Once | INTRAMUSCULAR | Status: AC
  Administered 2019-03-29: 14:00:00 10 mg via INTRAVENOUS
  Filled 2019-03-29: qty 2

## 2019-03-29 MED ORDER — CITALOPRAM HYDROBROMIDE 20 MG PO TABS
10.0000 mg | ORAL_TABLET | Freq: Every day | ORAL | Status: DC
  Administered 2019-03-30 – 2019-04-25 (×25): 10 mg via ORAL
  Filled 2019-03-29 (×27): qty 1

## 2019-03-29 MED ORDER — ADULT MULTIVITAMIN W/MINERALS CH
1.0000 | ORAL_TABLET | Freq: Every day | ORAL | Status: DC
  Administered 2019-03-30 – 2019-04-25 (×25): 1 via ORAL
  Filled 2019-03-29 (×27): qty 1

## 2019-03-29 MED ORDER — ACETAMINOPHEN 650 MG RE SUPP
650.0000 mg | Freq: Four times a day (QID) | RECTAL | Status: DC | PRN
  Filled 2019-03-29: qty 1

## 2019-03-29 MED ORDER — POLYETHYLENE GLYCOL 3350 17 G PO PACK: 17.0000 g | PACK | Freq: Every day | ORAL | Status: DC | PRN

## 2019-03-29 MED ORDER — OXYCODONE HCL 5 MG PO TABS
10.0000 mg | ORAL_TABLET | ORAL | Status: DC | PRN
  Administered 2019-03-30 (×4): 10 mg via ORAL
  Administered 2019-03-31: 5 mg via ORAL
  Administered 2019-03-31 – 2019-04-01 (×2): 10 mg via ORAL
  Filled 2019-03-29 (×9): qty 2

## 2019-03-29 MED ORDER — FENTANYL 25 MCG/HR TD PT72
1.0000 | MEDICATED_PATCH | TRANSDERMAL | Status: DC
  Administered 2019-03-29 – 2019-04-01 (×2): 1 via TRANSDERMAL
  Filled 2019-03-29 (×8): qty 1

## 2019-03-29 MED ORDER — BICTEGRAVIR-EMTRICITAB-TENOFOV 50-200-25 MG PO TABS
1.0000 | ORAL_TABLET | Freq: Every day | ORAL | Status: DC
  Administered 2019-03-30 – 2019-04-25 (×25): 1 via ORAL
  Filled 2019-03-29 (×27): qty 1

## 2019-03-29 MED ORDER — MORPHINE SULFATE (PF) 4 MG/ML IV SOLN
4.0000 mg | Freq: Once | INTRAVENOUS | Status: AC
  Administered 2019-03-29: 4 mg via INTRAVENOUS
  Filled 2019-03-29: qty 1

## 2019-03-29 MED ORDER — FERROUS SULFATE 325 (65 FE) MG PO TABS
325.0000 mg | ORAL_TABLET | Freq: Two times a day (BID) | ORAL | Status: DC
  Administered 2019-03-30 – 2019-03-31 (×2): 325 mg via ORAL
  Filled 2019-03-29 (×3): qty 1

## 2019-03-29 MED ORDER — ENSURE ENLIVE PO LIQD: 237.0000 mL | Freq: Two times a day (BID) | ORAL | Status: DC

## 2019-03-29 MED ORDER — SODIUM CHLORIDE 0.9% IV SOLUTION: Freq: Once | INTRAVENOUS | Status: AC

## 2019-03-29 MED ORDER — FOLIC ACID 1 MG PO TABS
1.0000 mg | ORAL_TABLET | Freq: Every day | ORAL | Status: DC
  Administered 2019-03-30 – 2019-04-25 (×25): 1 mg via ORAL
  Filled 2019-03-29 (×27): qty 1

## 2019-03-29 MED ORDER — DEXAMETHASONE 4 MG PO TABS
4.0000 mg | ORAL_TABLET | Freq: Every morning | ORAL | Status: DC
  Administered 2019-03-30 – 2019-04-25 (×25): 4 mg via ORAL
  Filled 2019-03-29 (×27): qty 1

## 2019-03-29 MED ORDER — BISACODYL 10 MG RE SUPP: 10.0000 mg | Freq: Every day | RECTAL | Status: DC | PRN

## 2019-03-29 NOTE — ED Notes (Signed)
Pt assisted to bedside commode, unsteady gait, refuses ambulation trial.

## 2019-03-29 NOTE — ED Provider Notes (Signed)
Medical screening examination/treatment/procedure(s) were conducted as a shared visit with non-physician practitioner(s) and myself.  I personally evaluated the patient during the encounter.     Patient seen by me along with physician assistant.  Patient is requesting nursing home placement.  Little bit of confusion on the story.  Does appear she was being taken care of by hospice.  Whether it was a hospice center or whether she canceled her hospice nurse yesterday.  But now she is at home with no care.  Patient wants to be placed into a nursing facility.  Patient has a history of metastatic head neck cancer.  History of HIV.  History of polysubstance abuse.  None of the symptoms are particularly worse.  We have not seen her before she is cared for most entirely in the [facility].  We will CT her head get some basic labs.  Have gotten social worker involved.  They are recommending that she is going to probably need to be transferred in to have a physical therapy consult.  Little bit of a time crunch because neck will have to occur before for.  But we do not feel comfortable transferring without looking at her head CT.  Patient will probably end up boarding at either [facility] long or [facility] emergency department while they try to sort out placement.     Fredia Sorrow, MD 03/29/19 [phone]

## 2019-03-29 NOTE — ED Triage Notes (Signed)
Pt reports generalized pain, vision difficulty for 2 weeks, was seen by hospice nurse yesterday per patient, per ems pt ambulatory with steady gait around apartment, upon transport offers no other information, does not answer questions when asked. Only asks for warm blanket

## 2019-03-29 NOTE — Progress Notes (Addendum)
Patient is a 58 year old female with history of head/neck cancer(stage IV parotid adenocarcinoma), HIV, polysubstance abuse, bipolar disorder, migraine headache who presents to [place] requesting placement of a skilled nursing facility.  On presentation she was found to be  pancytopenic.  Hemoglobin was found to be 5.3.  FOBT was positive.  [facility] requested for admission for the evaluation of GI bleed, requirement of blood transfusion.  I have requested them to call GI consult.  Regarding her past medical history, as per the emergency physician giving signout, she is currently on hospice for her cancer but apparently signed off from the hospice center .She was brought to the emergency department from an apartment. We will admit her on telemetry bed at [facility]  Will notify GI when she presents here.  She needs transfusion with 2 units of PRBC.  She also might need platelet transfusion.  As per GI, she may undergo EGD/colonoscopy.  She will need physical therapy evaluation.  We will find out more information about her cancer status and treatment when she comes here.
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
## 2019-03-29 NOTE — Discharge Planning (Signed)
[facility] consulted regarding SNF placement from ED.  Pt will need PT consult and evaluation for SNF recommendation. EDCM will continue to follow for disposition needs.

## 2019-03-29 NOTE — ED Notes (Signed)
[facility] arrived to transport pt to [facility]. VSS. 

## 2019-03-29 NOTE — ED Notes (Signed)
ED Provider at bedside. 

## 2019-03-30 ENCOUNTER — Inpatient Hospital Stay (HOSPITAL_COMMUNITY): Payer: Medicaid Other

## 2019-03-30 DIAGNOSIS — F17210 Nicotine dependence, cigarettes, uncomplicated: Secondary | ICD-10-CM

## 2019-03-30 DIAGNOSIS — R509 Fever, unspecified: Secondary | ICD-10-CM

## 2019-03-30 DIAGNOSIS — F319 Bipolar disorder, unspecified: Secondary | ICD-10-CM

## 2019-03-30 DIAGNOSIS — D500 Iron deficiency anemia secondary to blood loss (chronic): Secondary | ICD-10-CM

## 2019-03-30 DIAGNOSIS — R29810 Facial weakness: Secondary | ICD-10-CM

## 2019-03-30 DIAGNOSIS — B20 Human immunodeficiency virus [HIV] disease: Secondary | ICD-10-CM

## 2019-03-30 DIAGNOSIS — R5081 Fever presenting with conditions classified elsewhere: Secondary | ICD-10-CM

## 2019-03-30 DIAGNOSIS — Z9889 Other specified postprocedural states: Secondary | ICD-10-CM

## 2019-03-30 DIAGNOSIS — C07 Malignant neoplasm of parotid gland: Secondary | ICD-10-CM

## 2019-03-30 DIAGNOSIS — K921 Melena: Secondary | ICD-10-CM

## 2019-03-30 DIAGNOSIS — D709 Neutropenia, unspecified: Secondary | ICD-10-CM

## 2019-03-30 DIAGNOSIS — D649 Anemia, unspecified: Secondary | ICD-10-CM

## 2019-03-30 DIAGNOSIS — C7931 Secondary malignant neoplasm of brain: Secondary | ICD-10-CM

## 2019-03-30 LAB — URINALYSIS, ROUTINE W REFLEX MICROSCOPIC
Bilirubin Urine: NEGATIVE
Glucose, UA: NEGATIVE mg/dL
Hgb urine dipstick: NEGATIVE
Ketones, ur: NEGATIVE mg/dL
Leukocytes,Ua: NEGATIVE
Nitrite: NEGATIVE
Protein, ur: NEGATIVE mg/dL
Specific Gravity, Urine: 1.013 (ref 1.005–1.030)
pH: 6 (ref 5.0–8.0)

## 2019-03-30 LAB — CBC
HCT: 18.5 % — ABNORMAL LOW (ref 36.0–46.0)
HCT: 14.8 % — ABNORMAL LOW (ref 36.0–46.0)
HCT: 21.4 % — ABNORMAL LOW (ref 36.0–46.0)
HCT: 19.5 % — ABNORMAL LOW (ref 36.0–46.0)
Hemoglobin: 5.4 g/dL — CL (ref 12.0–15.0)
Hemoglobin: 4.3 g/dL — CL (ref 12.0–15.0)
Hemoglobin: 6.6 g/dL — CL (ref 12.0–15.0)
Hemoglobin: 6 g/dL — CL (ref 12.0–15.0)
MCH: 24.7 pg — ABNORMAL LOW (ref 26.0–34.0)
MCH: 24.3 pg — ABNORMAL LOW (ref 26.0–34.0)
MCH: 26.6 pg (ref 26.0–34.0)
MCH: 26.3 pg (ref 26.0–34.0)
MCHC: 29.2 g/dL — ABNORMAL LOW (ref 30.0–36.0)
MCHC: 29.1 g/dL — ABNORMAL LOW (ref 30.0–36.0)
MCHC: 30.8 g/dL (ref 30.0–36.0)
MCHC: 30.8 g/dL (ref 30.0–36.0)
MCV: 84.5 fL (ref 80.0–100.0)
MCV: 83.6 fL (ref 80.0–100.0)
MCV: 86.3 fL (ref 80.0–100.0)
MCV: 85.5 fL (ref 80.0–100.0)
Platelets: 32 10*3/uL — ABNORMAL LOW (ref 150–400)
Platelets: 20 10*3/uL — CL (ref 150–400)
Platelets: 15 10*3/uL — CL (ref 150–400)
Platelets: 14 10*3/uL — CL (ref 150–400)
RBC: 2.19 MIL/uL — ABNORMAL LOW (ref 3.87–5.11)
RBC: 1.77 MIL/uL — ABNORMAL LOW (ref 3.87–5.11)
RBC: 2.48 MIL/uL — ABNORMAL LOW (ref 3.87–5.11)
RBC: 2.28 MIL/uL — ABNORMAL LOW (ref 3.87–5.11)
RDW: 21.3 % — ABNORMAL HIGH (ref 11.5–15.5)
RDW: 21 % — ABNORMAL HIGH (ref 11.5–15.5)
RDW: 18.6 % — ABNORMAL HIGH (ref 11.5–15.5)
RDW: 18.5 % — ABNORMAL HIGH (ref 11.5–15.5)
WBC: 3.4 10*3/uL — ABNORMAL LOW (ref 4.0–10.5)
WBC: 2.6 10*3/uL — ABNORMAL LOW (ref 4.0–10.5)
WBC: 3.3 10*3/uL — ABNORMAL LOW (ref 4.0–10.5)
WBC: 3.4 10*3/uL — ABNORMAL LOW (ref 4.0–10.5)
nRBC: 3.5 % — ABNORMAL HIGH (ref 0.0–0.2)
nRBC: 0.13 % (ref 0.0–0.2)
nRBC: 2.7 % — ABNORMAL HIGH (ref 0.0–0.2)
nRBC: 3.5 % — ABNORMAL HIGH (ref 0.0–0.2)

## 2019-03-30 LAB — BASIC METABOLIC PANEL
Anion gap: 9 (ref 5–15)
BUN: 14 mg/dL (ref 6–20)
CO2: 26 mmol/L (ref 22–32)
Calcium: 8.8 mg/dL — ABNORMAL LOW (ref 8.9–10.3)
Chloride: 99 mmol/L (ref 98–111)
Creatinine, Ser: 0.45 mg/dL (ref 0.44–1.00)
GFR calc Af Amer: >60 mL/min (ref >60)
GFR calc non Af Amer: >60 mL/min (ref >60)
Glucose, Bld: 88 mg/dL (ref 70–99)
Potassium: 4 mmol/L (ref 3.5–5.1)
Sodium: 134 mmol/L — ABNORMAL LOW (ref 135–145)

## 2019-03-30 LAB — LACTIC ACID, PLASMA: Lactic Acid, Venous: 0.9 mmol/L (ref 0.5–1.9)

## 2019-03-30 LAB — PROCALCITONIN: Procalcitonin: 0.23 ng/mL

## 2019-03-30 LAB — LACTATE DEHYDROGENASE: LDH: 976 U/L — ABNORMAL HIGH (ref 98–192)

## 2019-03-30 LAB — CRYPTOCOCCAL ANTIGEN: Crypto Ag: NEGATIVE

## 2019-03-30 LAB — FERRITIN: Ferritin: 784 ng/mL — ABNORMAL HIGH (ref 11–307)

## 2019-03-30 LAB — C-REACTIVE PROTEIN: CRP: 7.8 mg/dL — ABNORMAL HIGH (ref <1.0)

## 2019-03-30 MED ORDER — SODIUM CHLORIDE 0.9% IV SOLUTION: Freq: Once | INTRAVENOUS | Status: DC

## 2019-03-30 MED ORDER — SODIUM CHLORIDE 0.9 % IV SOLN
2.0000 g | Freq: Three times a day (TID) | INTRAVENOUS | Status: DC
  Filled 2019-03-30 (×3): qty 2

## 2019-03-30 MED ORDER — IOHEXOL 350 MG/ML SOLN
100.0000 mL | Freq: Once | INTRAVENOUS | Status: AC | PRN
  Administered 2019-03-30: 15:00:00 75 mL via INTRAVENOUS

## 2019-03-30 MED ORDER — VANCOMYCIN HCL 1250 MG/250ML IV SOLN
1250.0000 mg | INTRAVENOUS | Status: DC
  Administered 2019-03-31 – 2019-04-02 (×3): 1250 mg via INTRAVENOUS
  Filled 2019-03-30 (×3): qty 250

## 2019-03-30 MED ORDER — SODIUM CHLORIDE (PF) 0.9 % IJ SOLN
INTRAMUSCULAR | Status: AC
  Filled 2019-03-30: qty 50

## 2019-03-30 MED ORDER — VANCOMYCIN HCL 1250 MG/250ML IV SOLN
1250.0000 mg | Freq: Once | INTRAVENOUS | Status: AC
  Administered 2019-03-30: 1250 mg via INTRAVENOUS
  Filled 2019-03-30: qty 250

## 2019-03-30 MED ORDER — SODIUM CHLORIDE 0.9 % IV SOLN
2.0000 g | Freq: Three times a day (TID) | INTRAVENOUS | Status: DC
  Administered 2019-03-30 – 2019-04-02 (×10): 2 g via INTRAVENOUS
  Filled 2019-03-30 (×11): qty 2

## 2019-03-30 MED ORDER — SODIUM CHLORIDE 0.9 % IV SOLN
INTRAVENOUS | Status: DC | PRN
  Administered 2019-03-30 – 2019-04-03 (×2): 250 mL via INTRAVENOUS

## 2019-03-30 NOTE — Progress Notes (Addendum)
PROGRESS NOTE  Kristen Roman [id number] DOB: [birth year] DOA: 03/29/2019 PCP: [facility]  HPI/Recap of past 24 hours: HPI from Dr Mindi Junker Kristen Roman is a 58 y.o. 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 Of note, patient recently discontinued hospice, has poor insight to medical problems and a poor historian. Patient is a reluctant historian with unclear insight to her condition. Pt also reports intermittent blood in her stool over the last week, unable to give further info. In the ED, HD stable except for some mild tachycardia, lab work notable for pancytopenia with a significant drop in hemoglobin to 5.3, FOBT positive, CT head with no significant acute changes.  COVID-19 negative.  Patient admitted for further management.    Today, patient noted to be spiking temp, unable to give blood due to this.  Patient only complaint today was being very hungry, has been n.p.o. over midnight and wants to eat.  Still reports pain generally weak, denies any worsening headaches, shortness of breath, denies any chest pain, abdominal pain, nausea/vomiting.  Assessment/Plan: Active Problems:   Symptomatic anemia   Anemia  Acute on chronic blood loss anemia likely 2/2 GI bleed Hemoglobin 5.3 on admission, baseline around 8 FOBT positive Plan to transfuse 2 units PRBC, currently pending due to fever spikes Continue Protonix GI consulted, appreciate recs Daily CBC  Neutropenic fever with possible sepsis likely 2/2 HCAP, rule out PJP Vs bacteremia Vs cryptococcus Continues to have fever spikes, T-max 101.7, with leukopenia BC x2 pending LA 0.9 Procalcitonin 0.23 UA unremarkable Chest x-ray showed possible new opacity of the right perihilar region, further evaluation with a CT chest is  recommended to exclude underlying mass CTA chest pending ID consulted, spoke to Dr. Johnnye Sima, recommend induced sputum for PJP, cryptococcus antigen Continue IV vancomycin, aztreonam pending cultures  Chronic pancytopenia Likely related to HIV, as well as malignancy Daily CBC  HIV CD4 count 150 on 02/07/2019 ?? 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   Weight as of this encounter: 60 kg.     Code Status: DNR  Family Communication: None at bedside  Disposition Plan: To be determined   Consultants:  GI  ID  Procedures:  None  Antimicrobials:  Vancomycin  Aztreonam  DVT prophylaxis: SCDs   Objective: Vitals:   03/30/19 0327 03/30/19 0502 03/30/19 0909 03/30/19 1058  BP:  131/86  (!) 145/88  Pulse:  (!) 104  99  Resp:  18    Temp: (!) 100.8 F (38.2 C) 99.2 F (37.3 C) 99.7 F (37.6 C) (!) 100.4 F (38 C)  TempSrc:  Oral Oral Oral  SpO2:  92%    Weight:      Height:        Intake/Output Summary (Last 24 hours) at  03/30/2019 1130 Last data filed at 03/30/2019 0500 Gross per 24 hour  Intake --  Output 500 ml  Net -500 ml   Filed Weights   03/29/19 2100  Weight: 60 kg    Exam:  General: NAD, awake, alert, oriented, chronic right sided facial droop  Cardiovascular: S1, S2 present  Respiratory: CTAB  Abdomen: Soft, nontender, nondistended, bowel sounds  present  Musculoskeletal: No bilateral pedal edema noted  Skin: Normal  Psychiatry:  Poor insight/judgment, slightly agitated   Data Reviewed: CBC: Recent Labs  Lab 03/29/19 1247 03/29/19 2256  WBC 3.8* 3.4*  NEUTROABS 1.5*  --   HGB 5.3* 5.4*  HCT 17.6* 18.5*  MCV 82.2 84.5  PLT 23* 32*   Basic Metabolic Panel: Recent Labs  Lab 03/29/19 1247 03/30/19 0153  NA 131* 134*  K 3.9 4.0  CL 99 99  CO2 22 26  GLUCOSE 85 88  BUN 13 14  CREATININE 0.48 0.45  CALCIUM 8.5* 8.8*   GFR: Estimated Creatinine Clearance: 68.9 mL/min (by C-G formula based on SCr of 0.45 mg/dL). Liver Function Tests: No results for input(s): AST, ALT, ALKPHOS, BILITOT, PROT, ALBUMIN in the last 168 hours. No results for input(s): LIPASE, AMYLASE in the last 168 hours. No results for input(s): AMMONIA in the last 168 hours. Coagulation Profile: Recent Labs  Lab 03/29/19 2256  INR 1.1   Cardiac Enzymes: No results for input(s): CKTOTAL, CKMB, CKMBINDEX, TROPONINI in the last 168 hours. BNP (last 3 results) No results for input(s): PROBNP in the last 8760 hours. HbA1C: No results for input(s): HGBA1C in the last 72 hours. CBG: No results for input(s): GLUCAP in the last 168 hours. Lipid Profile: No results for input(s): CHOL, HDL, LDLCALC, TRIG, CHOLHDL, LDLDIRECT in the last 72 hours. Thyroid Function Tests: No results for input(s): TSH, T4TOTAL, FREET4, T3FREE, THYROIDAB in the last 72 hours. Anemia Panel: No results for input(s): VITAMINB12, FOLATE, FERRITIN, TIBC, IRON, RETICCTPCT in the last 72 hours. Urine analysis:    Component Value Date/Time   COLORURINE YELLOW 03/30/2019 0600   APPEARANCEUR CLEAR 03/30/2019 0600   LABSPEC 1.013 03/30/2019 0600   PHURINE 6.0 03/30/2019 0600   GLUCOSEU NEGATIVE 03/30/2019 0600   GLUCOSEU NEG mg/dL 12/29/2006 2001   HGBUR NEGATIVE 03/30/2019 0600   BILIRUBINUR NEGATIVE 03/30/2019 0600   KETONESUR NEGATIVE 03/30/2019 0600   PROTEINUR  NEGATIVE 03/30/2019 0600   UROBILINOGEN 1 12/29/2006 2001   NITRITE NEGATIVE 03/30/2019 0600   LEUKOCYTESUR NEGATIVE 03/30/2019 0600   Sepsis Labs: @LABRCNTIP (procalcitonin:4,lacticidven:4)  ) Recent Results (from the past 240 hour(s))  SARS CORONAVIRUS 2 (TAT 6-24 HRS) Nasopharyngeal Nasopharyngeal Swab     Status: None   Collection Time: 03/29/19  2:27 PM   Specimen: Nasopharyngeal Swab  Result Value Ref Range Status   SARS Coronavirus 2 NEGATIVE NEGATIVE Final    Comment: (NOTE) SARS-CoV-2 target nucleic acids are NOT DETECTED. The SARS-CoV-2 RNA is generally detectable in upper and lower respiratory specimens during the acute phase of infection. Negative results do not preclude SARS-CoV-2 infection, do not rule out co-infections with other pathogens, and should not be used as the sole basis for treatment or other patient management decisions. Negative results must be combined with clinical observations, patient history, and epidemiological information. The expected result is Negative. Fact Sheet for Patients: SugarRoll.be Fact Sheet for Healthcare Providers: https://www.woods-mathews.com/ This test is not yet approved or cleared by the [place] FDA and  has been authorized for detection and/or diagnosis of SARS-CoV-2 by FDA  under an Emergency Use Authorization (EUA). This EUA will remain  in effect (meaning this test can be used) for the duration of the COVID-19 declaration under Section 56 4(b)(1) of the Act, 21 U.S.C. section 360bbb-3(b)(1), unless the authorization is terminated or revoked sooner. 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 Primary of bone destruction at the skull base with a soft tissue component on recent MRI, suspicious for metastatic  disease. The patient also has dural enhancement and dural based mass in the left middle cranial fossa on the recent MRI. No reported symptoms at this time. EXAM: CT HEAD WITHOUT CONTRAST TECHNIQUE: Contiguous axial images were obtained from the base of the skull through the vertex without intravenous contrast. COMPARISON:  Brain MR dated 03/15/2019. Head CT dated 02/06/2019. FINDINGS: Brain: Stable mildly enlarged ventricles and cortical sulci. Oval area of low density in the left temporal lobe in the middle cranial fossa, corresponding to the mass seen on the recent MRI. Calcification in the pituitary gland with no pituitary mass seen on the recent MR. No intracranial hemorrhage or CT evidence of acute infarction. Vascular: No hyperdense vessel or unexpected calcification. Skull: Previously demonstrated permeative bone destruction at the skull base is again demonstrated, limited by motion artifacts on the current images. Again demonstrated is opacification of the mastoid air cells on the right. Sinuses/Orbits: The included portions are unremarkable, limited by motion artifacts. Right mastoid air cell opacification with mild progression since 02/06/2019. Other: None. IMPRESSION: 1. Limited examination due to motion artifacts with no acute abnormality seen. 2. Grossly stable left temporal lobe mass, compatible with the patient's known tumor. 3. Grossly stable permeative bone destruction at the skull base and mildly progressive opacification of the right mastoid air cells, suspicious for metastatic disease. 4. Pituitary gland calcifications, possibly due to the patient's previous radiation therapy. 5. Stable mild diffuse cerebral and cerebellar atrophy. Electronically Signed   By: Claudie Revering M.D.   On: 03/29/2019 14:40   DG Chest Port 1 View  Result Date: 03/30/2019 CLINICAL DATA:  Fever EXAM: PORTABLE CHEST 1 VIEW COMPARISON:  February 08, 2019, chest CT February 02, 2019 FINDINGS: The mediastinal contour and  cardiac silhouette are stable. The aorta is tortuous. There is interval developed opacity of the right perihilar region. There is no pulmonary edema or pleural effusion. Degenerative joint changes of bilateral shoulders are noted. IMPRESSION: New opacity of the right perihilar region. Further evaluation with a chest CT is recommended to exclude underlying mass. Electronically Signed   By: Abelardo Diesel M.D.   On: 03/30/2019 08:18    Scheduled Meds: . bictegravir-emtricitabine-tenofovir AF  1 tablet Oral Daily  . citalopram  10 mg Oral Daily  . dexamethasone  4 mg Oral q morning - 10a  . feeding supplement (ENSURE ENLIVE)  237 mL Oral BID BM  . fentaNYL  1 patch Transdermal Q72H  . ferrous sulfate  325 mg Oral BID WC  . folic acid  1 mg Oral Daily  . multivitamin with minerals  1 tablet Oral Daily  . pantoprazole  40 mg Oral Daily  . senna-docusate  2 tablet Oral BID  . sodium chloride flush  3 mL Intravenous Q12H    Continuous Infusions: . sodium chloride 250 mL (03/30/19 0856)  . aztreonam    . [START ON 03/31/2019]  vancomycin       LOS: 1 day     Alma Friendly, MD [facility]  If 7PM-7AM, please contact night-coverage [email or web address] 03/30/2019, 11:30 AM

## 2019-03-30 NOTE — Progress Notes (Signed)
Spoke with Dr, blood cultures to be ordered, once monitor temp, will update Dr once blood has started.

## 2019-03-30 NOTE — Consult Note (Signed)
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 anbx to treat for mild neutropenia (ANC 1500) fever (vanco/aztreonam) 2. Check CD4 3. Check serum crypto (her CD4 is a bit high for this).  4. Continue transfusions (fever predated) 5. Continue her ART 6. Supportive care 7. BCx pending 8. 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 Will follow with you  Thank you so much for this interesting consult,  Active Problems:   Symptomatic anemia   Anemia   . bictegravir-emtricitabine-tenofovir AF  1 tablet Oral Daily  . citalopram  10 mg Oral Daily  . dexamethasone  4 mg Oral q morning - 10a  . feeding supplement (ENSURE ENLIVE)  237 mL Oral BID BM  . fentaNYL  1 patch Transdermal Q72H  . ferrous sulfate  325 mg Oral BID WC  . folic acid  1 mg Oral Daily  . multivitamin with minerals  1 tablet Oral Daily  . pantoprazole  40 mg Oral Daily  . senna-docusate  2 tablet Oral BID  . sodium chloride (PF)      . sodium chloride flush  3 mL Intravenous Q12H    HPI: Kristen Roman is a 58 y.o. 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 In ED her hgb was noted to be 5.3 (8.4 1 month ago).  She has had fever in hospital (COVID - in ED).  She was seen by GI today for heme+ stool. Was deferred from endoscopy.  CT chest done today showed:  1. No CT evidence for acute pulmonary embolus. 2. Areas of subpleural bandlike consolidation in the lower lungs, right greater than left. This is probably mainly atelectatic although component of underlying infectious/inflammatory etiology not entirely  excluded. 3. Similar appearance widespread bony metastatic disease.   HIV 1 RNA Quant (copies/mL)  Date Value  02/08/2019 64,600  03/23/2013 53 (H)  01/02/2013 370 (H)   CD4 T Cell Abs (/uL)  Date Value  02/07/2019 150 (L)  01/02/2013 610  11/28/2012 460    Review of Systems: Review of Systems  Constitutional: Positive for fever.  Eyes: Negative for photophobia.  Respiratory: Negative for cough and shortness of breath.   Cardiovascular: Positive for leg swelling.  Gastrointestinal: Negative for constipation and diarrhea.  Genitourinary: Negative for dysuria.  Musculoskeletal: Negative for neck pain.  Please see HPI. All other systems reviewed and negative.   Past Medical History:  Diagnosis Date  . Cancer ([place])    "bone cancer"  . Hemorrhoid   . HIV disease ([place])   . Hypertension     Social History   Tobacco Use  . Smoking status: Current Every Day Smoker    Packs/day: 1.00    Years: 40.00    Pack years: 40.00    Types: Cigarettes    Start date: 04/06/1975  . Smokeless tobacco: Never Used  . Tobacco comment: pt. not ready to quit  Substance Use Topics  . Alcohol use: Yes    Alcohol/week: 100.0 standard drinks  Types: 100 Standard drinks or equivalent per week    Comment: pt. states she is an alcoholic - 6-pack a day if she has the money  . Drug use: Yes    Frequency: 3.0 times per week    Types: Marijuana    Family History  Problem Relation Age of Onset  . Kidney disease Mother      Medications:  Scheduled: . bictegravir-emtricitabine-tenofovir AF  1 tablet Oral Daily  . citalopram  10 mg Oral Daily  . dexamethasone  4 mg Oral q morning - 10a  . feeding supplement (ENSURE ENLIVE)  237 mL Oral BID BM  . fentaNYL  1 patch Transdermal Q72H  . ferrous sulfate  325 mg Oral BID WC  . folic acid  1 mg Oral Daily  . multivitamin with minerals  1 tablet Oral Daily  . pantoprazole  40 mg Oral Daily  . senna-docusate  2 tablet Oral BID  . sodium  chloride (PF)      . sodium chloride flush  3 mL Intravenous Q12H    Abtx:  Anti-infectives (From admission, onward)   Start     Dose/Rate Route Frequency Ordered Stop   03/31/19 0600  vancomycin (VANCOREADY) IVPB 1250 mg/250 mL     1,250 mg 166.7 mL/hr over 90 Minutes Intravenous Every 24 hours 03/30/19 0817     03/30/19 1200  aztreonam (AZACTAM) 2 g in sodium chloride 0.9 % 100 mL IVPB     2 g 200 mL/hr over 30 Minutes Intravenous Every 8 hours 03/30/19 1146     03/30/19 1000  bictegravir-emtricitabine-tenofovir AF (BIKTARVY) 50-200-25 MG per tablet 1 tablet     1 tablet Oral Daily 03/29/19 2225     03/30/19 0845  vancomycin (VANCOREADY) IVPB 1250 mg/250 mL     1,250 mg 166.7 mL/hr over 90 Minutes Intravenous  Once 03/30/19 0808 03/30/19 1027   03/30/19 0830  aztreonam (AZACTAM) 2 g in sodium chloride 0.9 % 100 mL IVPB  Status:  Discontinued     2 g 200 mL/hr over 30 Minutes Intravenous Every 8 hours 03/30/19 0810 03/30/19 1146        OBJECTIVE: Blood pressure (!) 145/77, pulse 97, temperature 98.7 F (37.1 C), temperature source Oral, resp. rate 18, height 5' 4.96" (1.65 m), weight 60 kg, SpO2 100 %.  Physical Exam Vitals reviewed.  Constitutional:      General: She is not in acute distress. HENT:     Mouth/Throat:     Mouth: Mucous membranes are moist.     Pharynx: No oropharyngeal exudate.  Eyes:     Extraocular Movements: Extraocular movements intact.     Pupils: Pupils are equal, round, and reactive to light.     Comments: No photophobia  Cardiovascular:     Rate and Rhythm: Normal rate and regular rhythm.  Pulmonary:     Effort: Pulmonary effort is normal.     Breath sounds: Normal breath sounds.  Abdominal:     General: Abdomen is flat. Bowel sounds are normal. There is no distension.     Palpations: Abdomen is soft.     Tenderness: There is no abdominal tenderness.  Musculoskeletal:     Cervical back: Normal range of motion and neck supple. No rigidity.       Right lower leg: Edema present.     Left lower leg: Edema present.     Comments: No cordis  Neurological:     Mental Status: She is alert.  Comments: R facial droop     Lab Results Results for orders placed or performed during the hospital encounter of 03/29/19 (from the past 48 hour(s))  Basic metabolic panel     Status: Abnormal   Collection Time: 03/29/19 12:47 PM  Result Value Ref Range   Sodium 131 (L) 135 - 145 mmol/L   Potassium 3.9 3.5 - 5.1 mmol/L   Chloride 99 98 - 111 mmol/L   CO2 22 22 - 32 mmol/L   Glucose, Bld 85 70 - 99 mg/dL   BUN 13 6 - 20 mg/dL   Creatinine, Ser 0.48 0.44 - 1.00 mg/dL   Calcium 8.5 (L) 8.9 - 10.3 mg/dL   GFR calc non Af Amer >60 >60 mL/min   GFR calc Af Amer >60 >60 mL/min   Anion gap 10 5 - 15    Comment: Performed at [facility], [address] [place], [place] [address]  CBC with Differential     Status: Abnormal   Collection Time: 03/29/19 12:47 PM  Result Value Ref Range   WBC 3.8 (L) 4.0 - 10.5 K/uL   RBC 2.14 (L) 3.87 - 5.11 MIL/uL   Hemoglobin 5.3 (LL) 12.0 - 15.0 g/dL    Comment: This critical result has verified and been called to [facility] COBLE RN by Bing Plume on 12 24 2020 at 1402, and has been read back.    HCT 17.6 (L) 36.0 - 46.0 %   MCV 82.2 80.0 - 100.0 fL   MCH 24.8 (L) 26.0 - 34.0 pg   MCHC 30.1 30.0 - 36.0 g/dL   RDW 21.2 (H) 11.5 - 15.5 %   Platelets 23 (LL) 150 - 400 K/uL    Comment: REPEATED TO VERIFY PLATELET COUNT CONFIRMED BY SMEAR PLATELETS APPEAR DECREASED SPECIMEN CHECKED FOR CLOTS    nRBC 4.5 (H) 0.0 - 0.2 %   Neutrophils Relative % 39 %   Neutro Abs 1.5 (L) 1.7 - 7.7 K/uL   Lymphocytes Relative 32 %   Lymphs Abs 1.2 0.7 - 4.0 K/uL   Monocytes Relative 16 %   Monocytes Absolute 0.6 0.1 - 1.0 K/uL   Eosinophils Relative 0 %   Eosinophils Absolute 0.0 0.0 - 0.5 K/uL   Basophils Relative 1 %   Basophils Absolute 0.0 0.0 - 0.1 K/uL   Immature Granulocytes 12 %   Abs Immature  Granulocytes 0.47 (H) 0.00 - 0.07 K/uL   Abnormal Lymphocytes Present PRESENT    Tear Drop Cells PRESENT    Polychromasia PRESENT    Spherocytes PRESENT    Ovalocytes PRESENT    Stomatocytes PRESENT     Comment: Performed at [facility], [address] [place], [place] [address]  SARS CORONAVIRUS 2 (TAT 6-24 HRS) Nasopharyngeal Nasopharyngeal Swab     Status: None   Collection Time: 03/29/19  2:27 PM   Specimen: Nasopharyngeal Swab  Result Value Ref Range   SARS Coronavirus 2 NEGATIVE NEGATIVE    Comment: (NOTE) SARS-CoV-2 target nucleic acids are NOT DETECTED. The SARS-CoV-2 RNA is generally detectable in upper and lower respiratory specimens during the acute phase of infection. Negative results do not preclude SARS-CoV-2 infection, do not rule out co-infections with other pathogens, and should not be used as the sole basis for treatment or other patient management decisions. Negative results must be combined with clinical observations, patient history, and epidemiological information. The expected result is Negative. Fact Sheet for Patients: SugarRoll.be Fact Sheet for Healthcare Providers:  https://www.woods-mathews.com/ This test is not yet approved or cleared by the [place] and  has been authorized for detection and/or diagnosis of SARS-CoV-2 by FDA under an Emergency Use Authorization (EUA). This EUA will remain  in effect (meaning this test can be used) for the duration of the COVID-19 declaration under Section 56 4(b)(1) of the Act, 21 U.S.C. section 360bbb-3(b)(1), unless the authorization is terminated or revoked sooner. Performed at [facility], [place] [address] [place], [place] [address]   Occult blood card to lab, stool     Status: Abnormal   Collection Time: 03/29/19  2:30 PM  Result Value Ref Range   Fecal Occult Bld POSITIVE (A) NEGATIVE    Comment: Performed at [facility], [address] [place], [place] [address]  CBC     Status: Abnormal   Collection Time: 03/29/19 10:56 PM  Result Value Ref Range   WBC 3.4 (L) 4.0 - 10.5 K/uL   RBC 2.19 (L) 3.87 - 5.11 MIL/uL   Hemoglobin 5.4 (LL) 12.0 - 15.0 g/dL    Comment: This critical result has verified and been called to [facility] by POTEAT,SHANNON on 12 25 2020 at [place], and has been read back. CRITICAL RESULT VERIFIED   HCT 18.5 (L) 36.0 - 46.0 %   MCV 84.5 80.0 - 100.0 fL   MCH 24.7 (L) 26.0 - 34.0 pg   MCHC 29.2 (L) 30.0 - 36.0 g/dL   RDW 21.3 (H) 11.5 - 15.5 %   Platelets 32 (L) 150 - 400 K/uL    Comment: Immature Platelet Fraction may be clinically indicated, consider ordering this additional test [id number]    nRBC 3.5 (H) 0.0 - 0.2 %    Comment: Performed at [facility], [place] [address], [place], [place] [address]  Protime-INR     Status: None   Collection Time: 03/29/19 10:56 PM  Result Value Ref Range   Prothrombin Time 13.6 11.4 - 15.2 seconds   INR 1.1 0.8 - 1.2    Comment: (NOTE) INR goal varies based on device and disease states. Performed at [facility], [place] [address], [place], [place] [address]   Type and screen [place]     Status: None (Preliminary result)   Collection Time: 03/29/19 10:56 PM  Result Value Ref Range   ABO/RH(D) O POS    Antibody Screen NEG    Sample Expiration 04/01/2019,2359    Unit Number [id number]    Blood Component Type RBC CPDA1, LR    Unit division 00    Status of Unit ISSUED    Transfusion Status OK TO TRANSFUSE    Crossmatch Result Compatible    Unit Number [id number]    Blood Component Type RBC CPDA1, LR    Unit division 00    Status of Unit ISSUED    Transfusion Status OK TO TRANSFUSE    Crossmatch Result      Compatible Performed at [facility], [place] [address] [place], [place] [address]   Prepare RBC     Status: None   Collection Time: 03/29/19 10:56 PM  Result  Value Ref Range   Order Confirmation      ORDER PROCESSED BY BLOOD BANK Performed at [facility] [address], [place], [place] [id number]   Basic metabolic panel     Status: Abnormal   Collection Time: 03/30/19  1:53 AM  Result Value Ref Range  Sodium 134 (L) 135 - 145 mmol/L   Potassium 4.0 3.5 - 5.1 mmol/L   Chloride 99 98 - 111 mmol/L   CO2 26 22 - 32 mmol/L   Glucose, Bld 88 70 - 99 mg/dL   BUN 14 6 - 20 mg/dL   Creatinine, Ser 0.45 0.44 - 1.00 mg/dL   Calcium 8.8 (L) 8.9 - 10.3 mg/dL   GFR calc non Af Amer >60 >60 mL/min   GFR calc Af Amer >60 >60 mL/min   Anion gap 9 5 - 15    Comment: Performed at [facility], [place] [address] [place] [address] [place], [place] [address]  Lactate dehydrogenase     Status: Abnormal   Collection Time: 03/30/19  2:30 AM  Result Value Ref Range   LDH 976 (H) 98 - 192 U/L    Comment: Performed at [facility], [place] [address], [place], [place] [address]  Procalcitonin - Baseline     Status: None   Collection Time: 03/30/19  2:30 AM  Result Value Ref Range   Procalcitonin 0.23 ng/mL    Comment:        Interpretation: PCT (Procalcitonin) <= 0.5 ng/mL: Systemic infection (sepsis) is not likely. Local bacterial infection is possible. (NOTE)       Sepsis PCT Algorithm           Lower Respiratory Tract                                      Infection PCT Algorithm    ----------------------------     ----------------------------         PCT < 0.25 ng/mL                PCT < 0.10 ng/mL         Strongly encourage             Strongly discourage   discontinuation of antibiotics    initiation of antibiotics    ----------------------------     -----------------------------       PCT 0.25 - 0.50 ng/mL            PCT 0.10 - 0.25 ng/mL               OR       >80% decrease in PCT            Discourage initiation of                                            antibiotics      Encourage discontinuation            of antibiotics    ----------------------------     -----------------------------         PCT >= 0.50 ng/mL              PCT 0.26 - 0.50 ng/mL               AND        <80% decrease in PCT             Encourage initiation of  antibiotics       Encourage continuation           of antibiotics    ----------------------------     -----------------------------        PCT >= 0.50 ng/mL                  PCT > 0.50 ng/mL               AND         increase in PCT                  Strongly encourage                                      initiation of antibiotics    Strongly encourage escalation           of antibiotics                                     -----------------------------                                           PCT <= 0.25 ng/mL                                                 OR                                        > 80% decrease in PCT                                     Discontinue / Do not initiate                                             antibiotics Performed at [place] [address], [place], [place] [address]   Lactic acid, plasma     Status: None   Collection Time: 03/30/19  4:45 AM  Result Value Ref Range   Lactic Acid, Venous 0.9 0.5 - 1.9 mmol/L    Comment: Performed at [facility], [place] [address], [place], [place] [address]  Urinalysis, Routine w reflex microscopic     Status: None   Collection Time: 03/30/19  6:00 AM  Result Value Ref Range   Color, Urine YELLOW YELLOW   APPearance CLEAR CLEAR   Specific Gravity, Urine 1.013 1.005 - 1.030   pH 6.0 5.0 - 8.0   Glucose, UA NEGATIVE NEGATIVE mg/dL   Hgb urine dipstick NEGATIVE NEGATIVE   Bilirubin Urine NEGATIVE NEGATIVE   Ketones, ur NEGATIVE NEGATIVE mg/dL   Protein, ur NEGATIVE NEGATIVE mg/dL   Nitrite NEGATIVE NEGATIVE   Leukocytes,Ua NEGATIVE NEGATIVE    Comment: Performed at [facility]  [address] [place], [place] [address]  C-reactive protein     Status: Abnormal   Collection Time: 03/30/19 10:55 AM  Result Value Ref Range   CRP 7.8 (H) <1.0 mg/dL    Comment: Performed at [facility], [place] [address] [place], [place] [address]  Ferritin     Status: Abnormal   Collection Time: 03/30/19 10:55 AM  Result Value Ref Range   Ferritin 784 (H) 11 - 307 ng/mL    Comment: Performed at [facility], [place] [address] [place], [place] [address]      Component Value Date/Time   SDES RECTAL SWAB 08/04/2011 2056   SPECREQUEST Immunocompromised 08/04/2011 2056   CULT Herpes Simplex Type 2 detected. 08/04/2011 2056   REPTSTATUS 08/06/2011 FINAL 08/04/2011 2056   CT Head Wo Contrast  Result Date: 03/29/2019 CLINICAL DATA:  History of radiation therapy for a right parotid tumor. Primary of bone destruction at the skull base with a soft tissue component on recent MRI, suspicious for metastatic disease. The patient also has dural enhancement and dural based mass in the left middle cranial fossa on the recent MRI. No reported symptoms at this time. EXAM: CT HEAD WITHOUT CONTRAST TECHNIQUE: Contiguous axial images were obtained from the base of the skull through the vertex without intravenous contrast. COMPARISON:  Brain MR dated 03/15/2019. Head CT dated 02/06/2019. FINDINGS: Brain: Stable mildly enlarged ventricles and cortical sulci. Oval area of low density in the left temporal lobe in the middle cranial fossa, corresponding to the mass seen on the recent MRI. Calcification in the pituitary gland with no pituitary mass seen on the recent MR. No intracranial hemorrhage or CT evidence of acute infarction. Vascular: No hyperdense vessel or unexpected calcification. Skull: Previously demonstrated permeative bone destruction at the skull base is again demonstrated, limited by motion artifacts on the current images. Again demonstrated is opacification of the mastoid  air cells on the right. Sinuses/Orbits: The included portions are unremarkable, limited by motion artifacts. Right mastoid air cell opacification with mild progression since 02/06/2019. Other: None. IMPRESSION: 1. Limited examination due to motion artifacts with no acute abnormality seen. 2. Grossly stable left temporal lobe mass, compatible with the patient's known tumor. 3. Grossly stable permeative bone destruction at the skull base and mildly progressive opacification of the right mastoid air cells, suspicious for metastatic disease. 4. Pituitary gland calcifications, possibly due to the patient's previous radiation therapy. 5. Stable mild diffuse cerebral and cerebellar atrophy. Electronically Signed   By: Claudie Revering M.D.   On: 03/29/2019 14:40   CT ANGIO CHEST PE W OR WO CONTRAST  Result Date: 03/30/2019 CLINICAL DATA:  Shortness of breath. Evaluate for pulmonary embolus. EXAM: CT ANGIOGRAPHY CHEST WITH CONTRAST TECHNIQUE: Multidetector CT imaging of the chest was performed using the standard protocol during bolus administration of intravenous contrast. Multiplanar CT image reconstructions and MIPs were obtained to evaluate the vascular anatomy. CONTRAST:  43mL OMNIPAQUE IOHEXOL 350 MG/ML SOLN COMPARISON:  02/03/2019 FINDINGS: Cardiovascular: Heart is enlarged. Coronary artery calcification is evident. Atherosclerotic calcification is noted in the wall of the thoracic aorta. No filling defect within the opacified pulmonary arteries to suggest the presence of an acute pulmonary embolus Mediastinum/Nodes: No mediastinal lymphadenopathy. There is no hilar lymphadenopathy. The esophagus has normal imaging features. There is no axillary lymphadenopathy. Lungs/Pleura: Bandlike areas of subpleural opacity are seen in the lower lungs bilaterally, right greater than left. This is likely atelectatic although component of infectious/inflammatory etiology not excluded. No pneumothorax or  pleural effusion. Upper  Abdomen: Unremarkable. Musculoskeletal: The widespread bony metastatic disease is similar in the interval. Review of the MIP images confirms the above findings. IMPRESSION: 1. No CT evidence for acute pulmonary embolus. 2. Areas of subpleural bandlike consolidation in the lower lungs, right greater than left. This is probably mainly atelectatic although component of underlying infectious/inflammatory etiology not entirely excluded. 3. Similar appearance widespread bony metastatic disease. Electronically Signed   By: Misty Stanley M.D.   On: 03/30/2019 14:51   DG Chest Port 1 View  Result Date: 03/30/2019 CLINICAL DATA:  Fever EXAM: PORTABLE CHEST 1 VIEW COMPARISON:  February 08, 2019, chest CT February 02, 2019 FINDINGS: The mediastinal contour and cardiac silhouette are stable. The aorta is tortuous. There is interval developed opacity of the right perihilar region. There is no pulmonary edema or pleural effusion. Degenerative joint changes of bilateral shoulders are noted. IMPRESSION: New opacity of the right perihilar region. Further evaluation with a chest CT is recommended to exclude underlying mass. Electronically Signed   By: Abelardo Diesel M.D.   On: 03/30/2019 08:18   Recent Results (from the past 240 hour(s))  SARS CORONAVIRUS 2 (TAT 6-24 HRS) Nasopharyngeal Nasopharyngeal Swab     Status: None   Collection Time: 03/29/19  2:27 PM   Specimen: Nasopharyngeal Swab  Result Value Ref Range Status   SARS Coronavirus 2 NEGATIVE NEGATIVE Final    Comment: (NOTE) SARS-CoV-2 target nucleic acids are NOT DETECTED. The SARS-CoV-2 RNA is generally detectable in upper and lower respiratory specimens during the acute phase of infection. Negative results do not preclude SARS-CoV-2 infection, do not rule out co-infections with other pathogens, and should not be used as the sole basis for treatment or other patient management decisions. Negative results must be combined with clinical observations, patient  history, and epidemiological information. The expected result is Negative. Fact Sheet for Patients: SugarRoll.be Fact Sheet for Healthcare Providers: https://www.woods-mathews.com/ This test is not yet approved or cleared by the [place] FDA and  has been authorized for detection and/or diagnosis of SARS-CoV-2 by FDA under an Emergency Use Authorization (EUA). This EUA will remain  in effect (meaning this test can be used) for the duration of the COVID-19 declaration under Section 56 4(b)(1) of the Act, 21 U.S.C. section 360bbb-3(b)(1), unless the authorization is terminated or revoked sooner. Performed at [facility], [place] [address], [place], [place] [address]     Microbiology: Recent Results (from the past 240 hour(s))  SARS CORONAVIRUS 2 (TAT 6-24 HRS) Nasopharyngeal Nasopharyngeal Swab     Status: None   Collection Time: 03/29/19  2:27 PM   Specimen: Nasopharyngeal Swab  Result Value Ref Range Status   SARS Coronavirus 2 NEGATIVE NEGATIVE Final    Comment: (NOTE) SARS-CoV-2 target nucleic acids are NOT DETECTED. The SARS-CoV-2 RNA is generally detectable in upper and lower respiratory specimens during the acute phase of infection. Negative results do not preclude SARS-CoV-2 infection, do not rule out co-infections with other pathogens, and should not be used as the sole basis for treatment or other patient management decisions. Negative results must be combined with clinical observations, patient history, and epidemiological information. The expected result is Negative. Fact Sheet for Patients: SugarRoll.be Fact Sheet for Healthcare Providers: https://www.woods-mathews.com/ This test is not yet approved or cleared by the [place] FDA and  has been authorized for detection and/or diagnosis of SARS-CoV-2 by FDA under an Emergency Use Authorization (EUA). This EUA will remain    in effect (  meaning this test can be used) for the duration of the COVID-19 declaration under Section 56 4(b)(1) of the Act, 21 U.S.C. section 360bbb-3(b)(1), unless the authorization is terminated or revoked sooner. Performed at [facility], [place] [address] [place], [place] [address]     Radiographs and labs were personally reviewed by me.   Bobby Rumpf, MD [facility] for Infectious Disease [facility] [phone] 03/30/2019, 4:25 PM

## 2019-03-30 NOTE — Progress Notes (Signed)
Pt a/o x 4, transferred from standing ER, Pts hgb is 5.3, will receive 2 units of blood once ordered.  Occult stool positive for blood, platelets 23, Dr aware.

## 2019-03-30 NOTE — Progress Notes (Signed)
PT Cancellation Note  Patient Details Name: Kristen Roman MRN: [id number] DOB: 05/09/1960   Cancelled Treatment:    Reason Eval/Treat Not Completed: Medical issues which prohibited therapy  Pt's hgb is 5.4 and unable to get PRBC due to spiking temperature.  Unstable for PT, will f/u as able.  [name], PT Acute Rehab Services Pager [phone] [name] [phone]    [name] 03/30/2019, 2:01 PM

## 2019-03-30 NOTE — Progress Notes (Addendum)
CRITICAL VALUE ALERT  Critical Value:  Hemoglobin- 4.3  Date & Time Notied:  03/30/19 1835  Provider Notified: Horris Latino, MD  Orders Received/Actions taken: Proceed with transfusion of second unit of PRBC's

## 2019-03-30 NOTE — Progress Notes (Signed)
RBC's to be started, temp 101.6, returned blood to blood bank within 30 minutes, per charge nurse,  notified Dr, gave tylenol,

## 2019-03-30 NOTE — Progress Notes (Signed)
Pt spiked temp of 101.6 before blood transfusion for Hgb of 5.4, and 101.7 2 hours after tylenol was given.  Blood sent back to blood bank and blood culture and stat labs ordered at 02:47.  No results on labs at 06:15, transfusion still not started pending temp stabilization.

## 2019-03-30 NOTE — Progress Notes (Signed)
CRITICAL VALUE ALERT  Critical Value:  Platelets- 20  Date & Time Notied:  03/30/2019 [place]  Provider Notified: Horris Latino, MD   Orders Received/Actions taken: no new orders at this time

## 2019-03-30 NOTE — Consult Note (Signed)
Consultation  Referring Provider: No ref. provider found Primary Care Physician:  Draper Primary Gastroenterologist: Althia Forts  Reason for Consultation: Heme positive stools    ASSESSMENT    #1.  Heme positive stools.  No overt GI bleeding. #2.  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 #3.  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  Patient was under hospice care. #4.  H/O polysubstance abuse, HIV, chronic migraines,   PLAN   -I have reviewed previous records.  Would recommend palliative care.  Not a candidate for any endoscopic procedures.  At times, GI bleed could be a terminal event. -Symptomatic management.  -Trend Hb/Hct and transfuse as needed. -Agree with IV Protonix. -Pain management.        HPI: Kristen Roman is a 58 y.o. female  Very unfortunate With stage IV parotid adenocarcinoma with brain mets, polysubstance abuse, HIV Has been on hospice care at home She cannot take care of herself at home and wanted to be SNF (would [facility] in [facility]). "Had to revoke hospice" Presented to ED at [place] to have hemoglobin of 5.3. Heme positive brown stools.  Her baseline hemoglobin is 8 Is also being evaluated for neutropenic sepsis. No abdominal pain.  No melena.  Denies nonsteroidal use. Has chronic neck, back pain including headache.  Past Medical History:  Diagnosis Date  . Cancer ([place])    "bone cancer"  . Hemorrhoid   . HIV disease ([place])   . Hypertension     Past Surgical History:  Procedure Laterality Date  . ABDOMINAL HYSTERECTOMY    . CESAREAN SECTION      Prior to Admission medications   Medication Sig Start Date End Date Taking? Authorizing Provider  ACETAMINOPHEN EXTRA STRENGTH 500 MG tablet Take 500 mg by mouth every 4 (four) hours as needed for fever or pain. 03/12/19  Yes [provider]    bictegravir-emtricitabine-tenofovir AF (BIKTARVY) 50-200-25 MG TABS tablet Take 1 tablet by mouth daily.    Yes [provider]  dexamethasone (DECADRON) 4 MG tablet Take 4 mg by mouth every morning. 03/12/19  Yes [provider]  ferrous sulfate 325 (65 FE) MG tablet Take 1 tablet (325 mg total) by mouth 2 (two) times daily with a meal. 02/03/19  Yes Palumbo, April, MD  folic acid (FOLVITE) 1 MG tablet Take 1 tablet (1 mg total) by mouth daily. 02/12/19  Yes Bonnell Public, MD  Multiple Vitamin (MULTIVITAMIN WITH MINERALS) TABS tablet Take 1 tablet by mouth daily. 02/11/19  Yes Dana Allan I, MD  omeprazole (PRILOSEC) 20 MG capsule Take 20 mg by mouth every morning. 03/12/19  Yes [provider]  Oxycodone HCl 10 MG TABS Take 10-15 mg by mouth every 4 (four) hours as needed for pain. 03/27/19  Yes [provider]  OXYCONTIN 15 MG 12 hr tablet Take 15 mg by mouth 2 (two) times daily. 03/27/19  Yes [provider]  SENNA-S 8.6-50 MG tablet Take 2 tablets by mouth 2 (two) times daily. 03/20/19  Yes [provider]  citalopram (CELEXA) 10 MG tablet TAKE 1 TABLET BY MOUTH EVERY DAY Patient taking differently: Take 10 mg by mouth daily.  02/21/14   Carlyle Basques, MD  feeding supplement, ENSURE ENLIVE, (ENSURE ENLIVE) LIQD Take 237 mLs by mouth 2 (two) times daily between meals. Patient not taking: Reported on 12/11/2018 11/09/18   Swayze, Ava, DO  methadone (  DOLOPHINE) 10 MG tablet Take 10 mg by mouth 2 (two) times daily. 01/15/19   [provider]    Current Facility-Administered Medications  Medication Dose Route Frequency Provider Last Rate Last Admin  . 0.9 %  sodium chloride infusion   Intravenous PRN Alma Friendly, MD 10 mL/hr at 03/30/19 0856 250 mL at 03/30/19 0856  . acetaminophen (TYLENOL) tablet 650 mg  650 mg Oral Q6H PRN Margart Sickles, DO   650 mg at 03/30/19 0901   Or  . acetaminophen (TYLENOL) suppository  650 mg  650 mg Rectal Q6H PRN Clinton Sawyer A, DO      . aztreonam (AZACTAM) 2 g in sodium chloride 0.9 % 100 mL IVPB  2 g Intravenous Q8H Alma Friendly, MD 200 mL/hr at 03/30/19 1156 2 g at 03/30/19 1156  . bictegravir-emtricitabine-tenofovir AF (BIKTARVY) 50-200-25 MG per tablet 1 tablet  1 tablet Oral Daily Clinton Sawyer A, DO   1 tablet at 03/30/19 [id number]  . bisacodyl (DULCOLAX) suppository 10 mg  10 mg Rectal Daily PRN Margart Sickles, DO      . citalopram (CELEXA) tablet 10 mg  10 mg Oral Daily Clinton Sawyer A, DO   10 mg at 03/30/19 [id number]  . dexamethasone (DECADRON) tablet 4 mg  4 mg Oral q morning - 10a Clinton Sawyer A, DO   4 mg at 03/30/19 [id number]  . feeding supplement (ENSURE ENLIVE) (ENSURE ENLIVE) liquid 237 mL  237 mL Oral BID BM Clinton Sawyer A, DO      . fentaNYL (DURAGESIC) 25 MCG/HR 1 patch  1 patch Transdermal Q72H Clinton Sawyer A, DO   1 patch at 03/29/19 2301  . ferrous sulfate tablet 325 mg  325 mg Oral BID WC Clinton Sawyer A, DO      . folic acid (FOLVITE) tablet 1 mg  1 mg Oral Daily Clinton Sawyer A, DO   1 mg at 03/30/19 [id number]  . multivitamin with minerals tablet 1 tablet  1 tablet Oral Daily Clinton Sawyer A, DO   1 tablet at 03/30/19 [id number]  . ondansetron (ZOFRAN) tablet 4 mg  4 mg Oral Q6H PRN Clinton Sawyer A, DO       Or  . ondansetron (ZOFRAN) injection 4 mg  4 mg Intravenous Q6H PRN Clinton Sawyer A, DO      . oxyCODONE (Oxy IR/ROXICODONE) immediate release tablet 10 mg  10 mg Oral Q4H PRN Clinton Sawyer A, DO   10 mg at 03/30/19 1148  . pantoprazole (PROTONIX) EC tablet 40 mg  40 mg Oral Daily Clinton Sawyer A, DO   40 mg at 03/30/19 [id number]  . polyethylene glycol (MIRALAX / GLYCOLAX) packet 17 g  17 g Oral Daily PRN Clinton Sawyer A, DO      . senna-docusate (Senokot-S) tablet 2 tablet  2 tablet Oral BID Clinton Sawyer A, DO      . sodium chloride flush (NS) 0.9 % injection 3 mL  3 mL Intravenous Q12H Clinton Sawyer A, DO   3 mL at 03/30/19 0841  . traZODone  (DESYREL) tablet 50 mg  50 mg Oral QHS PRN Clinton Sawyer A, DO   50 mg at 03/29/19 2301  . [START ON 03/31/2019] vancomycin (VANCOREADY) IVPB 1250 mg/250 mL  1,250 mg Intravenous Q24H Lenis Noon, [facility]        Allergies as of 03/29/2019 - Review Complete 03/29/2019  Allergen Reaction Noted  . Amoxicillin Itching 08/24/2018  . Penicillin g Itching 08/24/2018  Family History  Problem Relation Age of Onset  . Kidney disease Mother     Social History   Socioeconomic History  . Marital status: Single    Spouse name: Not on file  . Number of children: Not on file  . Years of education: Not on file  . Highest education level: Not on file  Occupational History  . Occupation: n/a  Tobacco Use  . Smoking status: Current Every Day Smoker    Packs/day: 1.00    Years: 40.00    Pack years: 40.00    Types: Cigarettes    Start date: 04/06/1975  . Smokeless tobacco: Never Used  . Tobacco comment: pt. not ready to quit  Substance and Sexual Activity  . Alcohol use: Yes    Alcohol/week: 100.0 standard drinks    Types: 100 Standard drinks or equivalent per week    Comment: pt. states she is an alcoholic - 6-pack a day if she has the money  . Drug use: Yes    Frequency: 3.0 times per week    Types: Marijuana  . Sexual activity: Not Currently    Partners: Male    Comment: pt. given condoms  Other Topics Concern  . Not on file  Social History Narrative  . Not on file   Social Determinants of Health   Financial Resource Strain:   . Difficulty of Paying Living Expenses: Not on file  Food Insecurity:   . Worried About Charity fundraiser in the Last Year: Not on file  . Ran Out of Food in the Last Year: Not on file  Transportation Needs:   . Lack of Transportation (Medical): Not on file  . Lack of Transportation (Non-Medical): Not on file  Physical Activity:   . Days of Exercise per Week: Not on file  . Minutes of Exercise per Session: Not on file  Stress:   . Feeling of  Stress : Not on file  Social Connections:   . Frequency of Communication with Friends and Family: Not on file  . Frequency of Social Gatherings with Friends and Family: Not on file  . Attends Religious Services: Not on file  . Active Member of Clubs or Organizations: Not on file  . Attends Archivist Meetings: Not on file  . Marital Status: Not on file  Intimate Partner Violence:   . Fear of Current or Ex-Partner: Not on file  . Emotionally Abused: Not on file  . Physically Abused: Not on file  . Sexually Abused: Not on file    Review of Systems: General: Has fever and weakness. Not able to provide.  Physical Exam: Vital signs in last 24 hours: Temp:  [98.3 F (36.8 C)-101.7 F (38.7 C)] 99.1 F (37.3 C) (12/25 1343) Pulse Rate:  [99-118] 102 (12/25 1343) Resp:  [16-27] 18 (12/25 1343) BP: (124-152)/(76-113) 134/87 (12/25 1343) SpO2:  [92 %-100 %] 98 % (12/25 1343) Weight:  [60 kg] 60 kg (12/24 2100) Last BM Date: 03/29/19 General:   Alert, cachectic female.  Wanted pain medications HENT: Has palsy. Lungs:  Clear throughout to auscultation.   No wheezes, crackles, or rhonchi.  Heart:  Regular rate and rhythm; no murmurs, clicks, rubs,  or gallops. Abdomen: Inspection: No visible peristalsis, no abnormal pulsations, skin is normal.  Palpation/percussion : Nontender, no rigidity, no abnormal dullness to percussion, no hepatosplenomegaly or palpable abdominal masses.  Auscultation: Normal bowel sounds and no abdominal bruits.    Rectal:  Deferred  Extremities:  Without  clubbing or edema. Neurologic:  Alert and  oriented x 3;  grossly normal neurologically. Skin:  Intact without significant lesions or rashes.. Psych:  Alert and cooperative. Normal mood and affect.  Intake/Output from previous day: 12/24 0701 - 12/25 0700 In: -  Out: 500 [Urine:500] Intake/Output this shift: No intake/output data recorded.  Lab Results: Recent Labs    03/29/19 1247  03/29/19 2256  WBC 3.8* 3.4*  HGB 5.3* 5.4*  HCT 17.6* 18.5*  PLT 23* 32*   BMET Recent Labs    03/29/19 1247 03/30/19 0153  NA 131* 134*  K 3.9 4.0  CL 99 99  CO2 22 26  GLUCOSE 85 88  BUN 13 14  CREATININE 0.48 0.45  CALCIUM 8.5* 8.8*   LFT No results for input(s): PROT, ALBUMIN, AST, ALT, ALKPHOS, BILITOT, BILIDIR, IBILI in the last 72 hours. PT/INR Recent Labs    03/29/19 2256  LABPROT 13.6  INR 1.1   Hepatitis Panel No results for input(s): HEPBSAG, HCVAB, HEPAIGM, HEPBIGM in the last 72 hours.    Studies/Results:      Jackquline Denmark, MD  03/30/2019, 2:06 PM

## 2019-03-30 NOTE — Progress Notes (Signed)
Pharmacy Antibiotic Note  Kristen Roman is a 58 y.o. female admitted on 03/29/2019 with sepsis.  Pharmacy has been consulted for vancomycin + aztreonam dosing.  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 Previously followed by hospice. Broad spectrum antibiotics being started for sepsis, noted PCN allergy.   Today, 03/30/19  WBC 3.4  SCr WNL  PCT 0.23, lactate 0.9  Tmax 101.7 F  Plan:  Aztreonam 2 g IV q8h  Vancomycin 1250 mg IV q24h  Goal vancomycin AUC 400-550  Follow renal function, check vancomycin levels once at steady state as needed  F/u culture data  Height: 5' 4.96" (165 cm) Weight: 132 lb 4.4 oz (60 kg) IBW/kg (Calculated) : 56.91  Temp (24hrs), Avg:100.1 F (37.8 C), Min:98.3 F (36.8 C), Max:101.7 F (38.7 C)  Recent Labs  Lab 03/29/19 1247 03/29/19 2256 03/30/19 0153 03/30/19 0445  WBC 3.8* 3.4*  --   --   CREATININE 0.48  --  0.45  --   LATICACIDVEN  --   --   --  0.9    Estimated Creatinine Clearance: 68.9 mL/min (by C-G formula based on SCr of 0.45 mg/dL).    Allergies  Allergen Reactions  . Amoxicillin Itching  . Penicillin G Itching    Did it involve swelling of the face/tongue/throat, SOB, or low BP? No Did it involve sudden or severe rash/hives, skin peeling, or any reaction on the inside of your mouth or nose? No Did you need to seek medical attention at a hospital or doctor's office? yes When did it last happen?recently If all above answers are "NO", may proceed with cephalosporin use.    Antimicrobials this admission: aztreonam 12/25 >>  vancomycin 12/25 >>   Dose adjustments this admission:  Microbiology results: 12/25 BCx: Sent 12/25 SARS-2: Negative  Thank you for allowing pharmacy to be a part of this patient's care.  Lenis Noon, PharmD 03/30/2019 8:12 AM

## 2019-03-30 NOTE — Progress Notes (Signed)
Repaged DR,temp increase to 101.7, blood cultures done,

## 2019-03-30 NOTE — H&P (Addendum)
History and Physical    OCTIVIA Roman [id number] DOB: January 27, 1961 DOA: 03/29/2019  PCP: [facility]  Patient coming from: Home/recent hospice discharge  I have personally briefly reviewed patient's old medical records in [place]  Chief Complaint: Headache, fatigue  HPI: Kristen Roman is a 58 y.o. 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  Of note, patient recently discontinued hospice.  Patient is a reluctant historian with unclear insight to her condition.  She explains that she left hospice because she felt like she was overmedicated and since leaving she has had increasing pain which has become unbearable as well as increasing fatigue and weakness.  She specifically requesting a place in a nursing home as she is not able to care for herself at home, does not want to return to hospice.  Specifically, she names [facility]. She reports she is mostly constipated, there has had 2 bowel movements today.  She explains she intermittently has noted blood in her stool over the last week, though is unable to quantify the amount of blood or frequency that she has noted this.  She denies specifically seeing any blood in her stool today. Denies chest pain, shortness of breath, vomiting, diarrhea.  ED Course: On arrival to the ED pt was tachycardic though hemodynamically stable otherwise.  Labs and imaging studies acquired. Initial labwork notable for anemia with hemoglobin of 5.3, thrombocytopenia platelets 23, WBC 3.  Fecal occult blood positive.  Imaging studies notable for no acute changes on her head CT, however he demonstrated brain mass and permeative bone destruction. Patient was administered Norco, Benadryl, Compazine. Given concern for possible GI bleed, decision was made to admit.    Review of Systems: As per HPI otherwise 10 point review of systems negative.   Past Medical History:  Diagnosis Date  . Cancer ([place])    "bone cancer"  . Hemorrhoid   . HIV disease ([place])   . Hypertension     Past Surgical History:  Procedure Laterality Date  . ABDOMINAL HYSTERECTOMY    . CESAREAN SECTION       reports that she has been smoking cigarettes. She started smoking about 44 years ago. She has a 40.00 pack-year smoking history. She has never used smokeless tobacco. She reports current alcohol use of about 100.0 standard drinks of alcohol per week. She reports current drug use. Frequency: 3.00 times per week. Drug: Marijuana.  Allergies  Allergen Reactions  . Amoxicillin Itching  . Penicillin G Itching    Did it involve swelling of the face/tongue/throat, SOB, or low BP? No Did it involve sudden or severe rash/hives, skin peeling, or any reaction on the inside of your mouth or nose? No Did you need to seek medical attention at a hospital or doctor's office? yes When did it last happen?recently If all above answers are "NO", may proceed with cephalosporin use.    Family History  Problem Relation Age of Onset  . Kidney disease Mother     Prior to Admission medications   Medication Sig Start Date End Date Taking? Authorizing Provider  ACETAMINOPHEN EXTRA STRENGTH 500 MG tablet Take 500 mg by mouth every 4 (four) hours as needed for fever or pain. 03/12/19  Yes [provider]  bictegravir-emtricitabine-tenofovir AF (BIKTARVY) 50-200-25 MG TABS tablet Take 1 tablet by mouth daily.    Yes [provider]  dexamethasone (DECADRON) 4 MG tablet Take 4 mg by mouth every morning. 03/12/19  Yes [provider]  ferrous sulfate 325 (65 FE) MG tablet Take 1 tablet (325 mg total) by mouth 2 (two) times daily with a meal. 02/03/19  Yes Palumbo, April, MD  folic acid (FOLVITE) 1 MG tablet Take 1 tablet (1 mg total) by mouth daily. 02/12/19  Yes  Bonnell Public, MD  Multiple Vitamin (MULTIVITAMIN WITH MINERALS) TABS tablet Take 1 tablet by mouth daily. 02/11/19  Yes Dana Allan I, MD  omeprazole (PRILOSEC) 20 MG capsule Take 20 mg by mouth every morning. 03/12/19  Yes [provider]  Oxycodone HCl 10 MG TABS Take 10-15 mg by mouth every 4 (four) hours as needed for pain. 03/27/19  Yes [provider]  OXYCONTIN 15 MG 12 hr tablet Take 15 mg by mouth 2 (two) times daily. 03/27/19  Yes [provider]  SENNA-S 8.6-50 MG tablet Take 2 tablets by mouth 2 (two) times daily. 03/20/19  Yes [provider]  citalopram (CELEXA) 10 MG tablet TAKE 1 TABLET BY MOUTH EVERY DAY Patient taking differently: Take 10 mg by mouth daily.  02/21/14   Carlyle Basques, MD  feeding supplement, ENSURE ENLIVE, (ENSURE ENLIVE) LIQD Take 237 mLs by mouth 2 (two) times daily between meals. Patient not taking: Reported on 12/11/2018 11/09/18   Swayze, Ava, DO  methadone (DOLOPHINE) 10 MG tablet Take 10 mg by mouth 2 (two) times daily. 01/15/19   [provider]    Physical Exam: Vitals:   03/29/19 2000 03/29/19 2056 03/29/19 2100 03/30/19 0045  BP: 126/83 130/84  134/86  Pulse: (!) 118 (!) 114  (!) 107  Resp: (!) 23 20  16   Temp:  98.3 F (36.8 C)  (!) 101.6 F (38.7 C)  TempSrc:    Oral  SpO2: 96% 100%  96%  Weight:   60 kg   Height:   5' 4.96" (1.65 m)     Constitutional: NAD, calm, comfortable Vitals:   03/29/19 2000 03/29/19 2056 03/29/19 2100 03/30/19 0045  BP: 126/83 130/84  134/86  Pulse: (!) 118 (!) 114  (!) 107  Resp: (!) 23 20  16   Temp:  98.3 F (36.8 C)  (!) 101.6 F (38.7 C)  TempSrc:    Oral  SpO2: 96% 100%  96%  Weight:   60 kg   Height:   5' 4.96" (1.65 m)    Eyes: PERRL, lids and conjunctivae normal ENMT: Mucous membranes are moist. Posterior pharynx clear of any exudate or lesions. Neck: diffuse coarse texture, no tracheal deviation Respiratory: clear to auscultation  bilaterally, no wheezing, no crackles. Normal respiratory effort. No accessory muscle use.  Cardiovascular: Regular rate and rhythm, no murmurs / rubs / gallops. No extremity edema. 2+ pedal pulses. No carotid bruits.  Abdomen: no tenderness, no masses palpated. No hepatosplenomegaly. Bowel sounds positive.  Musculoskeletal: no clubbing / cyanosis. No joint deformity upper and lower extremities. Good ROM, no contractures. Normal muscle tone.  Skin: no rashes, lesions, ulcers. 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  Sensation intact.  Strength 5/5 in all 4.  Psychiatric: Poor insight, blunted affect. Alert and oriented x 3.  Depressed mood  Labs on Admission: I have personally reviewed following labs and imaging studies  CBC: Recent Labs  Lab 03/29/19 1247 03/29/19 2256  WBC 3.8* 3.4*  NEUTROABS 1.5*  --   HGB 5.3* 5.4*  HCT 17.6* 18.5*  MCV 82.2 84.5  PLT 23* 32*   Basic Metabolic Panel: Recent Labs  Lab 03/29/19 1247  NA 131*  K 3.9  CL 99  CO2 22  GLUCOSE 85  BUN 13  CREATININE 0.48  CALCIUM 8.5*   GFR: Estimated Creatinine Clearance: 68.9 mL/min (by C-G formula based on SCr of 0.48 mg/dL). Liver Function Tests: No results for input(s): AST, ALT, ALKPHOS, BILITOT, PROT, ALBUMIN in the last 168 hours. No results for input(s): LIPASE, AMYLASE in the last 168 hours. No results for input(s): AMMONIA in the last 168 hours. Coagulation Profile: Recent Labs  Lab 03/29/19 2256  INR 1.1   Cardiac Enzymes: No results for input(s): CKTOTAL, CKMB, CKMBINDEX, TROPONINI in the last 168 hours. BNP (last 3 results) No results for input(s): PROBNP in the last 8760 hours. HbA1C: No results for input(s): HGBA1C in the last 72 hours. CBG: No results for input(s): GLUCAP in the last 168 hours. Lipid Profile: No results for input(s): CHOL, HDL, LDLCALC, TRIG,  CHOLHDL, LDLDIRECT in the last 72 hours. Thyroid Function Tests: No results for input(s): TSH, T4TOTAL, FREET4, T3FREE, THYROIDAB in the last 72 hours. Anemia Panel: No results for input(s): VITAMINB12, FOLATE, FERRITIN, TIBC, IRON, RETICCTPCT in the last 72 hours. Urine analysis:    Component Value Date/Time   COLORURINE YELLOW 02/06/2019 2228   APPEARANCEUR CLEAR 02/06/2019 2228   LABSPEC 1.015 02/06/2019 2228   PHURINE 5.0 02/06/2019 2228   GLUCOSEU NEGATIVE 02/06/2019 2228   GLUCOSEU NEG mg/dL 12/29/2006 2001   HGBUR NEGATIVE 02/06/2019 2228   BILIRUBINUR NEGATIVE 02/06/2019 2228   KETONESUR NEGATIVE 02/06/2019 2228   PROTEINUR NEGATIVE 02/06/2019 2228   UROBILINOGEN 1 12/29/2006 2001   NITRITE NEGATIVE 02/06/2019 2228   LEUKOCYTESUR NEGATIVE 02/06/2019 2228    Radiological Exams on Admission: CT Head Wo Contrast  Result Date: 03/29/2019 CLINICAL DATA:  History of radiation therapy for a right parotid tumor. Primary of bone destruction at the skull base with a soft tissue component on recent MRI, suspicious for metastatic disease. The patient also has dural enhancement and dural based mass in the left middle cranial fossa on the recent MRI. No reported symptoms at this time. EXAM: CT HEAD WITHOUT CONTRAST TECHNIQUE: Contiguous axial images were obtained from the base of the skull through the vertex without intravenous contrast. COMPARISON:  Brain MR dated 03/15/2019. Head CT dated 02/06/2019. FINDINGS: Brain: Stable mildly enlarged ventricles and cortical sulci. Oval area of low density in the left temporal lobe in the middle cranial fossa, corresponding to the mass seen on the recent MRI. Calcification in the pituitary gland with no pituitary mass seen on the recent MR. No intracranial hemorrhage or CT evidence of acute infarction. Vascular: No hyperdense vessel or unexpected calcification. Skull: Previously demonstrated permeative bone destruction at the skull base is again  demonstrated, limited by motion artifacts on the current images. Again demonstrated is opacification of the mastoid air cells on the right. Sinuses/Orbits: The included portions are unremarkable, limited by motion artifacts. Right mastoid air cell opacification with mild progression since 02/06/2019. Other: None. IMPRESSION: 1. Limited examination due to motion artifacts with no acute abnormality seen. 2. Grossly stable left temporal lobe mass, compatible with the patient's known tumor. 3. Grossly stable permeative bone destruction at the skull base and mildly progressive opacification of the right mastoid air cells, suspicious for metastatic disease. 4. Pituitary gland calcifications, possibly due to the patient's previous  radiation therapy. 5. Stable mild diffuse cerebral and cerebellar atrophy. Electronically Signed   By: Claudie Revering M.D.   On: 03/29/2019 14:40   Assessment/Plan Active Problems:   Symptomatic anemia   Anemia  (please populate well all problems here in Problem List. (For example, if patient is on BP meds at home and you resume or decide to hold them, it is a problem that needs to be her. Same for CAD, COPD, HLD and so on)  Pancytopenia with symptomatic anemia Possible occult GI bleed Patient with chronic pancytopenia likely related to her HIV, as well as malignancy.  Hemoglobin 5.3, platelets 23 at arrival.  WBC 3.8.  Last hemoglobin on record 7.2 on 03/19/2019, presenting on 12/24 at 5.3.  She reports intermittent maroon stools, though is unable to quantify amount or when her last bloody stool occurred.  Hemoccult positive today.  Given the subacute nature and some prior hemoglobins less than 7 in the past do not suspect this is an acute process.  She does have some associated mild tachycardia, that otherwise appears euvolemic and hemodynamically stable. Plan: -Maintain 2 large-bore IVs -Protonix, avoiding chemical DVT prophylaxis -Ordered 2 units PRBC transfusion -No evidence of  active bleeding, trend CBC, if continues to drop obtain GI consult in a.m. for possible endoscopy   Stage IV parotid adenocarcinoma with metastasis to brain/meninges -Patient has known stage IV parotid adenocarcinoma with metastasis to brain/meninges and resulting right-sided facial droop from cranial nerve VI palsy.  Recent hospice discharge because she does not like the way she has been managed. -Consider palliative care consult in a.m. -Pain medications for malignancy related pain ordered-added Duragesic patch today for associated nausea and decreased ability to take p.o. pain medications. -Continue home Decadron to reduce neurogenic edema.  Asymptomatic hyponatremia -Sodium 131 on arrival, asymptomatic, likely related to decreased p.o. intake.  Monitor for improvement.  If recalcitrant, consider SIADH given patient's known brain metastasis.  Failure to thrive -PT evaluation in a.m.  Polysubstance abuse Bipolar disorder -Continue home Celexa  HIV -Continue home Biktarvy   DVT prophylaxis: SCDs given bleeding Code Status: DNR.  This was discussed on the day of admission. Family Communication: None present. Disposition Plan: Likely discharge to skilled nursing facility versus hospice in 2 to 3 days, pending ongoing goals of care discussions Consults called: None Admission status: Inpatient telemetry   [facility] Pager [phone]  If 7PM-7AM, please contact night-coverage [email or web address] Password [email or web address]  03/30/2019, 1:36 AM

## 2019-03-30 NOTE — Progress Notes (Signed)
Patient's Temp 100.4 and tylenol due to be given and also tylenol was given an hour ago, Dr. Horris Latino notified, stated to recheck temp in an hour.

## 2019-03-30 NOTE — Progress Notes (Signed)
Pt refusing their second unit of PRBC this shift. Pt states that they will let the night nurse administer the blood but is tired of the IV pole at this point. MD notified

## 2019-03-30 NOTE — Progress Notes (Signed)
Pt temperature stabilizing this shift to 99.1-99.2. Per MD, nursing can administer blood this shift. Will continue to monitor pt vitals.

## 2019-03-30 NOTE — Progress Notes (Signed)
NP Ardith Dark return call, hold off on blood, new orders placed.

## 2019-03-31 DIAGNOSIS — K922 Gastrointestinal hemorrhage, unspecified: Secondary | ICD-10-CM

## 2019-03-31 DIAGNOSIS — K5900 Constipation, unspecified: Secondary | ICD-10-CM

## 2019-03-31 DIAGNOSIS — R195 Other fecal abnormalities: Secondary | ICD-10-CM

## 2019-03-31 DIAGNOSIS — D61818 Other pancytopenia: Secondary | ICD-10-CM

## 2019-03-31 DIAGNOSIS — L8990 Pressure ulcer of unspecified site, unspecified stage: Secondary | ICD-10-CM | POA: Insufficient documentation

## 2019-03-31 LAB — CBC WITH DIFFERENTIAL/PLATELET
Abs Immature Granulocytes: 0.4 10*3/uL — ABNORMAL HIGH (ref 0.00–0.07)
Basophils Absolute: 0 10*3/uL (ref 0.0–0.1)
Basophils Relative: 1 %
Eosinophils Absolute: 0 10*3/uL (ref 0.0–0.5)
Eosinophils Relative: 0 %
HCT: 25.6 % — ABNORMAL LOW (ref 36.0–46.0)
Hemoglobin: 8 g/dL — ABNORMAL LOW (ref 12.0–15.0)
Immature Granulocytes: 11 %
Lymphocytes Relative: 34 %
Lymphs Abs: 1.2 10*3/uL (ref 0.7–4.0)
MCH: 26.7 pg (ref 26.0–34.0)
MCHC: 31.3 g/dL (ref 30.0–36.0)
MCV: 85.3 fL (ref 80.0–100.0)
Monocytes Absolute: 0.5 10*3/uL (ref 0.1–1.0)
Monocytes Relative: 14 %
Neutro Abs: 1.4 10*3/uL — ABNORMAL LOW (ref 1.7–7.7)
Neutrophils Relative %: 40 %
Platelets: 13 10*3/uL — CL (ref 150–400)
RBC: 3 MIL/uL — ABNORMAL LOW (ref 3.87–5.11)
RDW: 16.8 % — ABNORMAL HIGH (ref 11.5–15.5)
WBC Morphology: INCREASED
WBC: 3.6 10*3/uL — ABNORMAL LOW (ref 4.0–10.5)
nRBC: 3.9 % — ABNORMAL HIGH (ref 0.0–0.2)

## 2019-03-31 LAB — HEPATIC FUNCTION PANEL
ALT: 9 U/L (ref 0–44)
AST: 19 U/L (ref 15–41)
Albumin: 2.6 g/dL — ABNORMAL LOW (ref 3.5–5.0)
Alkaline Phosphatase: 78 U/L (ref 38–126)
Bilirubin, Direct: <0.1 mg/dL (ref 0.0–0.2)
Total Bilirubin: 0.2 mg/dL — ABNORMAL LOW (ref 0.3–1.2)
Total Protein: 5.5 g/dL — ABNORMAL LOW (ref 6.5–8.1)

## 2019-03-31 LAB — PROCALCITONIN: Procalcitonin: 0.14 ng/mL

## 2019-03-31 LAB — BASIC METABOLIC PANEL
Anion gap: 11 (ref 5–15)
BUN: 15 mg/dL (ref 6–20)
CO2: 23 mmol/L (ref 22–32)
Calcium: 8.9 mg/dL (ref 8.9–10.3)
Chloride: 97 mmol/L — ABNORMAL LOW (ref 98–111)
Creatinine, Ser: 0.51 mg/dL (ref 0.44–1.00)
GFR calc Af Amer: >60 mL/min (ref >60)
GFR calc non Af Amer: >60 mL/min (ref >60)
Glucose, Bld: 87 mg/dL (ref 70–99)
Potassium: 4.1 mmol/L (ref 3.5–5.1)
Sodium: 131 mmol/L — ABNORMAL LOW (ref 135–145)

## 2019-03-31 LAB — LACTATE DEHYDROGENASE: LDH: 693 U/L — ABNORMAL HIGH (ref 98–192)

## 2019-03-31 LAB — PREPARE RBC (CROSSMATCH)

## 2019-03-31 MED ORDER — PANTOPRAZOLE SODIUM 40 MG IV SOLR
40.0000 mg | Freq: Two times a day (BID) | INTRAVENOUS | Status: DC
  Administered 2019-03-31 – 2019-04-02 (×4): 40 mg via INTRAVENOUS
  Filled 2019-03-31 (×4): qty 40

## 2019-03-31 MED ORDER — DIPHENHYDRAMINE HCL 25 MG PO CAPS
25.0000 mg | ORAL_CAPSULE | Freq: Three times a day (TID) | ORAL | Status: DC | PRN
  Administered 2019-04-01 – 2019-04-06 (×3): 25 mg via ORAL
  Filled 2019-03-31 (×5): qty 1

## 2019-03-31 MED ORDER — POLYETHYLENE GLYCOL 3350 17 G PO PACK
17.0000 g | PACK | Freq: Two times a day (BID) | ORAL | Status: DC
  Administered 2019-03-31 – 2019-04-25 (×15): 17 g via ORAL
  Filled 2019-03-31 (×40): qty 1

## 2019-03-31 MED ORDER — HYDRALAZINE HCL 20 MG/ML IJ SOLN
10.0000 mg | Freq: Once | INTRAMUSCULAR | Status: AC
  Administered 2019-04-01: 10 mg via INTRAVENOUS
  Filled 2019-03-31: qty 1

## 2019-03-31 MED ORDER — SODIUM CHLORIDE 0.9% IV SOLUTION: Freq: Once | INTRAVENOUS | Status: AC

## 2019-03-31 NOTE — Progress Notes (Signed)
Due to the lab not recording the returning the blood the previous evening, Epic showed that 2 units of PRBCs had already been given. I placed order for 1 u PRBC to correct the issue so that I could give the second unit of PRBCs.

## 2019-03-31 NOTE — Consult Note (Signed)
Referral MD  Reason for Referral: Pancytopenia; metastatic adenocarcinoma of the parotid gland; HIV positive  Chief Complaint  Patient presents with  . Weakness  . Eye Problem  : I just want to go out here.  HPI: Ms. Kristen Roman is a 58 year old African-American female.  She has a complicated history.  She has history of metastatic adenocarcinoma of the parotid gland.  She had been seen out of [facility].  I was sure if they could really do I think for out there.  I do not think she was on any systemic therapy.  She did have some radiation therapy.    She has HIV disease.  I do not think she has been compliant with taking her medications.  She has been on hospice.  She apparently has not declined hospice.  She came in with weakness.  She was pancytopenic.  She was admitted on the 24th.  When she came in, her white cell count 3.4.  Hemoglobin 5.4.  Platelet count 32,000.  Her blood count today shows a white cell count 3.6.  Hemoglobin 8.  Platelet count 13,000.  Her electrolytes when she came in showed a sodium 131.  Potassium 3.9.  BUN 13 creatinine 0.48.  Her liver function studies today show a albumin of 2.6.  Her bilirubin is less than 0.1.  I think cultures have been taken.  They are all negative.  She is on broad-spectrum antibiotics.  ID is following her because of the HIV.  GI saw her because of heme positive stools.  She is not candidate for any type of endoscopic procedure because of her thrombocytopenia.  I think the real problem though is that she has marrow invasion by her cancer.  Under the microscope, she has marked anisocytosis and poikilocytosis.  She has teardrop cells.  She has nucleated red blood cells.  She has immature myeloid cells.  She has all the findings consistent with marrow invasion.  Her LDH when she came in was 976.  Today her LDH is 693.  She clearly has some neurological deficiencies.  There is I guess is from her CNS disease.  She does not want to go  back to [facility].  She does not think that the doing thing for her.  I told her that there is nothing that we can offer her for the parotid gland carcinoma.  Her performance status is not good at all.  At best, her performance status is ECOG 3.  I would have to think that given the fact that she not likely has bone marrow invasion by her cancer, that her prognosis is not all that good.  I do not think that she has to have any invasive procedure done.  I think the blood smear shows [place] what the real problem here is.  Unfortunately, she will agree to hospice right now.  I think this is what she needs.  She has no one to help take care of her.  She cannot live by herself.  I guess the HIV is being treated.  I am not sure this is really good to be an issue for her.      Past Medical History:  Diagnosis Date  . Cancer ([place])    "bone cancer"  . Hemorrhoid   . HIV disease ([place])   . Hypertension   :  Past Surgical History:  Procedure Laterality Date  . ABDOMINAL HYSTERECTOMY    . CESAREAN SECTION    :   Current Facility-Administered Medications:  .  0.9 %  sodium chloride infusion (Manually program via Guardrails IV Fluids), , Intravenous, Once, Alma Friendly, MD .  0.9 %  sodium chloride infusion, , Intravenous, PRN, Alma Friendly, MD, Stopped at 03/30/19 2302 .  acetaminophen (TYLENOL) tablet 650 mg, 650 mg, Oral, Q6H PRN, 650 mg at 03/31/19 1147 **OR** acetaminophen (TYLENOL) suppository 650 mg, 650 mg, Rectal, Q6H PRN, Clinton Sawyer A, DO .  aztreonam (AZACTAM) 2 g in sodium chloride 0.9 % 100 mL IVPB, 2 g, Intravenous, Q8H, Alma Friendly, MD, Last Rate: 200 mL/hr at 03/31/19 1133, 2 g at 03/31/19 1133 .  bictegravir-emtricitabine-tenofovir AF (BIKTARVY) 50-200-25 MG per tablet 1 tablet, 1 tablet, Oral, Daily, Margart Sickles, DO, 1 tablet at 03/31/19 [id number] .  bisacodyl (DULCOLAX) suppository 10 mg, 10 mg, Rectal, Daily PRN, Margart Sickles, DO .  citalopram  (CELEXA) tablet 10 mg, 10 mg, Oral, Daily, Clinton Sawyer A, DO, 10 mg at 03/31/19 [id number] .  dexamethasone (DECADRON) tablet 4 mg, 4 mg, Oral, q morning - 10a, Clinton Sawyer A, DO, 4 mg at 03/31/19 [id number] .  feeding supplement (ENSURE ENLIVE) (ENSURE ENLIVE) liquid 237 mL, 237 mL, Oral, BID BM, Petty, Stephen A, DO .  fentaNYL (DURAGESIC) 25 MCG/HR 1 patch, 1 patch, Transdermal, Q72H, Margart Sickles, DO, 1 patch at 03/29/19 2301 .  folic acid (FOLVITE) tablet 1 mg, 1 mg, Oral, Daily, Clinton Sawyer A, DO, 1 mg at 03/31/19 [id number] .  multivitamin with minerals tablet 1 tablet, 1 tablet, Oral, Daily, Margart Sickles, DO, 1 tablet at 03/31/19 [id number] .  ondansetron (ZOFRAN) tablet 4 mg, 4 mg, Oral, Q6H PRN **OR** ondansetron (ZOFRAN) injection 4 mg, 4 mg, Intravenous, Q6H PRN, Clinton Sawyer A, DO .  oxyCODONE (Oxy IR/ROXICODONE) immediate release tablet 10 mg, 10 mg, Oral, Q4H PRN, Clinton Sawyer A, DO, 10 mg at 03/31/19 1147 .  pantoprazole (PROTONIX) injection 40 mg, 40 mg, Intravenous, Q12H, Gribbin, Sarah J, PA-C .  polyethylene glycol (MIRALAX / GLYCOLAX) packet 17 g, 17 g, Oral, BID, Vena Rua, PA-C, 17 g at 03/31/19 1131 .  senna-docusate (Senokot-S) tablet 2 tablet, 2 tablet, Oral, BID, Margart Sickles, DO, 1 tablet at 03/30/19 2210 .  sodium chloride flush (NS) 0.9 % injection 3 mL, 3 mL, Intravenous, Q12H, Clinton Sawyer A, DO, 3 mL at 03/31/19 1122 .  traZODone (DESYREL) tablet 50 mg, 50 mg, Oral, QHS PRN, Clinton Sawyer A, DO, 50 mg at 03/31/19 0002 .  vancomycin (VANCOREADY) IVPB 1250 mg/250 mL, 1,250 mg, Intravenous, Q24H, Lenis Noon, [facility], Last Rate: 166.7 mL/hr at 03/31/19 0553, 1,250 mg at 03/31/19 0553:  . sodium chloride   Intravenous Once  . bictegravir-emtricitabine-tenofovir AF  1 tablet Oral Daily  . citalopram  10 mg Oral Daily  . dexamethasone  4 mg Oral q morning - 10a  . feeding supplement (ENSURE ENLIVE)  237 mL Oral BID BM  . fentaNYL  1 patch Transdermal Q72H  .  folic acid  1 mg Oral Daily  . multivitamin with minerals  1 tablet Oral Daily  . pantoprazole (PROTONIX) IV  40 mg Intravenous Q12H  . polyethylene glycol  17 g Oral BID  . senna-docusate  2 tablet Oral BID  . sodium chloride flush  3 mL Intravenous Q12H  :  Allergies  Allergen Reactions  . Amoxicillin Itching  . Penicillin G Itching    Did it involve swelling of the face/tongue/throat, SOB, or low BP? No Did it involve  sudden or severe rash/hives, skin peeling, or any reaction on the inside of your mouth or nose? No Did you need to seek medical attention at a hospital or doctor's office? yes When did it last happen?recently If all above answers are "NO", may proceed with cephalosporin use.  :  Family History  Problem Relation Age of Onset  . Kidney disease Mother   :  Social History   Socioeconomic History  . Marital status: Single    Spouse name: Not on file  . Number of children: Not on file  . Years of education: Not on file  . Highest education level: Not on file  Occupational History  . Occupation: n/a  Tobacco Use  . Smoking status: Current Every Day Smoker    Packs/day: 1.00    Years: 40.00    Pack years: 40.00    Types: Cigarettes    Start date: 04/06/1975  . Smokeless tobacco: Never Used  . Tobacco comment: pt. not ready to quit  Substance and Sexual Activity  . Alcohol use: Yes    Alcohol/week: 100.0 standard drinks    Types: 100 Standard drinks or equivalent per week    Comment: pt. states she is an alcoholic - 6-pack a day if she has the money  . Drug use: Yes    Frequency: 3.0 times per week    Types: Marijuana  . Sexual activity: Not Currently    Partners: Male    Comment: pt. given condoms  Other Topics Concern  . Not on file  Social History Narrative  . Not on file   Social Determinants of Health   Financial Resource Strain:   . Difficulty of Paying Living Expenses: Not on file  Food Insecurity:   . Worried About Sales executive in the Last Year: Not on file  . Ran Out of Food in the Last Year: Not on file  Transportation Needs:   . Lack of Transportation (Medical): Not on file  . Lack of Transportation (Non-Medical): Not on file  Physical Activity:   . Days of Exercise per Week: Not on file  . Minutes of Exercise per Session: Not on file  Stress:   . Feeling of Stress : Not on file  Social Connections:   . Frequency of Communication with Friends and Family: Not on file  . Frequency of Social Gatherings with Friends and Family: Not on file  . Attends Religious Services: Not on file  . Active Member of Clubs or Organizations: Not on file  . Attends Archivist Meetings: Not on file  . Marital Status: Not on file  Intimate Partner Violence:   . Fear of Current or Ex-Partner: Not on file  . Emotionally Abused: Not on file  . Physically Abused: Not on file  . Sexually Abused: Not on file  :  Review of Systems  Constitutional: Positive for malaise/fatigue.  HENT: Negative.   Eyes: Positive for blurred vision.  Respiratory: Positive for shortness of breath.   Cardiovascular: Negative.   Gastrointestinal: Positive for blood in stool.  Genitourinary: Negative.   Musculoskeletal: Positive for joint pain and myalgias.  Skin: Negative.   Neurological: Positive for focal weakness and weakness.  Endo/Heme/Allergies: Negative.      Exam: This is a ill-appearing somewhat debilitated appearing African-American female in no obvious distress.  Vital signs are temperature 98.8.  Pulse 66.  Blood pressure 159/79.  Weight is 132.  Head neck exam shows some ocular changes.  Looks like  she has some ptosis around the left side of her face.  Is hard to say if there is any type of cranial nerve deficit for the right eye.  I cannot palpate any obvious adenopathy in the neck.  Lungs are clear.  Cardiac exam regular rate and rhythm.  Abdomen is soft.  Bowel sounds decreased.  There is no fluid wave.  There is no  palpable liver or spleen tip.  Extremities show some diffuse weakness in upper and lower extremities.  Skin exam is with scattered ecchymoses.  Neurological exam shows some cranial nerve deficits.      Patient Vitals for the past 24 hrs:  BP Temp Temp src Pulse Resp SpO2  03/31/19 1144 (!) 155/91 -- -- 65 -- --  03/31/19 1142 (!) 162/86 97.9 F (36.6 C) Oral 67 18 100 %  03/31/19 0549 (!) 162/95 98.9 F (37.2 C) Oral 66 18 100 %  03/31/19 0215 (!) 163/110 97.9 F (36.6 C) Oral 78 18 100 %  03/30/19 2356 -- -- -- 71 -- 100 %  03/30/19 2355 (!) 147/91 98.7 F (37.1 C) Oral 71 18 100 %  03/30/19 2310 (!) 139/91 98.1 F (36.7 C) Oral 68 18 99 %  03/30/19 2102 (!) 157/90 99 F (37.2 C) Oral 81 18 99 %  03/30/19 1640 (!) 148/92 99.2 F (37.3 C) Oral 77 18 99 %  03/30/19 1636 -- 99.2 F (37.3 C) Oral -- -- --  03/30/19 1414 (!) 145/77 98.7 F (37.1 C) Oral 97 18 100 %  03/30/19 1343 134/87 99.1 F (37.3 C) Oral (!) 102 18 98 %  03/30/19 1316 134/89 -- -- 100 16 98 %     Recent Labs    03/30/19 2157 03/31/19 0608  WBC 3.4* 3.6*  HGB 6.0* 8.0*  HCT 19.5* 25.6*  PLT 14* 13*   Recent Labs    03/30/19 0153 03/31/19 0608  NA 134* 131*  K 4.0 4.1  CL 99 97*  CO2 26 23  GLUCOSE 88 87  BUN 14 15  CREATININE 0.45 0.51  CALCIUM 8.8* 8.9    Blood smear review: See above  Pathology: None    Assessment and Plan: Ms. Kristen Roman is a 58 year old African-American female.  She has metastatic adenocarcinoma of the parotid gland.  I have to believe that her cancer has metastasized to the bone marrow.  Her blood smear is incredibly consistent with marrow filtration.  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  This can often happen with marrow invasion by cancer.  There is absolutely nothing that can be offered for her at this time.  I really think that somehow, comfort care is what is in order for her.  Again, she will not get  hospice involved.  I doubt that her prognosis is going more than a month.  I actually talked to her about this.  She seems to be okay with dying.  I am still not sure why she is not willing for hospice to help her out.  At this point, I am not sure that any invasive procedure needs to be done.  She does not need to bone marrow as this really would not help [place] out any.  I really do not think that antibiotics are going to be needed.  I think her cultures are negative.  Again her pancytopenia I do think can be fixed at all.  May be it might be low bit better with  treating the HIV but overall, I do think that she has marrow invasion that is going to worsen.  I am not sure where she could go for end-of-life care.  Again, she actually does have a strong faith.  We talked about this.  She certainly is aware that she has a disease that cannot be fixed.  We will follow along and try to help out.  Clearly, this is a situation in which palliative care needs to get involved continue to help out to talk to the patient.  I spent about 45-50 minutes with her today.

## 2019-03-31 NOTE — Progress Notes (Addendum)
INFECTIOUS DISEASE PROGRESS NOTE  ID: Kristen Roman is a 58 y.o. female with  Active Problems:   Symptomatic anemia   Anemia   Pressure injury of skin  Subjective: No BM per pt.  Having nose bleeds Would like to be left alone so she can sleep.   Abtx:  Anti-infectives (From admission, onward)   Start     Dose/Rate Route Frequency Ordered Stop   03/31/19 0600  vancomycin (VANCOREADY) IVPB 1250 mg/250 mL     1,250 mg 166.7 mL/hr over 90 Minutes Intravenous Every 24 hours 03/30/19 0817     03/30/19 1200  aztreonam (AZACTAM) 2 g in sodium chloride 0.9 % 100 mL IVPB     2 g 200 mL/hr over 30 Minutes Intravenous Every 8 hours 03/30/19 1146     03/30/19 1000  bictegravir-emtricitabine-tenofovir AF (BIKTARVY) 50-200-25 MG per tablet 1 tablet     1 tablet Oral Daily 03/29/19 2225     03/30/19 0845  vancomycin (VANCOREADY) IVPB 1250 mg/250 mL     1,250 mg 166.7 mL/hr over 90 Minutes Intravenous  Once 03/30/19 0808 03/30/19 1027   03/30/19 0830  aztreonam (AZACTAM) 2 g in sodium chloride 0.9 % 100 mL IVPB  Status:  Discontinued     2 g 200 mL/hr over 30 Minutes Intravenous Every 8 hours 03/30/19 0810 03/30/19 1146      Medications:  Scheduled: . sodium chloride   Intravenous Once  . bictegravir-emtricitabine-tenofovir AF  1 tablet Oral Daily  . citalopram  10 mg Oral Daily  . dexamethasone  4 mg Oral q morning - 10a  . feeding supplement (ENSURE ENLIVE)  237 mL Oral BID BM  . fentaNYL  1 patch Transdermal Q72H  . folic acid  1 mg Oral Daily  . multivitamin with minerals  1 tablet Oral Daily  . pantoprazole (PROTONIX) IV  40 mg Intravenous Q12H  . polyethylene glycol  17 g Oral BID  . senna-docusate  2 tablet Oral BID  . sodium chloride flush  3 mL Intravenous Q12H    Objective: Vital signs in last 24 hours: Temp:  [97.9 F (36.6 C)-99.2 F (37.3 C)] 98.2 F (36.8 C) (12/26 1258) Pulse Rate:  [64-102] 64 (12/26 1258) Resp:  [16-20] 16 (12/26 1258) BP:  (134-165)/(77-110) 165/79 (12/26 1258) SpO2:  [98 %-100 %] 100 % (12/26 1258)   General appearance: alert, cooperative and no distress Resp: clear to auscultation bilaterally Cardio: regular rate and rhythm GI: normal findings: bowel sounds normal and soft, non-tender and abnormal findings:  distended  Lab Results Recent Labs    03/30/19 0153 03/30/19 2157 03/31/19 0608  WBC  --  3.4* 3.6*  HGB  --  6.0* 8.0*  HCT  --  19.5* 25.6*  NA 134*  --  131*  K 4.0  --  4.1  CL 99  --  97*  CO2 26  --  23  BUN 14  --  15  CREATININE 0.45  --  0.51   Liver Panel Recent Labs    03/31/19 0922  PROT 5.5*  ALBUMIN 2.6*  AST 19  ALT 9  ALKPHOS 78  BILITOT 0.2*  BILIDIR <0.1  IBILI NOT CALCULATED   Sedimentation Rate No results for input(s): ESRSEDRATE in the last 72 hours. C-Reactive Protein Recent Labs    03/30/19 1055  CRP 7.8*    Microbiology: Recent Results (from the past 240 hour(s))  SARS CORONAVIRUS 2 (TAT 6-24 HRS) Nasopharyngeal Nasopharyngeal Swab  Status: None   Collection Time: 03/29/19  2:27 PM   Specimen: Nasopharyngeal Swab  Result Value Ref Range Status   SARS Coronavirus 2 NEGATIVE NEGATIVE Final    Comment: (NOTE) SARS-CoV-2 target nucleic acids are NOT DETECTED. The SARS-CoV-2 RNA is generally detectable in upper and lower respiratory specimens during the acute phase of infection. Negative results do not preclude SARS-CoV-2 infection, do not rule out co-infections with other pathogens, and should not be used as the sole basis for treatment or other patient management decisions. Negative results must be combined with clinical observations, patient history, and epidemiological information. The expected result is Negative. Fact Sheet for Patients: SugarRoll.be Fact Sheet for Healthcare Providers: https://www.woods-mathews.com/ This test is not yet approved or cleared by the [place] FDA and  has been  authorized for detection and/or diagnosis of SARS-CoV-2 by FDA under an Emergency Use Authorization (EUA). This EUA will remain  in effect (meaning this test can be used) for the duration of the COVID-19 declaration under Section 56 4(b)(1) of the Act, 21 U.S.C. section 360bbb-3(b)(1), unless the authorization is terminated or revoked sooner. Performed at [facility], [place] [address], [place], [place] [address]   Culture, blood (routine x 2)     Status: None (Preliminary result)   Collection Time: 03/30/19  1:53 AM   Specimen: BLOOD RIGHT HAND  Result Value Ref Range Status   Specimen Description   Final    BLOOD RIGHT HAND Performed at [facility] [address], [place], [place] [address]    Special Requests   Final    BOTTLES DRAWN AEROBIC AND ANAEROBIC Blood Culture adequate volume Performed at [facility] [address], [place], [place] [address]    Culture   Final    NO GROWTH 1 DAY Performed at [facility], [place] [address] [place], [place] [address]    Report Status PENDING  Incomplete  Culture, blood (routine x 2)     Status: None (Preliminary result)   Collection Time: 03/30/19  1:53 AM   Specimen: BLOOD LEFT HAND  Result Value Ref Range Status   Specimen Description   Final    BLOOD LEFT HAND Performed at [facility] [address], [place], [place] [address]    Special Requests   Final    BOTTLES DRAWN AEROBIC ONLY Blood Culture adequate volume Performed at [facility] [address] [place], [place] [address]    Culture   Final    NO GROWTH 1 DAY Performed at [facility], [place] [address], [place], [place] [address]    Report Status PENDING  Incomplete    Studies/Results: CT Head Wo Contrast  Result Date: 03/29/2019 CLINICAL DATA:  History of radiation therapy for a right parotid tumor. Primary of bone destruction at the skull base with a soft tissue  component on recent MRI, suspicious for metastatic disease. The patient also has dural enhancement and dural based mass in the left middle cranial fossa on the recent MRI. No reported symptoms at this time. EXAM: CT HEAD WITHOUT CONTRAST TECHNIQUE: Contiguous axial images were obtained from the base of the skull through the vertex without intravenous contrast. COMPARISON:  Brain MR dated 03/15/2019. Head CT dated 02/06/2019. FINDINGS: Brain: Stable mildly enlarged ventricles and cortical sulci. Oval area of low density in the left temporal lobe in the middle cranial fossa, corresponding to the mass seen on the recent MRI. Calcification in the  pituitary gland with no pituitary mass seen on the recent MR. No intracranial hemorrhage or CT evidence of acute infarction. Vascular: No hyperdense vessel or unexpected calcification. Skull: Previously demonstrated permeative bone destruction at the skull base is again demonstrated, limited by motion artifacts on the current images. Again demonstrated is opacification of the mastoid air cells on the right. Sinuses/Orbits: The included portions are unremarkable, limited by motion artifacts. Right mastoid air cell opacification with mild progression since 02/06/2019. Other: None. IMPRESSION: 1. Limited examination due to motion artifacts with no acute abnormality seen. 2. Grossly stable left temporal lobe mass, compatible with the patient's known tumor. 3. Grossly stable permeative bone destruction at the skull base and mildly progressive opacification of the right mastoid air cells, suspicious for metastatic disease. 4. Pituitary gland calcifications, possibly due to the patient's previous radiation therapy. 5. Stable mild diffuse cerebral and cerebellar atrophy. Electronically Signed   By: Claudie Revering M.D.   On: 03/29/2019 14:40   CT ANGIO CHEST PE W OR WO CONTRAST  Result Date: 03/30/2019 CLINICAL DATA:  Shortness of breath. Evaluate for pulmonary embolus. EXAM: CT  ANGIOGRAPHY CHEST WITH CONTRAST TECHNIQUE: Multidetector CT imaging of the chest was performed using the standard protocol during bolus administration of intravenous contrast. Multiplanar CT image reconstructions and MIPs were obtained to evaluate the vascular anatomy. CONTRAST:  37mL OMNIPAQUE IOHEXOL 350 MG/ML SOLN COMPARISON:  02/03/2019 FINDINGS: Cardiovascular: Heart is enlarged. Coronary artery calcification is evident. Atherosclerotic calcification is noted in the wall of the thoracic aorta. No filling defect within the opacified pulmonary arteries to suggest the presence of an acute pulmonary embolus Mediastinum/Nodes: No mediastinal lymphadenopathy. There is no hilar lymphadenopathy. The esophagus has normal imaging features. There is no axillary lymphadenopathy. Lungs/Pleura: Bandlike areas of subpleural opacity are seen in the lower lungs bilaterally, right greater than left. This is likely atelectatic although component of infectious/inflammatory etiology not excluded. No pneumothorax or pleural effusion. Upper Abdomen: Unremarkable. Musculoskeletal: The widespread bony metastatic disease is similar in the interval. Review of the MIP images confirms the above findings. IMPRESSION: 1. No CT evidence for acute pulmonary embolus. 2. Areas of subpleural bandlike consolidation in the lower lungs, right greater than left. This is probably mainly atelectatic although component of underlying infectious/inflammatory etiology not entirely excluded. 3. Similar appearance widespread bony metastatic disease. Electronically Signed   By: Misty Stanley M.D.   On: 03/30/2019 14:51   DG Chest Port 1 View  Result Date: 03/30/2019 CLINICAL DATA:  Fever EXAM: PORTABLE CHEST 1 VIEW COMPARISON:  February 08, 2019, chest CT February 02, 2019 FINDINGS: The mediastinal contour and cardiac silhouette are stable. The aorta is tortuous. There is interval developed opacity of the right perihilar region. There is no pulmonary edema  or pleural effusion. Degenerative joint changes of bilateral shoulders are noted. IMPRESSION: New opacity of the right perihilar region. Further evaluation with a chest CT is recommended to exclude underlying mass. Electronically Signed   By: Abelardo Diesel M.D.   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  Continued cytopenias. Especially platelets.  Her fever is improved.  Will continue to watch, though very little to offer Recheck HIV RNA Available as needed on 04-01-19         Bobby Rumpf MD, FACP Infectious Diseases (pager) [phone] [email or web address] 03/31/2019, 1:03 PM  LOS: 2 days

## 2019-03-31 NOTE — Consult Note (Signed)
Consultation Note Date: 03/31/2019   Patient Name: Kristen Roman  DOB: [birth year]  MRN: [id number]  Age / Sex: 58 y.o., female  PCP: [facility] Referring Physician: Alma Friendly, MD  Reason for Consultation: Establishing goals of care  HPI/Patient Profile: 58 y.o. 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  She also has history of polysubstance use and HIV.  Patient remains admitted to hospital medicine service with GI as well as infectious disease services following for GI bleed, pancytopenia.  Pertinent hospital medical issues also include acute on chronic blood loss anemia, neutropenic fever with sepsis possible pulmonary source.  It is reported that the patient previously had hospice services and was living at home by herself.  She states she does not have a spouse.  She states she has children, does not tell me how many, states that they are grown and out of the house.  She states that her next of kin is her niece [name].  Palliative medicine service consultation continues for goals of care discussions.  Palliative medicine is specialized medical care for people living with serious illness. It focuses on providing relief from the symptoms and stress of a serious illness. The goal is to improve quality of life for both the patient and the family.  Goals of care: Broad aims of medical therapy in relation to the patient's values and preferences. Our aim is to provide medical care aimed at enabling patients to achieve the goals that matter most to them, given the circumstances of their particular medical situation and their constraints.   I  reviewed with the patient about the nature of my visit being to understand her and her medical conditions a little bit better and to be off assistance and to provide an additional layer of support.  Patient then agrees to continue with our encounter.  We discussed about her current pain and nonpain symptom management.  Patient states she has a headache.  Patient states she has itching on her scalp and request for some Benadryl.  Additionally, she asked the GI service for stool softeners, her bowel regimen has been adjusted by them.  Goals wishes and values important to the patient attempted to be explored.  Utilize this encounter for trust building and supportive listening.  Patient states she realizes she cannot live by herself in [place], [place].  She is agreeable to go to a facility.  We discussed about palliative services following her at a skilled nursing facility.  Discussed with her about the differences between hospice versus palliative services.  She is in agreement.  See below.  Thank you for the consult.  NEXT OF KIN Niece [name] is listed as next of kin [phone].  SUMMARY OF RECOMMENDATIONS   Agree with DO NOT RESUSCITATE. Recommend skilled nursing facility with palliative  care to follow.  At this time, patient states she is aware of the severity of her illnesses but does not want to reengage with hospice services.  Code Status/Advance Care Planning:  DNR    Symptom Management:   Medications reviewed and discussed with patient.  She is on transdermal fentanyl patch 25 mcg, oxycodone immediate release.  She is also on Celexa and trazodone.  Palliative Prophylaxis:   Bowel Regimen   Psycho-social/Spiritual:   Desire for further Chaplaincy support:yes  Additional Recommendations: Caregiving  Support/Resources  Prognosis:   Unable to determine  Discharge Planning: [facility] for rehab with Palliative care service follow-up       Primary Diagnoses: Present on Admission: . Symptomatic anemia . Anemia   I have reviewed the medical record, interviewed the patient and family, and examined the patient. The following aspects are pertinent.  Past Medical History:  Diagnosis Date  . Cancer ([place])    "bone cancer"  . Hemorrhoid   . HIV disease ([place])   . Hypertension    Social History   Socioeconomic History  . Marital status: Single    Spouse name: Not on file  . Number of children: Not on file  . Years of education: Not on file  . Highest education level: Not on file  Occupational History  . Occupation: n/a  Tobacco Use  . Smoking status: Current Every Day Smoker    Packs/day: 1.00    Years: 40.00    Pack years: 40.00    Types: Cigarettes    Start date: 04/06/1975  . Smokeless tobacco: Never Used  . Tobacco comment: pt. not ready to quit  Substance and Sexual Activity  . Alcohol use: Yes    Alcohol/week: 100.0 standard drinks    Types: 100 Standard drinks or equivalent per week    Comment: pt. states she is an alcoholic - 6-pack a day if she has the money  . Drug use: Yes    Frequency: 3.0 times per week    Types: Marijuana  . Sexual activity: Not Currently    Partners: Male    Comment: pt. given condoms  Other Topics Concern  . Not on file  Social History Narrative  . Not on file   Social Determinants of Health   Financial Resource Strain:   . Difficulty of Paying Living Expenses: Not on file  Food Insecurity:   . Worried About Charity fundraiser in the Last Year: Not on file  . Ran Out of Food in the Last Year: Not on file  Transportation Needs:   . Lack of Transportation (Medical): Not on file  . Lack of Transportation (Non-Medical): Not on file  Physical Activity:   . Days of Exercise per Week: Not on file  . Minutes of Exercise per Session: Not on file  Stress:   . Feeling of Stress : Not on file  Social Connections:   . Frequency of Communication with Friends and Family: Not  on file  . Frequency of Social Gatherings with Friends and Family: Not on file  . Attends Religious Services: Not on file  . Active Member of Clubs or Organizations: Not on file  . Attends Archivist Meetings: Not on file  . Marital Status: Not on file   Family History  Problem Relation Age of Onset  . Kidney disease Mother    Scheduled Meds: . sodium chloride   Intravenous Once  . bictegravir-emtricitabine-tenofovir AF  1 tablet Oral Daily  .  citalopram  10 mg Oral Daily  . dexamethasone  4 mg Oral q morning - 10a  . feeding supplement (ENSURE ENLIVE)  237 mL Oral BID BM  . fentaNYL  1 patch Transdermal Q72H  . folic acid  1 mg Oral Daily  . multivitamin with minerals  1 tablet Oral Daily  . pantoprazole (PROTONIX) IV  40 mg Intravenous Q12H  . polyethylene glycol  17 g Oral BID  . senna-docusate  2 tablet Oral BID  . sodium chloride flush  3 mL Intravenous Q12H   Continuous Infusions: . sodium chloride Stopped (03/30/19 2302)  . aztreonam 2 g (03/31/19 1133)  . vancomycin 1,250 mg (03/31/19 0553)   PRN Meds:.sodium chloride, acetaminophen **OR** acetaminophen, bisacodyl, diphenhydrAMINE, ondansetron **OR** ondansetron (ZOFRAN) IV, oxyCODONE, traZODone Medications Prior to Admission:  Prior to Admission medications   Medication Sig Start Date End Date Taking? Authorizing Provider  ACETAMINOPHEN EXTRA STRENGTH 500 MG tablet Take 500 mg by mouth every 4 (four) hours as needed for fever or pain. 03/12/19  Yes [provider]  bictegravir-emtricitabine-tenofovir AF (BIKTARVY) 50-200-25 MG TABS tablet Take 1 tablet by mouth daily.    Yes [provider]  dexamethasone (DECADRON) 4 MG tablet Take 4 mg by mouth every morning. 03/12/19  Yes [provider]  folic acid (FOLVITE) 1 MG tablet Take 1 tablet (1 mg total) by mouth daily. 02/12/19  Yes Bonnell Public, MD  Multiple Vitamin (MULTIVITAMIN WITH MINERALS) TABS tablet Take 1 tablet by mouth  daily. 02/11/19  Yes Dana Allan I, MD  omeprazole (PRILOSEC) 20 MG capsule Take 20 mg by mouth every morning. 03/12/19  Yes [provider]  Oxycodone HCl 10 MG TABS Take 10-15 mg by mouth every 4 (four) hours as needed for pain. 03/27/19  Yes [provider]  OXYCONTIN 15 MG 12 hr tablet Take 15 mg by mouth 2 (two) times daily. 03/27/19  Yes [provider]  SENNA-S 8.6-50 MG tablet Take 2 tablets by mouth 2 (two) times daily. 03/20/19  Yes [provider]  citalopram (CELEXA) 10 MG tablet TAKE 1 TABLET BY MOUTH EVERY DAY Patient taking differently: Take 10 mg by mouth daily.  02/21/14   Carlyle Basques, MD  feeding supplement, ENSURE ENLIVE, (ENSURE ENLIVE) LIQD Take 237 mLs by mouth 2 (two) times daily between meals. Patient not taking: Reported on 12/11/2018 11/09/18   Swayze, Ava, DO  ferrous sulfate 325 (65 FE) MG tablet Take 1 tablet (325 mg total) by mouth 2 (two) times daily with a meal. 02/03/19   Palumbo, April, MD  methadone (DOLOPHINE) 10 MG tablet Take 10 mg by mouth 2 (two) times daily. 01/15/19   [provider]   Allergies  Allergen Reactions  . Amoxicillin Itching  . Penicillin G Itching    Did it involve swelling of the face/tongue/throat, SOB, or low BP? No Did it involve sudden or severe rash/hives, skin peeling, or any reaction on the inside of your mouth or nose? No Did you need to seek medical attention at a hospital or doctor's office? yes When did it last happen?recently If all above answers are "NO", may proceed with cephalosporin use.   Review of Systems Patient complains of headache, complains of itching Physical Exam Patient has a facial droop Awake alert sitting up in bed Responds appropriately Lungs clear to auscultation Abdomen is soft nondistended Does not have edema S1-S2   Vital Signs: BP (!) 155/91   Pulse 65   Temp 97.9 F (  36.6 C) (Oral)   Resp 18   Ht 5' 4.96" (1.65 m)   Wt 60 kg   SpO2  100%   BMI 22.04 kg/m  Pain Scale: 0-10   Pain Score: 9    SpO2: SpO2: 100 % O2 Device:SpO2: 100 % O2 Flow Rate: .   IO: Intake/output summary:   Intake/Output Summary (Last 24 hours) at 03/31/2019 1241 Last data filed at 03/31/2019 0900 Gross per 24 hour  Intake 1074.01 ml  Output 2100 ml  Net -1025.99 ml    LBM: Last BM Date: 03/29/19 Baseline Weight: Weight: 60 kg Most recent weight: Weight: 60 kg     Palliative Assessment/Data:   PPS 50%  Time In:  10 Time Out:  11 Time Total:  60  Greater than 50%  of this time was spent counseling and coordinating care related to the above assessment and plan.  Signed by: Loistine Chance, MD   Please contact Palliative Medicine Team phone at [phone] for questions and concerns.  For individual provider: See Shea Evans

## 2019-03-31 NOTE — Progress Notes (Signed)
PT Cancellation Note  Patient Details Name: Kristen Roman MRN: [id number] DOB: Aug 08, 1960   Cancelled Treatment:     PT attempted this date - deferred in am with platelet transfusion running; pt declines in pm but agreeable to attempt in am.  Will follow.   Nyla Creason 03/31/2019, 4:20 PM

## 2019-03-31 NOTE — Progress Notes (Addendum)
Daily Rounding Note  03/31/2019, 9:59 AM  LOS: 2 days   SUBJECTIVE:   Chief complaint:   FOBT + stool.  Severe Pancytopenia. No GI complaints other than that she is not had a bowel movement for at least a couple of days and would like some sort of a laxative.  Stool softener was highly effective for her in the past.  Also continues to have anorexia but no nausea or vomiting. Scalp is pruritic and she is asking for Benadryl.  OBJECTIVE:         Vital signs in last 24 hours:    Temp:  [97.9 F (36.6 C)-100.4 F (38 C)] 98.9 F (37.2 C) (12/26 0549) Pulse Rate:  [66-102] 66 (12/26 0549) Resp:  [16-18] 18 (12/26 0549) BP: (134-163)/(77-110) 162/95 (12/26 0549) SpO2:  [98 %-100 %] 100 % (12/26 0549) Last BM Date: 03/29/19 Filed Weights   03/29/19 2100  Weight: 60 kg   General: Pleasant, does not look terminally/acutely ill. Heart: RRR. Chest: No labored breathing or cough.  Clear in the front. Abdomen: Soft.  Not tender, not distended.  No HSM, masses, bruits, hernias. Extremities: No CCE. Neuro/Psych: Alert, pleasant, cooperative.  Not confused.  Intake/Output from previous day: 12/25 0701 - 12/26 0700 In: 954 [I.V.:85; Blood:669; IV Piggyback:200] Out: 1800 [Urine:1800]  Intake/Output this shift: Total I/O In: 120 [P.O.:120] Out: 300 [Urine:300]  Lab Results: Recent Labs    03/30/19 1951 03/30/19 2157 03/31/19 0608  WBC 3.3* 3.4* 3.6*  HGB 6.6* 6.0* 8.0*  HCT 21.4* 19.5* 25.6*  PLT 15* 14* 13*   BMET Recent Labs    03/29/19 1247 03/30/19 0153 03/31/19 0608  NA 131* 134* 131*  K 3.9 4.0 4.1  CL 99 99 97*  CO2 22 26 23   GLUCOSE 85 88 87  BUN 13 14 15   CREATININE 0.48 0.45 0.51  CALCIUM 8.5* 8.8* 8.9   LFT No results for input(s): PROT, ALBUMIN, AST, ALT, ALKPHOS, BILITOT, BILIDIR, IBILI in the last 72 hours. PT/INR Recent Labs    03/29/19 2256  LABPROT 13.6  INR 1.1   Hepatitis  Panel No results for input(s): HEPBSAG, HCVAB, HEPAIGM, HEPBIGM in the last 72 hours.  Studies/Results: CT Head Wo Contrast  Result Date: 03/29/2019 CLINICAL DATA:  History of radiation therapy for a right parotid tumor. Primary of bone destruction at the skull base with a soft tissue component on recent MRI, suspicious for metastatic disease. The patient also has dural enhancement and dural based mass in the left middle cranial fossa on the recent MRI. No reported symptoms at this time. EXAM: CT HEAD WITHOUT CONTRAST TECHNIQUE: Contiguous axial images were obtained from the base of the skull through the vertex without intravenous contrast. COMPARISON:  Brain MR dated 03/15/2019. Head CT dated 02/06/2019. FINDINGS: Brain: Stable mildly enlarged ventricles and cortical sulci. Oval area of low density in the left temporal lobe in the middle cranial fossa, corresponding to the mass seen on the recent MRI. Calcification in the pituitary gland with no pituitary mass seen on the recent MR. No intracranial hemorrhage or CT evidence of acute infarction. Vascular: No hyperdense vessel or unexpected calcification. Skull: Previously demonstrated permeative bone destruction at the skull base is again demonstrated, limited by motion artifacts on the current images. Again demonstrated is opacification of the mastoid air cells on the right. Sinuses/Orbits: The included portions are unremarkable, limited by motion artifacts. Right mastoid air cell opacification with mild progression since  02/06/2019. Other: None. IMPRESSION: 1. Limited examination due to motion artifacts with no acute abnormality seen. 2. Grossly stable left temporal lobe mass, compatible with the patient's known tumor. 3. Grossly stable permeative bone destruction at the skull base and mildly progressive opacification of the right mastoid air cells, suspicious for metastatic disease. 4. Pituitary gland calcifications, possibly due to the patient's previous  radiation therapy. 5. Stable mild diffuse cerebral and cerebellar atrophy. Electronically Signed   By: Claudie Revering M.D.   On: 03/29/2019 14:40   CT ANGIO CHEST PE W OR WO CONTRAST  Result Date: 03/30/2019 CLINICAL DATA:  Shortness of breath. Evaluate for pulmonary embolus. EXAM: CT ANGIOGRAPHY CHEST WITH CONTRAST TECHNIQUE: Multidetector CT imaging of the chest was performed using the standard protocol during bolus administration of intravenous contrast. Multiplanar CT image reconstructions and MIPs were obtained to evaluate the vascular anatomy. CONTRAST:  46mL OMNIPAQUE IOHEXOL 350 MG/ML SOLN COMPARISON:  02/03/2019 FINDINGS: Cardiovascular: Heart is enlarged. Coronary artery calcification is evident. Atherosclerotic calcification is noted in the wall of the thoracic aorta. No filling defect within the opacified pulmonary arteries to suggest the presence of an acute pulmonary embolus Mediastinum/Nodes: No mediastinal lymphadenopathy. There is no hilar lymphadenopathy. The esophagus has normal imaging features. There is no axillary lymphadenopathy. Lungs/Pleura: Bandlike areas of subpleural opacity are seen in the lower lungs bilaterally, right greater than left. This is likely atelectatic although component of infectious/inflammatory etiology not excluded. No pneumothorax or pleural effusion. Upper Abdomen: Unremarkable. Musculoskeletal: The widespread bony metastatic disease is similar in the interval. Review of the MIP images confirms the above findings. IMPRESSION: 1. No CT evidence for acute pulmonary embolus. 2. Areas of subpleural bandlike consolidation in the lower lungs, right greater than left. This is probably mainly atelectatic although component of underlying infectious/inflammatory etiology not entirely excluded. 3. Similar appearance widespread bony metastatic disease. Electronically Signed   By: Misty Stanley M.D.   On: 03/30/2019 14:51   DG Chest Port 1 View  Result Date:  03/30/2019 CLINICAL DATA:  Fever EXAM: PORTABLE CHEST 1 VIEW COMPARISON:  February 08, 2019, chest CT February 02, 2019 FINDINGS: The mediastinal contour and cardiac silhouette are stable. The aorta is tortuous. There is interval developed opacity of the right perihilar region. There is no pulmonary edema or pleural effusion. Degenerative joint changes of bilateral shoulders are noted. IMPRESSION: New opacity of the right perihilar region. Further evaluation with a chest CT is recommended to exclude underlying mass. Electronically Signed   By: Abelardo Diesel M.D.   On: 03/30/2019 08:18   Scheduled Meds: . sodium chloride   Intravenous Once  . bictegravir-emtricitabine-tenofovir AF  1 tablet Oral Daily  . citalopram  10 mg Oral Daily  . dexamethasone  4 mg Oral q morning - 10a  . feeding supplement (ENSURE ENLIVE)  237 mL Oral BID BM  . fentaNYL  1 patch Transdermal Q72H  . ferrous sulfate  325 mg Oral BID WC  . folic acid  1 mg Oral Daily  . multivitamin with minerals  1 tablet Oral Daily  . pantoprazole  40 mg Oral Daily  . senna-docusate  2 tablet Oral BID  . sodium chloride flush  3 mL Intravenous Q12H   Continuous Infusions: . sodium chloride Stopped (03/30/19 2302)  . aztreonam 2 g (03/31/19 0335)  . vancomycin 1,250 mg (03/31/19 0553)   PRN Meds:.sodium chloride, acetaminophen **OR** acetaminophen, bisacodyl, ondansetron **OR** ondansetron (ZOFRAN) IV, oxyCODONE, polyethylene glycol, traZODone  ASSESMENT:   *  FOBT positive stool,  Not a candidate for endoscopic procedures given platelets of 13 K.Marland Kitchen po Protonix in place.  Takes omeprazole 20 mg/day PTA  *    Severe pancytopenia due to bone marrow involvement of HIV. Empiric antibiotics in place. anemia with Hgb 5.3 >> 2 PRBCs >> 8, baseline 8. Bid oral iron added  *    Stage 4 parotid adenocarcinoma with bone and brain mets.  Under hospice care at home.  Decadron in place.  *    Constipation.  She is receiving Senokot 2 p.o.  bid.  MiraLAX prn but has not yet received this.  *    HIV.  *    Pulmonary consolidation.  *    Hyponatremia.   PLAN   *   Switch to Protonix 40 IV bid for now.  Change MiraLAX to bid scheduled rather than prn    Kristen Roman  03/31/2019, 9:59 AM Phone [phone]    Attending physician's note   I have reviewed the chart, didn't examined the patient (she didn't want to be disturbed). I agree with the Advanced Practitioner's note, impression and recommendations.   I have also reviewed Dr. Antonieta Pert note.  Very poor prognosis.  No GI intervention planned.  At times GI bleed could be a terminal event.  Would suggest supportive care only.  We will sign off for now.  Carmell Austria, MD Velora Heckler Fabienne Bruns [phone].

## 2019-03-31 NOTE — Progress Notes (Signed)
PROGRESS NOTE  Kristen Roman DOB: 11-28-1960 DOA: 03/29/2019 PCP: [facility]  HPI/Recap of past 24 hours: HPI from Kristen Roman Kristen Roman is a 58 y.o. 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 Of note, patient recently discontinued hospice, has poor insight to medical problems and a poor historian. Patient is a reluctant historian with unclear insight to her condition. Pt also reports intermittent blood in her stool over the last week, unable to give further info. In the ED, HD stable except for some mild tachycardia, lab work notable for pancytopenia with a significant drop in hemoglobin to 5.3, FOBT positive, CT head with no significant acute changes.  COVID-19 negative.  Patient admitted for further management.    Today, patient reported feeling constipated, noted to have nosebleeds.  Denies any other new complaints.  Very poor prognosis.     Assessment/Plan: Active Problems:   Symptomatic anemia   Anemia   Pressure injury of skin   Pancytopenia (HCC)   Heme positive stool  Acute on chronic blood loss anemia likely 2/2 GI bleed Hemoglobin 5.3 on admission, baseline around 8 FOBT positive S/P 2 units PRBC Continue Protonix GI consulted, no further work-up due to very poor prognosis Daily CBC  Neutropenic fever with possible sepsis likely 2/2 HCAP, rule out PJP Vs bacteremia Vs cryptococcus Currently afebrile, last fever was on 03/30/2019, with leukopenia BC x2 NGTD LA 0.9 Procalcitonin 0.23 UA unremarkable Chest x-ray showed possible new opacity of the right perihilar region, further evaluation with a CT chest is recommended to exclude underlying mass CTA chest no evidence of PE, possible consolidation in lower lungs right greater than left, widespread  bony metastatic disease ID consulted, spoke to Kristen. Johnnye Sima, recommend induced sputum for PJP, cryptococcus antigen, continue antibiotics for now Cryptococcus antigen negative Continue IV vancomycin, aztreonam  Chronic pancytopenia/severe thrombocytopenia Likely related to malignancy as well as HIV Will transfuse 1 unit of platelets on 03/31/2019 Heme-onc consulted, no further intervention, recommend hospice Daily CBC  HIV CD4 count 150 on 02/07/2019 ?? 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   Weight as of this encounter: 60 kg.     Code Status: DNR  Family Communication: None at bedside  Disposition Plan: SNF   Consultants:  GI  ID  Heme-onc  Procedures:  None  Antimicrobials:  Vancomycin  Aztreonam  DVT prophylaxis: SCDs   Objective: Vitals:   03/31/19 1144 03/31/19 1242 03/31/19 1258 03/31/19 1510  BP: (!) 155/91 (!) 159/79 (!) 165/79 (!) 157/93  Pulse: 65 66 64 64  Resp:  20 16   Temp:  98.8 F (37.1 C) 98.2 F (36.8 C) 98.7 F (37.1 C)  TempSrc:  Oral Oral Oral  SpO2:  100% 100% 100%  Weight:      Height:        Intake/Output Summary (Last 24 hours) at 03/31/2019 1742 Last data filed at 03/31/2019 1503 Gross per 24 hour  Intake 1516.51 ml  Output 2100 ml  Net -583.49 ml   Filed Weights    03/29/19 2100  Weight: 60 kg    Exam:  General: NAD, chronic right-sided facial droop  Cardiovascular: S1, S2 present  Respiratory: CTAB  Abdomen: Soft, nontender, nondistended, bowel sounds present  Musculoskeletal: No bilateral pedal edema noted  Skin: Normal  Psychiatry:  Fair mood, occasionally agitated    Data Reviewed: CBC: Recent Labs  Lab 03/29/19 1247 03/29/19 2256 03/30/19 1055 03/30/19 1951 03/30/19 2157 03/31/19 0608  WBC 3.8* 3.4* 2.6* 3.3* 3.4* 3.6*  NEUTROABS 1.5*  --   --   --   --  1.4*  HGB 5.3* 5.4* 4.3* 6.6* 6.0* 8.0*  HCT 17.6* 18.5* 14.8* 21.4* 19.5* 25.6*  MCV 82.2 84.5 83.6 86.3 85.5 85.3  PLT 23* 32* 20* 15* 14* 13*   Basic Metabolic Panel: Recent Labs  Lab 03/29/19 1247 03/30/19 0153 03/31/19 0608  NA 131* 134* 131*  K 3.9 4.0 4.1  CL 99 99 97*  CO2 22 26 23   GLUCOSE 85 88 87  BUN 13 14 15   CREATININE 0.48 0.45 0.51  CALCIUM 8.5* 8.8* 8.9   GFR: Estimated Creatinine Clearance: 68.9 mL/min (by C-G formula based on SCr of 0.51 mg/dL). Liver Function Tests: Recent Labs  Lab 03/31/19 0922  AST 19  ALT 9  ALKPHOS 78  BILITOT 0.2*  PROT 5.5*  ALBUMIN 2.6*   No results for input(s): LIPASE, AMYLASE in the last 168 hours. No results for input(s): AMMONIA in the last 168 hours. Coagulation Profile: Recent Labs  Lab 03/29/19 2256  INR 1.1   Cardiac Enzymes: No results for input(s): CKTOTAL, CKMB, CKMBINDEX, TROPONINI in the last 168 hours. BNP (last 3 results) No results for input(s): PROBNP in the last 8760 hours. HbA1C: No results for input(s): HGBA1C in the last 72 hours. CBG: No results for input(s): GLUCAP in the last 168 hours. Lipid Profile: No results for input(s): CHOL, HDL, LDLCALC, TRIG, CHOLHDL, LDLDIRECT in the last 72 hours. Thyroid Function Tests: No results for input(s): TSH, T4TOTAL, FREET4, T3FREE, THYROIDAB in the last 72 hours. Anemia Panel: Recent Labs    03/30/19 1055  FERRITIN 784*    Urine analysis:    Component Value Date/Time   COLORURINE YELLOW 03/30/2019 0600   APPEARANCEUR CLEAR 03/30/2019 0600   LABSPEC 1.013 03/30/2019 0600   PHURINE 6.0 03/30/2019 0600   GLUCOSEU NEGATIVE 03/30/2019 0600   GLUCOSEU NEG mg/dL 12/29/2006 2001   HGBUR NEGATIVE 03/30/2019 0600   BILIRUBINUR NEGATIVE 03/30/2019 0600   KETONESUR NEGATIVE 03/30/2019 0600   PROTEINUR NEGATIVE 03/30/2019 0600   UROBILINOGEN 1 12/29/2006 2001   NITRITE NEGATIVE 03/30/2019 0600   LEUKOCYTESUR NEGATIVE 03/30/2019 0600   Sepsis Labs: @LABRCNTIP (procalcitonin:4,lacticidven:4)  ) Recent Results (from the past 240 hour(s))  SARS CORONAVIRUS 2 (TAT 6-24 HRS) Nasopharyngeal Nasopharyngeal Swab     Status: None   Collection Time: 03/29/19  2:27 PM   Specimen: Nasopharyngeal Swab  Result Value Ref Range Status   SARS Coronavirus 2 NEGATIVE NEGATIVE Final    Comment: (NOTE) SARS-CoV-2 target nucleic acids are NOT DETECTED. The SARS-CoV-2 RNA is generally detectable in upper and lower respiratory specimens during the acute phase of infection. Negative results do not preclude SARS-CoV-2  infection, do not rule out co-infections with other pathogens, and should not be used as the sole basis for treatment or other patient management decisions. Negative results must be combined with clinical observations, patient history, and epidemiological information. The expected result is Negative. Fact Sheet for Patients: SugarRoll.be Fact Sheet for Healthcare Providers: https://www.woods-mathews.com/ This test is not yet approved or cleared by the [place] FDA and  has been authorized for detection and/or diagnosis of SARS-CoV-2 by FDA under an Emergency Use Authorization (EUA). This EUA will remain  in effect (meaning this test can be used) for the duration of the COVID-19 declaration under Section 56 4(b)(1) of the Act, 21 U.S.C. section 360bbb-3(b)(1), unless  the authorization is terminated or revoked sooner. Performed at [facility], [place] [address], [place], [place] [address]   Culture, blood (routine x 2)     Status: None (Preliminary result)   Collection Time: 03/30/19  1:53 AM   Specimen: BLOOD RIGHT HAND  Result Value Ref Range Status   Specimen Description   Final    BLOOD RIGHT HAND Performed at [facility] [address] [place], [place] [address]    Special Requests   Final    BOTTLES DRAWN AEROBIC AND ANAEROBIC Blood Culture adequate volume Performed at [facility] [address], [place], [place] [address]    Culture   Final    NO GROWTH 1 DAY Performed at [facility], [place] [address], [place], [place] [address]    Report Status PENDING  Incomplete  Culture, blood (routine x 2)     Status: None (Preliminary result)   Collection Time: 03/30/19  1:53 AM   Specimen: BLOOD LEFT HAND  Result Value Ref Range Status   Specimen Description   Final    BLOOD LEFT HAND Performed at [facility] [address] [place], [place] [address]    Special Requests   Final    BOTTLES DRAWN AEROBIC ONLY Blood Culture adequate volume Performed at [facility] [address] [place], [place] [address]    Culture   Final    NO GROWTH 1 DAY Performed at [facility], [place] [address], [place],  [address]    Report Status PENDING  Incomplete      Studies: No results found.  Scheduled Meds: . sodium chloride   Intravenous Once  . bictegravir-emtricitabine-tenofovir AF  1 tablet Oral Daily  . citalopram  10 mg Oral Daily  . dexamethasone  4 mg Oral q morning - 10a  . feeding supplement (ENSURE ENLIVE)  237 mL Oral BID BM  . fentaNYL  1 patch Transdermal Q72H  . folic acid  1 mg Oral Daily  . multivitamin with minerals  1 tablet Oral Daily  . pantoprazole (PROTONIX) IV  40 mg Intravenous Q12H  . polyethylene glycol  17 g Oral  BID  . senna-docusate  2 tablet Oral BID  . sodium chloride flush  3 mL Intravenous Q12H    Continuous Infusions: . sodium chloride Stopped (03/30/19 2302)  . aztreonam 2 g (03/31/19 1133)  . vancomycin 1,250 mg (03/31/19 0553)     LOS: 2 days     Alma Friendly, MD [facility]  If 7PM-7AM, please contact night-coverage [email or web address] 03/31/2019, 5:42 PM

## 2019-03-31 NOTE — Progress Notes (Signed)
rm [id number], Kristen Roman,Cancer bone mets.  Pt has been hypertensive, BP 182/102 pulse 68 rechecked BP 174/88 P 59  tonight

## 2019-04-01 DIAGNOSIS — D630 Anemia in neoplastic disease: Secondary | ICD-10-CM

## 2019-04-01 LAB — CBC WITH DIFFERENTIAL/PLATELET
Abs Immature Granulocytes: 0.47 10*3/uL — ABNORMAL HIGH (ref 0.00–0.07)
Basophils Absolute: 0 10*3/uL (ref 0.0–0.1)
Basophils Relative: 1 %
Eosinophils Absolute: 0 10*3/uL (ref 0.0–0.5)
Eosinophils Relative: 0 %
HCT: 26.1 % — ABNORMAL LOW (ref 36.0–46.0)
Hemoglobin: 8.2 g/dL — ABNORMAL LOW (ref 12.0–15.0)
Immature Granulocytes: 12 %
Lymphocytes Relative: 35 %
Lymphs Abs: 1.4 10*3/uL (ref 0.7–4.0)
MCH: 26.5 pg (ref 26.0–34.0)
MCHC: 31.4 g/dL (ref 30.0–36.0)
MCV: 84.2 fL (ref 80.0–100.0)
Monocytes Absolute: 0.5 10*3/uL (ref 0.1–1.0)
Monocytes Relative: 13 %
Neutro Abs: 1.6 10*3/uL — ABNORMAL LOW (ref 1.7–7.7)
Neutrophils Relative %: 39 %
Platelets: 32 10*3/uL — ABNORMAL LOW (ref 150–400)
RBC: 3.1 MIL/uL — ABNORMAL LOW (ref 3.87–5.11)
RDW: 17 % — ABNORMAL HIGH (ref 11.5–15.5)
WBC: 4 10*3/uL (ref 4.0–10.5)
nRBC: 8 % — ABNORMAL HIGH (ref 0.0–0.2)

## 2019-04-01 LAB — BASIC METABOLIC PANEL
Anion gap: 16 — ABNORMAL HIGH (ref 5–15)
BUN: 15 mg/dL (ref 6–20)
CO2: 23 mmol/L (ref 22–32)
Calcium: 8.3 mg/dL — ABNORMAL LOW (ref 8.9–10.3)
Chloride: 95 mmol/L — ABNORMAL LOW (ref 98–111)
Creatinine, Ser: 0.43 mg/dL — ABNORMAL LOW (ref 0.44–1.00)
GFR calc Af Amer: >60 mL/min (ref >60)
GFR calc non Af Amer: >60 mL/min (ref >60)
Glucose, Bld: 88 mg/dL (ref 70–99)
Potassium: 3.6 mmol/L (ref 3.5–5.1)
Sodium: 134 mmol/L — ABNORMAL LOW (ref 135–145)

## 2019-04-01 LAB — HAPTOGLOBIN: Haptoglobin: 205 mg/dL (ref 33–346)

## 2019-04-01 LAB — PROCALCITONIN: Procalcitonin: <0.1 ng/mL

## 2019-04-01 MED ORDER — AMLODIPINE BESYLATE 5 MG PO TABS
5.0000 mg | ORAL_TABLET | Freq: Every day | ORAL | Status: DC
  Administered 2019-04-01 – 2019-04-23 (×23): 5 mg via ORAL
  Filled 2019-04-01 (×24): qty 1

## 2019-04-01 MED ORDER — TRAZODONE HCL 50 MG PO TABS
50.0000 mg | ORAL_TABLET | Freq: Once | ORAL | Status: AC
  Administered 2019-04-01: 12:00:00 50 mg via ORAL
  Filled 2019-04-01: qty 1

## 2019-04-01 MED ORDER — BOOST / RESOURCE BREEZE PO LIQD CUSTOM
1.0000 | Freq: Two times a day (BID) | ORAL | Status: DC
  Administered 2019-04-01 – 2019-04-09 (×8): 1 via ORAL
  Administered 2019-04-10: 237 mL via ORAL
  Administered 2019-04-13 – 2019-04-17 (×4): 1 via ORAL

## 2019-04-01 MED ORDER — MORPHINE SULFATE (PF) 2 MG/ML IV SOLN
2.0000 mg | Freq: Once | INTRAVENOUS | Status: AC
  Administered 2019-04-01: 20:00:00 2 mg via INTRAVENOUS
  Filled 2019-04-01: qty 1

## 2019-04-01 MED ORDER — HYDRALAZINE HCL 25 MG PO TABS
25.0000 mg | ORAL_TABLET | Freq: Three times a day (TID) | ORAL | Status: DC | PRN
  Administered 2019-04-02 – 2019-04-13 (×3): 25 mg via ORAL
  Filled 2019-04-01 (×4): qty 1

## 2019-04-01 NOTE — Progress Notes (Signed)
PT Cancellation Note  Patient Details Name: Kristen Roman MRN: [id number] DOB: 09-Dec-1960   Cancelled Treatment:     PT deferred this am 2* pt fatigue and pain.  Will follow.   [name] 04/01/2019, 1:09 PM

## 2019-04-01 NOTE — Progress Notes (Signed)
Initial Nutrition Assessment  INTERVENTION:   -D/c Ensure supplements -Boost Breeze po BID, each supplement provides 250 kcal and 9 grams of protein  NUTRITION DIAGNOSIS:   Inadequate oral intake related to chronic illness as evidenced by meal completion < 50%.  GOAL:   Patient will meet greater than or equal to 90% of their needs  MONITOR:   PO intake, Supplement acceptance, Labs, Weight trends, I & O's, Skin  REASON FOR ASSESSMENT:   Consult Assessment of nutrition requirement/status  ASSESSMENT:   58 y.o. 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 Of note, patient recently discontinued hospice, has poor insight to medical problems and a poor historian. Patient is a reluctant historian with unclear insight to her condition. Pt also reports intermittent blood in her stool over the last week, unable to give further info.  12/24: admitted for anemia 2/2 GI bleed  **RD working remotely**  Patient reports poor appetite for a few months now. Pt has also been constipated.  Pt with metastatic adenocarcinoma of the parotid gland, not currently on any treatments. Pt recently on hospice but now per Palliative care note, pt declining hospice and wants to d/c to SNF with Palliative follow-up. Per oncology note, pt with poor prognosis (~1 month).  Pt currently consuming 0-30% of meals. Pt is not drinking Ensure supplements. Will switch to [facility] but not confident pt will accept these either.  Per weight records, pt has lost 20 lbs since 8/6 (13% wt loss x 4.5 months, significant for time frame).  I/Os: -676 ml since admit UOP: 800 ml x 24 hrs  Medications: Folic acid tablet, Multivitamin with minerals daily Labs reviewed: Low Na  NUTRITION - FOCUSED PHYSICAL EXAM:  Working remotely.  Diet  Order:   Diet Order            Diet regular Room service appropriate? Yes; Fluid consistency: Thin  Diet effective now              EDUCATION NEEDS:   No education needs have been identified at this time  Skin:  Skin Assessment: Skin Integrity Issues: Skin Integrity Issues:: Stage II Stage II: coccyx  Last BM:  12/24  Height:   Ht Readings from Last 1 Encounters:  03/29/19 5' 4.96" (1.65 m)    Weight:   Wt Readings from Last 1 Encounters:  03/29/19 60 kg    Ideal Body Weight:  56.8 kg  BMI:  Body mass index is 22.04 kg/m.  Estimated Nutritional Needs:   Kcal:  1500-1700  Protein:  70-80g  Fluid:  1.7L/day  Clayton Bibles, MS, RD, LDN Inpatient Clinical Dietitian Pager: [phone] After Hours Pager: [phone]

## 2019-04-01 NOTE — Progress Notes (Signed)
rm 1406, Kristen Roman, 75, Mets [place].  Again BP171/103. P 87. No PRN or antihypertensive meds with parameters.

## 2019-04-01 NOTE — Progress Notes (Signed)
PROGRESS NOTE  Kristen Roman [id number] DOB: 06-17-1960 DOA: 03/29/2019 PCP: [facility]  HPI/Recap of past 24 hours: HPI from Dr Mindi Junker Kristen Roman is a 58 y.o. 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 Of note, patient recently discontinued hospice, has poor insight to medical problems and a poor historian. Patient is a reluctant historian with unclear insight to her condition. Pt also reports intermittent blood in her stool over the last week, unable to give further info. In the ED, HD stable except for some mild tachycardia, lab work notable for pancytopenia with a significant drop in hemoglobin to 5.3, FOBT positive, CT head with no significant acute changes.  COVID-19 negative.  Patient admitted for further management.    Today, patient reported feeling tired, did not sleep well overnight.  Denies any chest pain, abdominal pain, nausea/vomiting, fever/chills.     Assessment/Plan: Active Problems:   Symptomatic anemia   Anemia   Pressure injury of skin   Pancytopenia (HCC)   Heme positive stool  Acute on chronic blood loss anemia likely 2/2 GI bleed Hemoglobin 5.3 on admission, baseline around 8, hemoglobin currently stable FOBT positive S/P 2 units PRBC Continue Protonix GI consulted, no further work-up due to very poor prognosis Daily CBC  Neutropenic fever with possible sepsis likely 2/2 HCAP, rule out PJP Vs bacteremia Vs cryptococcus Currently afebrile, last fever was on 03/30/2019, with leukopenia BC x2 NGTD LA 0.9 Procalcitonin 0.23 UA unremarkable Chest x-ray showed possible new opacity of the right perihilar region, further evaluation with a CT chest is recommended to exclude underlying mass CTA chest no evidence of PE, possible consolidation in lower  lungs right greater than left, widespread bony metastatic disease ID on board, appreciate recs Cryptococcus antigen negative Continue IV vancomycin, aztreonam  Chronic pancytopenia/severe thrombocytopenia Likely related to malignancy as well as HIV Will transfuse 1 unit of platelets on 03/31/2019 Heme-onc consulted, no further intervention, recommend hospice Daily CBC  HIV CD4 count 150 on 02/07/2019 ?? 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 No longer interested in hospice as she did not like the way she was treated Continue home Decadron to reduce neurogenic edema Pain management Consult palliative care, recommended discharge to SNF, patient continues to refuse hospice  Bipolar disorder Continue home Celexa        Malnutrition Type:  Nutrition Problem: Inadequate oral intake Etiology: chronic illness   Malnutrition Characteristics:  Signs/Symptoms: meal completion < 50%   Nutrition Interventions:  Interventions: Boost Breeze    Estimated body mass index is 22.04 kg/m as calculated from the following:   Height as of this encounter: 5' 4.96" (1.65 m).   Weight as of this encounter: 60 kg.     Code Status: DNR  Family Communication: None at bedside  Disposition Plan: SNF   Consultants:  GI  ID  Heme-onc  Procedures:  None  Antimicrobials:  Vancomycin  Aztreonam  DVT prophylaxis: SCDs   Objective: Vitals:   03/31/19 1956 03/31/19 2241 04/01/19 0452 04/01/19 0904  BP: (!) 182/102 (!) 175/88 (!) 171/103 (!) 142/89  Pulse: 62 (!) 58 84 87  Resp: 16  16   Temp: 98 F (36.7 C)     TempSrc: Oral     SpO2: 99%  100%  100%  Weight:      Height:        Intake/Output Summary (Last 24 hours) at 04/01/2019 1449 Last data filed at 04/01/2019 0919 Gross per 24 hour  Intake 779.5 ml  Output 500 ml  Net  279.5 ml   Filed Weights   03/29/19 2100  Weight: 60 kg    Exam:  General: NAD, chronic right-sided facial droop  Cardiovascular: S1, S2 present  Respiratory: CTAB  Abdomen: Soft, nontender, nondistended, bowel sounds present  Musculoskeletal: No bilateral pedal edema noted  Skin: Normal  Psychiatry:  Fair mood, intermittently agitated   Data Reviewed: CBC: Recent Labs  Lab 03/29/19 1247 03/30/19 1055 03/30/19 1951 03/30/19 2157 03/31/19 0608 04/01/19 0539  WBC 3.8* 2.6* 3.3* 3.4* 3.6* 4.0  NEUTROABS 1.5*  --   --   --  1.4* 1.6*  HGB 5.3* 4.3* 6.6* 6.0* 8.0* 8.2*  HCT 17.6* 14.8* 21.4* 19.5* 25.6* 26.1*  MCV 82.2 83.6 86.3 85.5 85.3 84.2  PLT 23* 20* 15* 14* 13* 32*   Basic Metabolic Panel: Recent Labs  Lab 03/29/19 1247 03/30/19 0153 03/31/19 0608 04/01/19 0539  NA 131* 134* 131* 134*  K 3.9 4.0 4.1 3.6  CL 99 99 97* 95*  CO2 22 26 23 23   GLUCOSE 85 88 87 88  BUN 13 14 15 15   CREATININE 0.48 0.45 0.51 0.43*  CALCIUM 8.5* 8.8* 8.9 8.3*   GFR: Estimated Creatinine Clearance: 68.9 mL/min (A) (by C-G formula based on SCr of 0.43 mg/dL (L)). Liver Function Tests: Recent Labs  Lab 03/31/19 0922  AST 19  ALT 9  ALKPHOS 78  BILITOT 0.2*  PROT 5.5*  ALBUMIN 2.6*   No results for input(s): LIPASE, AMYLASE in the last 168 hours. No results for input(s): AMMONIA in the last 168 hours. Coagulation Profile: Recent Labs  Lab 03/29/19 2256  INR 1.1   Cardiac Enzymes: No results for input(s): CKTOTAL, CKMB, CKMBINDEX, TROPONINI in the last 168 hours. BNP (last 3 results) No results for input(s): PROBNP in the last 8760 hours. HbA1C: No results for input(s): HGBA1C in the last 72 hours. CBG: No results for input(s): GLUCAP in the last 168 hours. Lipid Profile: No results for input(s): CHOL, HDL, LDLCALC, TRIG, CHOLHDL, LDLDIRECT in the last 72 hours. Thyroid Function Tests: No results for input(s): TSH, T4TOTAL, FREET4, T3FREE, THYROIDAB in the  last 72 hours. Anemia Panel: Recent Labs    03/30/19 1055  FERRITIN 784*   Urine analysis:    Component Value Date/Time   COLORURINE YELLOW 03/30/2019 0600   APPEARANCEUR CLEAR 03/30/2019 0600   LABSPEC 1.013 03/30/2019 0600   PHURINE 6.0 03/30/2019 0600   GLUCOSEU NEGATIVE 03/30/2019 0600   GLUCOSEU NEG mg/dL 12/29/2006 2001   HGBUR NEGATIVE 03/30/2019 0600   BILIRUBINUR NEGATIVE 03/30/2019 0600   KETONESUR NEGATIVE 03/30/2019 0600   PROTEINUR NEGATIVE 03/30/2019 0600   UROBILINOGEN 1 12/29/2006 2001   NITRITE NEGATIVE 03/30/2019 0600   LEUKOCYTESUR NEGATIVE 03/30/2019 0600   Sepsis Labs: @LABRCNTIP (procalcitonin:4,lacticidven:4)  ) Recent Results (from the past 240 hour(s))  SARS CORONAVIRUS 2 (TAT 6-24 HRS) Nasopharyngeal Nasopharyngeal Swab     Status: None   Collection Time: 03/29/19  2:27 PM   Specimen: Nasopharyngeal Swab  Result Value Ref Range Status   SARS Coronavirus 2 NEGATIVE NEGATIVE Final    Comment: (NOTE) SARS-CoV-2 target nucleic acids are NOT DETECTED. The SARS-CoV-2 RNA is generally detectable in upper and lower respiratory specimens during the acute phase of infection. Negative  results do not preclude SARS-CoV-2 infection, do not rule out co-infections with other pathogens, and should not be used as the sole basis for treatment or other patient management decisions. Negative results must be combined with clinical observations, patient history, and epidemiological information. The expected result is Negative. Fact Sheet for Patients: SugarRoll.be Fact Sheet for Healthcare Providers: https://www.woods-mathews.com/ This test is not yet approved or cleared by the [place] FDA and  has been authorized for detection and/or diagnosis of SARS-CoV-2 by FDA under an Emergency Use Authorization (EUA). This EUA will remain  in effect (meaning this test can be used) for the duration of the COVID-19 declaration  under Section 56 4(b)(1) of the Act, 21 U.S.C. section 360bbb-3(b)(1), unless the authorization is terminated or revoked sooner. Performed at [facility], [place] [address], [place], [place] [address]   Culture, blood (routine x 2)     Status: None (Preliminary result)   Collection Time: 03/30/19  1:53 AM   Specimen: BLOOD RIGHT HAND  Result Value Ref Range Status   Specimen Description   Final    BLOOD RIGHT HAND Performed at [facility] [address], [place], [place] [address]    Special Requests   Final    BOTTLES DRAWN AEROBIC AND ANAEROBIC Blood Culture adequate volume Performed at [facility] [address], [place], [place] [address]    Culture   Final    NO GROWTH 2 DAYS Performed at [facility] [address], [place], [place] [address]    Report Status PENDING  Incomplete  Culture, blood (routine x 2)     Status: None (Preliminary result)   Collection Time: 03/30/19  1:53 AM   Specimen: BLOOD LEFT HAND  Result Value Ref Range Status   Specimen Description   Final    BLOOD LEFT HAND Performed at [facility] [address] [place], [place] [address]    Special Requests   Final    BOTTLES DRAWN AEROBIC ONLY Blood Culture adequate volume Performed at [facility] [address] [place], [place] [address]    Culture   Final    NO GROWTH 2 DAYS Performed at [facility] [address] [place], [place] [address]    Report Status PENDING  Incomplete      Studies: No results found.  Scheduled Meds: . sodium chloride   Intravenous Once  . amLODipine  5 mg Oral Daily  . bictegravir-emtricitabine-tenofovir AF  1 tablet Oral Daily  . citalopram  10 mg Oral Daily  . dexamethasone  4 mg Oral q morning - 10a  . feeding supplement  1 Container Oral BID BM  . fentaNYL  1 patch Transdermal Q72H  . folic acid  1 mg Oral Daily  . multivitamin with minerals  1 tablet  Oral Daily  . pantoprazole (PROTONIX) IV  40 mg Intravenous Q12H  . polyethylene glycol  17 g Oral BID  . senna-docusate  2 tablet Oral BID  . sodium chloride flush  3 mL Intravenous Q12H    Continuous Infusions: . sodium chloride Stopped (03/30/19 2302)  . aztreonam 2 g (04/01/19 1144)  . vancomycin Stopped (04/01/19 0700)     LOS: 3 days     Alma Friendly, MD [facility]  If 7PM-7AM, please contact night-coverage [email or web address] 04/01/2019, 2:49 PM

## 2019-04-02 ENCOUNTER — Encounter (HOSPITAL_COMMUNITY): Payer: Self-pay | Admitting: Internal Medicine

## 2019-04-02 DIAGNOSIS — Z7189 Other specified counseling: Secondary | ICD-10-CM

## 2019-04-02 DIAGNOSIS — R531 Weakness: Secondary | ICD-10-CM

## 2019-04-02 DIAGNOSIS — R040 Epistaxis: Secondary | ICD-10-CM

## 2019-04-02 DIAGNOSIS — Z515 Encounter for palliative care: Secondary | ICD-10-CM

## 2019-04-02 DIAGNOSIS — G4700 Insomnia, unspecified: Secondary | ICD-10-CM

## 2019-04-02 LAB — BPAM RBC
Blood Product Expiration Date: 202101212359
Blood Product Expiration Date: 202101242359
Blood Product Expiration Date: 202101302359
ISSUE DATE / TIME: 202012250042
ISSUE DATE / TIME: 202012251350
ISSUE DATE / TIME: 202012252328
Unit Type and Rh: 5100
Unit Type and Rh: 5100
Unit Type and Rh: 5100

## 2019-04-02 LAB — CBC WITH DIFFERENTIAL/PLATELET
Abs Immature Granulocytes: 0.5 10*3/uL — ABNORMAL HIGH (ref 0.00–0.07)
Band Neutrophils: 8 %
Basophils Absolute: 0 10*3/uL (ref 0.0–0.1)
Basophils Relative: 0 %
Eosinophils Absolute: 0 10*3/uL (ref 0.0–0.5)
Eosinophils Relative: 0 %
HCT: 24.8 % — ABNORMAL LOW (ref 36.0–46.0)
Hemoglobin: 7.8 g/dL — ABNORMAL LOW (ref 12.0–15.0)
Lymphocytes Relative: 21 %
Lymphs Abs: 0.9 10*3/uL (ref 0.7–4.0)
MCH: 26.8 pg (ref 26.0–34.0)
MCHC: 31.5 g/dL (ref 30.0–36.0)
MCV: 85.2 fL (ref 80.0–100.0)
Metamyelocytes Relative: 2 %
Monocytes Absolute: 0.4 10*3/uL (ref 0.1–1.0)
Monocytes Relative: 10 %
Myelocytes: 10 %
Neutro Abs: 2.5 10*3/uL (ref 1.7–7.7)
Neutrophils Relative %: 49 %
Platelets: 26 10*3/uL — CL (ref 150–400)
RBC: 2.91 MIL/uL — ABNORMAL LOW (ref 3.87–5.11)
RDW: 17.1 % — ABNORMAL HIGH (ref 11.5–15.5)
WBC: 4.4 10*3/uL (ref 4.0–10.5)
nRBC: 8.7 % — ABNORMAL HIGH (ref 0.0–0.2)

## 2019-04-02 LAB — TYPE AND SCREEN
ABO/RH(D): O POS
Antibody Screen: NEGATIVE
Unit division: 0
Unit division: 0
Unit division: 0

## 2019-04-02 LAB — BPAM PLATELET PHERESIS
Blood Product Expiration Date: 202012272359
ISSUE DATE / TIME: 202012261233
Unit Type and Rh: 8400

## 2019-04-02 LAB — PATHOLOGIST SMEAR REVIEW

## 2019-04-02 LAB — MRSA PCR SCREENING: MRSA by PCR: NEGATIVE

## 2019-04-02 LAB — BASIC METABOLIC PANEL
Anion gap: 13 (ref 5–15)
BUN: 13 mg/dL (ref 6–20)
CO2: 20 mmol/L — ABNORMAL LOW (ref 22–32)
Calcium: 8.6 mg/dL — ABNORMAL LOW (ref 8.9–10.3)
Chloride: 102 mmol/L (ref 98–111)
Creatinine, Ser: 0.46 mg/dL (ref 0.44–1.00)
GFR calc Af Amer: >60 mL/min (ref >60)
GFR calc non Af Amer: >60 mL/min (ref >60)
Glucose, Bld: 89 mg/dL (ref 70–99)
Potassium: 3.5 mmol/L (ref 3.5–5.1)
Sodium: 135 mmol/L (ref 135–145)

## 2019-04-02 LAB — HIV-1 RNA QUANT-NO REFLEX-BLD
HIV 1 RNA Quant: 124000 copies/mL
LOG10 HIV-1 RNA: 5.093 log10copy/mL

## 2019-04-02 LAB — PREPARE PLATELET PHERESIS: Unit division: 0

## 2019-04-02 LAB — T-HELPER CELLS (CD4) COUNT (NOT AT ARMC)
CD4 % Helper T Cell: 15 % — ABNORMAL LOW (ref 33–65)
CD4 T Cell Abs: 248 /uL — ABNORMAL LOW (ref 400–1790)

## 2019-04-02 MED ORDER — SODIUM CHLORIDE 0.9 % IV SOLN
2.0000 g | Freq: Three times a day (TID) | INTRAVENOUS | Status: AC
  Administered 2019-04-02 – 2019-04-05 (×10): 2 g via INTRAVENOUS
  Filled 2019-04-02 (×10): qty 2

## 2019-04-02 MED ORDER — OXYCODONE HCL 5 MG PO TABS
15.0000 mg | ORAL_TABLET | ORAL | Status: DC | PRN
  Administered 2019-04-03 – 2019-04-11 (×44): 15 mg via ORAL
  Filled 2019-04-02 (×47): qty 3

## 2019-04-02 MED ORDER — ALPRAZOLAM 0.25 MG PO TABS
0.2500 mg | ORAL_TABLET | Freq: Three times a day (TID) | ORAL | Status: DC | PRN
  Administered 2019-04-02 – 2019-04-24 (×26): 0.25 mg via ORAL
  Filled 2019-04-02 (×27): qty 1

## 2019-04-02 MED ORDER — MORPHINE SULFATE (PF) 2 MG/ML IV SOLN
2.0000 mg | INTRAVENOUS | Status: DC | PRN
  Administered 2019-04-02 – 2019-04-13 (×53): 2 mg via INTRAVENOUS
  Filled 2019-04-02 (×55): qty 1

## 2019-04-02 NOTE — Progress Notes (Signed)
Pt is a moderate fall risk. Pt requests for bed alarm to be turned off since she can get up to [facility] without assistance.  RN explained the rationale for safety precautions, pt refused to have bed alarm set.  Bed alarm turned off at pt request/refusal. NS notified. Stacey Drain

## 2019-04-02 NOTE — Progress Notes (Signed)
CRITICAL VALUE ALERT  Critical Value:  Platelets at 26  Date & Time Notied:  04/02/2019 0520   Provider Notified: Silas [place]   Orders Received/Actions taken: No new orders at this time

## 2019-04-02 NOTE — Progress Notes (Signed)
Pharmacy Antibiotic Note  Kristen Roman is a 58 y.o. female admitted on 03/29/2019 with sepsis.  Pharmacy has been consulted for vancomycin + aztreonam dosing.  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 Previously followed by hospice. Broad spectrum antibiotics being started for sepsis, noted PCN allergy.   Today, 04/02/19  D#4/7 aztreonam & vancomycin for sepsis  WBC 4.4  SCr WNL  PCT > 0.1 (12/27)  Tmax 98.9 F  MRSA PCR negative  Plan:  Aztreonam 2 g IV q8h- only covers gram negatives  PCN allergy = itching, consider trial of cefepime for strep coverage  Vancomycin dc'd   LOT 7 days or at DC per ID note   Height: 5' 4.96" (165 cm) Weight: 132 lb 4.4 oz (60 kg) IBW/kg (Calculated) : 56.91  Temp (24hrs), Avg:98.6 F (37 C), Min:98 F (36.7 C), Max:98.9 F (37.2 C)  Recent Labs  Lab 03/29/19 1247 03/30/19 0153 03/30/19 0445 03/30/19 1951 03/30/19 2157 03/31/19 0608 04/01/19 0539 04/02/19 0426  WBC 3.8*  --   --  3.3* 3.4* 3.6* 4.0 4.4  CREATININE 0.48 0.45  --   --   --  0.51 0.43* 0.46  LATICACIDVEN  --   --  0.9  --   --   --   --   --     Estimated Creatinine Clearance: 68.9 mL/min (by C-G formula based on SCr of 0.46 mg/dL).    Allergies  Allergen Reactions  . Amoxicillin Itching  . Penicillin G Itching    Did it involve swelling of the face/tongue/throat, SOB, or low BP? No Did it involve sudden or severe rash/hives, skin peeling, or any reaction on the inside of your mouth or nose? No Did you need to seek medical attention at a hospital or doctor's office? yes When did it last happen?recently If all above answers are "NO", may proceed with cephalosporin use.    Antimicrobials this admission: aztreonam 12/25 >>  vancomycin 12/25 >> 12/28 Dose adjustments this admission:  Microbiology results: 12/25 BCx: ngtd 12/25 SARS-2: Negative 12/25 cryptococcal antigen: neg 12/25: pneumocystis  smear 12/25 HIV RNA: 124,000 up from 64,600 12/24 CD4: 248 up from 150 12/28 MRSA PCR negative  Thank you for allowing pharmacy to be a part of this patient's care.  Eudelia Bunch, Pharm.D [phone] 04/02/2019 1:19 PM

## 2019-04-02 NOTE — Evaluation (Signed)
Physical Therapy Evaluation Patient Details Name: Kristen Roman MRN: [id number] DOB: May 09, 1960 Today's Date: 04/02/2019   History of Present Illness  58 yo female admitted with anemia, weakness, diplopia, neutropenic fever with possible sepsis. Hx of HIV, L2 compression fx, falls, adenocarcinoma of parotid gland with mets, bipolar d/o, polysubstance abuse  Clinical Impression  Limited eval. Pt required Max encouragement for participation with PT on today. Explained to pt that she needs to participate with therapy. She was only agreeable to standing at the bedside. She did not require any assistance for bed mobility or standing. During session, pt stated she would like to d/c to a SNF. She has a preference for a facility in the [facility] area. Encouraged pt to make SW aware of her preferences. Will follow during hospital stay and progress activity as pt will allow.     Follow Up Recommendations SNF(pt is requesting placement)    Equipment Recommendations  None recommended by PT    Recommendations for Other Services       Precautions / Restrictions Precautions Precautions: Fall Restrictions Weight Bearing Restrictions: No      Mobility  Bed Mobility Overal bed mobility: Modified Independent                Transfers Overall transfer level: Needs assistance   Transfers: Sit to/from Stand Sit to Stand: Supervision            Ambulation/Gait             General Gait Details: NT-pt only agreeable to stand at bedside  Stairs            Wheelchair Mobility    Modified Rankin (Stroke Patients Only)       Balance Overall balance assessment: History of Falls;Mild deficits observed, not formally tested                                           Pertinent Vitals/Pain Pain Assessment: Faces Faces Pain Scale: No hurt    Home Living Family/patient expects to be discharged to:: Unsure Living Arrangements: Alone Available Help at  Discharge: Friend(s);Available PRN/intermittently Type of Home: Apartment Home Access: Level entry     Home Layout: One level Home Equipment: Cane - single point;Walker - 4 wheels      Prior Function Level of Independence: Independent with assistive device(s)         Comments: uses RW     Hand Dominance        Extremity/Trunk Assessment   Upper Extremity Assessment Upper Extremity Assessment: Overall WFL for tasks assessed    Lower Extremity Assessment Lower Extremity Assessment: Overall WFL for tasks assessed    Cervical / Trunk Assessment Cervical / Trunk Assessment: Normal  Communication   Communication: No difficulties  Cognition Arousal/Alertness: Awake/alert Behavior During Therapy: WFL for tasks assessed/performed Overall Cognitive Status: Within Functional Limits for tasks assessed                                        General Comments      Exercises     Assessment/Plan    PT Assessment Patient needs continued PT services  PT Problem List Decreased strength;Decreased mobility;Decreased balance;Decreased knowledge of use of DME       PT Treatment Interventions DME instruction;Gait  training;Therapeutic activities;Therapeutic exercise;Patient/family education;Functional mobility training;Balance training    PT Goals (Current goals can be found in the Care Plan section)  Acute Rehab PT Goals Patient Stated Goal: to go to rehab PT Goal Formulation: With patient Time For Goal Achievement: 04/16/19 Potential to Achieve Goals: Fair    Frequency Min 2X/week   Barriers to discharge        Co-evaluation               AM-PAC PT "6 Clicks" Mobility  Outcome Measure Help needed turning from your back to your side while in a flat bed without using bedrails?: None Help needed moving from lying on your back to sitting on the side of a flat bed without using bedrails?: None Help needed moving to and from a bed to a chair  (including a wheelchair)?: A Little Help needed standing up from a chair using your arms (e.g., wheelchair or bedside chair)?: A Little Help needed to walk in hospital room?: A Little Help needed climbing 3-5 steps with a railing? : A Little 6 Click Score: 20    End of Session   Activity Tolerance: Patient tolerated treatment well Patient left: in bed;with call bell/phone within reach;with bed alarm set   PT Visit Diagnosis: Unsteadiness on feet (R26.81);History of falling (Z91.81);Muscle weakness (generalized) (M62.81)    Time: [id number] PT Time Calculation (min) (ACUTE ONLY): 8 min   Charges:   PT Evaluation $PT Eval Low Complexity: 1 Low            Doreatha Massed, PT Acute Rehabilitation Services

## 2019-04-02 NOTE — Progress Notes (Signed)
PROGRESS NOTE  MIKEISHA NIXDORF [id number] DOB: [birth year] DOA: 03/29/2019 PCP: [place]  HPI/Recap of past 24 hours: HPI from Dr Mindi Junker [name] is a 58 y.o. 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 Of note, patient recently discontinued hospice, has poor insight to medical problems and a poor historian. Patient is a reluctant historian with unclear insight to her condition. Pt also reports intermittent blood in her stool over the last week, unable to give further info. In the ED, HD stable except for some mild tachycardia, lab work notable for pancytopenia with a significant drop in hemoglobin to 5.3, FOBT positive, CT head with no significant acute changes.  COVID-19 negative.  Patient admitted for further management.    Today, patient's main complaint is around her inability to stay asleep.  She wants something to help her sleep.  Earlier in the day, she did have an episode of epistaxis which has since resolved.  She was having worsening pain in her neck and head.    Assessment/Plan: Active Problems:   Symptomatic anemia   Anemia   Pressure injury of skin   Pancytopenia (HCC)   Heme positive stool   Generalized weakness   Palliative care by specialist   Goals of care, counseling/discussion  Acute on chronic blood loss anemia likely 2/2 GI bleed Hemoglobin 5.3 on admission, baseline around 8, hemoglobin currently stable FOBT positive S/P 2 units PRBC Continue Protonix GI consulted, no further work-up due to very poor prognosis Daily CBC  Neutropenic fever with possible sepsis likely 2/2 HCAP, rule out PJP Vs bacteremia Vs cryptococcus Currently afebrile, last fever was on 03/30/2019, with leukopenia BC x2 NGTD LA 0.9 Procalcitonin 0.23 UA unremarkable Chest  x-ray showed possible new opacity of the right perihilar region, further evaluation with a CT chest is recommended to exclude underlying mass CTA chest no evidence of PE, possible consolidation in lower lungs right greater than left, widespread bony metastatic disease ID on board, appreciate recs Cryptococcus antigen negative She was receiving vancomycin and aztreonam.  MRSA PCR was negative so vancomycin was discontinued.  Aztreonam was discontinued in favor of cefepime since it does provide some gram-positive coverage.  This should be continued for a total of 7 days of antibiotic therapy.  Chronic pancytopenia/severe thrombocytopenia Likely related to malignancy as well as HIV She received 1 unit of platelets on 03/31/2019 Heme-onc consulted, no further intervention, recommend hospice Daily CBC Monitor for signs of bleeding  HIV CD4 count 248 on 04/02/2019 ?? 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 Pain management Consult palliative care, recommended discharge to SNF, patient continues to refuse hospice.  Per palliative care, she is not ready for comfort measures.  Bipolar disorder Continue home Celexa        Malnutrition Type:  Nutrition Problem: Inadequate oral intake Etiology: chronic illness   Malnutrition Characteristics:  Signs/Symptoms: meal completion < 50%   Nutrition Interventions:  Interventions: Boost Breeze    Estimated body mass index is 22.04 kg/m as calculated  from the following:   Height as of this encounter: 5' 4.96" (1.65 m).   Weight as of this encounter: 60 kg.      Code Status: DNR  Family Communication: None at bedside  Disposition Plan: Patient wishes to be placed in skilled nursing facility in the [facility] area.   Consultants:  GI  ID  Heme-onc  Procedures:  None  Antimicrobials:  Vancomycin 12/25 > 12/28  Aztreonam 12/25 > 12/28  Cefepime 12/28 >  DVT prophylaxis: SCDs due to thrombocytopenia   Objective: Vitals:   04/01/19 1716 04/02/19 0134 04/02/19 0548 04/02/19 1317  BP: (!) 153/89 (!) 173/76 (!) 179/81 (!) 172/94  Pulse: 80 66 66 73  Resp: 20 20 16 16   Temp: 98.9 F (37.2 C) 98.9 F (37.2 C) 98 F (36.7 C) 98.7 F (37.1 C)  TempSrc: Oral Oral Oral Oral  SpO2: 100% 100% 100% 100%  Weight:      Height:        Intake/Output Summary (Last 24 hours) at 04/02/2019 2035 Last data filed at 04/02/2019 1317 Gross per 24 hour  Intake 973.6 ml  Output --  Net 973.6 ml   Filed Weights   03/29/19 2100  Weight: 60 kg    Exam: General exam: Alert, awake, oriented x 3 Respiratory system: Clear to auscultation. Respiratory effort normal. Cardiovascular system:RRR. No murmurs, rubs, gallops. Gastrointestinal system: Abdomen is nondistended, soft and nontender. No organomegaly or masses felt. Normal bowel sounds heard. Central nervous system: Alert and oriented.  Chronic right facial droop. Extremities: No C/C/E, +pedal pulses Skin: No rashes, lesions or ulcers  Psychiatry: Judgement and insight appear normal. Mood & affect appropriate.    Data Reviewed: CBC: Recent Labs  Lab 03/29/19 1247 03/30/19 1951 03/30/19 2157 03/31/19 0608 04/01/19 0539 04/02/19 0426  WBC 3.8* 3.3* 3.4* 3.6* 4.0 4.4  NEUTROABS 1.5*  --   --  1.4* 1.6* 2.5  HGB 5.3* 6.6* 6.0* 8.0* 8.2* 7.8*  HCT 17.6* 21.4* 19.5* 25.6* 26.1* 24.8*  MCV 82.2 86.3 85.5 85.3 84.2 85.2  PLT 23* 15* 14* 13* 32* 26*   Basic Metabolic Panel: Recent Labs  Lab 03/29/19 1247 03/30/19 0153 03/31/19 0608 04/01/19 0539 04/02/19 0426  NA 131*  134* 131* 134* 135  K 3.9 4.0 4.1 3.6 3.5  CL 99 99 97* 95* 102  CO2 22 26 23 23  20*  GLUCOSE 85 88 87 88 89  BUN 13 14 15 15 13   CREATININE 0.48 0.45 0.51 0.43* 0.46  CALCIUM 8.5* 8.8* 8.9 8.3* 8.6*   GFR: Estimated Creatinine Clearance: 68.9 mL/min (by C-G formula based on SCr of 0.46 mg/dL). Liver Function Tests: Recent Labs  Lab 03/31/19 0922  AST 19  ALT 9  ALKPHOS 78  BILITOT 0.2*  PROT 5.5*  ALBUMIN 2.6*   No results for input(s): LIPASE, AMYLASE in the last 168 hours. No results for input(s): AMMONIA in the last 168 hours. Coagulation Profile: Recent Labs  Lab 03/29/19 2256  INR 1.1   Cardiac Enzymes: No results for input(s): CKTOTAL, CKMB, CKMBINDEX, TROPONINI in the last 168 hours. BNP (last 3 results) No results for input(s): PROBNP in the last 8760 hours. HbA1C: No results for input(s): HGBA1C in the last 72 hours. CBG: No results for input(s): GLUCAP in the last 168 hours. Lipid Profile: No results for input(s): CHOL, HDL, LDLCALC, TRIG, CHOLHDL, LDLDIRECT in the last 72 hours. Thyroid Function Tests: No results for input(s): TSH, T4TOTAL, FREET4, T3FREE, THYROIDAB in the last  72 hours. Anemia Panel: No results for input(s): VITAMINB12, FOLATE, FERRITIN, TIBC, IRON, RETICCTPCT in the last 72 hours. Urine analysis:    Component Value Date/Time   COLORURINE YELLOW 03/30/2019 0600   APPEARANCEUR CLEAR 03/30/2019 0600   LABSPEC 1.013 03/30/2019 0600   PHURINE 6.0 03/30/2019 0600   GLUCOSEU NEGATIVE 03/30/2019 0600   GLUCOSEU NEG mg/dL 12/29/2006 2001   HGBUR NEGATIVE 03/30/2019 0600   BILIRUBINUR NEGATIVE 03/30/2019 0600   KETONESUR NEGATIVE 03/30/2019 0600   PROTEINUR NEGATIVE 03/30/2019 0600   UROBILINOGEN 1 12/29/2006 2001   NITRITE NEGATIVE 03/30/2019 0600   LEUKOCYTESUR NEGATIVE 03/30/2019 0600   Sepsis Labs: @LABRCNTIP (procalcitonin:4,lacticidven:4)  ) Recent Results (from the past 240 hour(s))  SARS CORONAVIRUS 2 (TAT 6-24 HRS)  Nasopharyngeal Nasopharyngeal Swab     Status: None   Collection Time: 03/29/19  2:27 PM   Specimen: Nasopharyngeal Swab  Result Value Ref Range Status   SARS Coronavirus 2 NEGATIVE NEGATIVE Final    Comment: (NOTE) SARS-CoV-2 target nucleic acids are NOT DETECTED. The SARS-CoV-2 RNA is generally detectable in upper and lower respiratory specimens during the acute phase of infection. Negative results do not preclude SARS-CoV-2 infection, do not rule out co-infections with other pathogens, and should not be used as the sole basis for treatment or other patient management decisions. Negative results must be combined with clinical observations, patient history, and epidemiological information. The expected result is Negative. Fact Sheet for Patients: SugarRoll.be Fact Sheet for Healthcare Providers: https://www.woods-mathews.com/ This test is not yet approved or cleared by the [place] FDA and  has been authorized for detection and/or diagnosis of SARS-CoV-2 by FDA under an Emergency Use Authorization (EUA). This EUA will remain  in effect (meaning this test can be used) for the duration of the COVID-19 declaration under Section 56 4(b)(1) of the Act, 21 U.S.C. section 360bbb-3(b)(1), unless the authorization is terminated or revoked sooner. Performed at [facility], [place] [address] [place], [place] [address]   Culture, blood (routine x 2)     Status: None (Preliminary result)   Collection Time: 03/30/19  1:53 AM   Specimen: BLOOD RIGHT HAND  Result Value Ref Range Status   Specimen Description   Final    BLOOD RIGHT HAND Performed at [facility] [address], [place], [place] [address]    Special Requests   Final    BOTTLES DRAWN AEROBIC AND ANAEROBIC Blood Culture adequate volume Performed at [facility] [address], [place], [place] [address]    Culture   Final    NO GROWTH 3  DAYS Performed at [facility], [place] [address] [place], [place] [address]    Report Status PENDING  Incomplete  Culture, blood (routine x 2)     Status: None (Preliminary result)   Collection Time: 03/30/19  1:53 AM   Specimen: BLOOD LEFT HAND  Result Value Ref Range Status   Specimen Description   Final    BLOOD LEFT HAND Performed at [facility] [address] [place], [place] [address]    Special Requests   Final    BOTTLES DRAWN AEROBIC ONLY Blood Culture adequate volume Performed at [facility] [address] [place], [place] [address]    Culture   Final    NO GROWTH 3 DAYS Performed at [facility], [place] [address], [place], [place] [address]    Report Status PENDING  Incomplete  MRSA PCR Screening  Status: None   Collection Time: 04/02/19 10:17 AM   Specimen: Nasal Mucosa; Nasopharyngeal  Result Value Ref Range Status   MRSA by PCR NEGATIVE NEGATIVE Final    Comment:        The GeneXpert MRSA Assay (FDA approved for NASAL specimens only), is one component of a comprehensive MRSA colonization surveillance program. It is not intended to diagnose MRSA infection nor to guide or monitor treatment for MRSA infections. Performed at [facility] [place] [address], [place], [place] [address]       Studies: No results found.  Scheduled Meds: . amLODipine  5 mg Oral Daily  . bictegravir-emtricitabine-tenofovir AF  1 tablet Oral Daily  . citalopram  10 mg Oral Daily  . dexamethasone  4 mg Oral q morning - 10a  . feeding supplement  1 Container Oral BID BM  . fentaNYL  1 patch Transdermal Q72H  . folic acid  1 mg Oral Daily  . multivitamin with minerals  1 tablet Oral Daily  . pantoprazole (PROTONIX) IV  40 mg Intravenous Q12H  . polyethylene glycol  17 g Oral BID  . senna-docusate  2 tablet Oral BID  . sodium chloride flush  3 mL Intravenous Q12H    Continuous Infusions: . sodium  chloride Stopped (03/30/19 2302)  . ceFEPime (MAXIPIME) IV       LOS: 4 days     Kathie Dike, MD [facility]  If 7PM-7AM, please contact night-coverage [email or web address] 04/02/2019, 8:35 PM

## 2019-04-02 NOTE — Plan of Care (Signed)
  Problem: Education: Goal: Ability to identify signs and symptoms of gastrointestinal bleeding will improve Outcome: Progressing   Problem: Fluid Volume: Goal: Will show no signs and symptoms of excessive bleeding Outcome: Progressing   

## 2019-04-02 NOTE — Progress Notes (Addendum)
[facility] [place]:   Pt currently enrolled with hospice services with [facility]. She was admitted to our services on 02/26/19 for her malignancy diagnosis.   Spoke to the niece [name] [phone] who is primary contact for pt. She was from home and Louretta Shorten doesn't feel she can care for herself anymore. She lived alone. She states that during conversation with the pt she feels that it would be best if pt goes to a SNF. Discussed that if she uses therapy days or Med A days she would need to get rid of her benefit with hospice. However if she is not a canidate for therapy or they choose to not do therapy she could continue to have the support of hospice care with SNF care at same time.   We will continue to follow while in hospital.  Webb Silversmith RN [phone]
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
## 2019-04-02 NOTE — Progress Notes (Addendum)
INFECTIOUS DISEASE PROGRESS NOTE  ID: Kristen Roman is a 58 y.o. female with  Active Problems:   Symptomatic anemia   Anemia   Pressure injury of skin   Pancytopenia (HCC)   Heme positive stool  Subjective: C/o insomnia and epistaxis.  No further brbpr.   Abtx:  Anti-infectives (From admission, onward)   Start     Dose/Rate Route Frequency Ordered Stop   03/31/19 0600  vancomycin (VANCOREADY) IVPB 1250 mg/250 mL     1,250 mg 166.7 mL/hr over 90 Minutes Intravenous Every 24 hours 03/30/19 0817     03/30/19 1200  aztreonam (AZACTAM) 2 g in sodium chloride 0.9 % 100 mL IVPB     2 g 200 mL/hr over 30 Minutes Intravenous Every 8 hours 03/30/19 1146     03/30/19 1000  bictegravir-emtricitabine-tenofovir AF (BIKTARVY) 50-200-25 MG per tablet 1 tablet     1 tablet Oral Daily 03/29/19 2225     03/30/19 0845  vancomycin (VANCOREADY) IVPB 1250 mg/250 mL     1,250 mg 166.7 mL/hr over 90 Minutes Intravenous  Once 03/30/19 0808 03/30/19 1027   03/30/19 0830  aztreonam (AZACTAM) 2 g in sodium chloride 0.9 % 100 mL IVPB  Status:  Discontinued     2 g 200 mL/hr over 30 Minutes Intravenous Every 8 hours 03/30/19 0810 03/30/19 1146      Medications:  Scheduled: . amLODipine  5 mg Oral Daily  . bictegravir-emtricitabine-tenofovir AF  1 tablet Oral Daily  . citalopram  10 mg Oral Daily  . dexamethasone  4 mg Oral q morning - 10a  . feeding supplement  1 Container Oral BID BM  . fentaNYL  1 patch Transdermal Q72H  . folic acid  1 mg Oral Daily  . multivitamin with minerals  1 tablet Oral Daily  . pantoprazole (PROTONIX) IV  40 mg Intravenous Q12H  . polyethylene glycol  17 g Oral BID  . senna-docusate  2 tablet Oral BID  . sodium chloride flush  3 mL Intravenous Q12H    Objective: Vital signs in last 24 hours: Temp:  [98 F (36.7 C)-98.9 F (37.2 C)] 98 F (36.7 C) (12/28 0548) Pulse Rate:  [66-80] 66 (12/28 0548) Resp:  [16-20] 16 (12/28 0548) BP: (153-179)/(76-89) 179/81  (12/28 0548) SpO2:  [100 %] 100 % (12/28 0548)   General appearance: alert, cooperative and no distress Resp: clear to auscultation bilaterally Cardio: regular rate and rhythm GI: normal findings: bowel sounds normal and soft, non-tender  Lab Results Recent Labs    04/01/19 0539 04/02/19 0426  WBC 4.0 4.4  HGB 8.2* 7.8*  HCT 26.1* 24.8*  NA 134* 135  K 3.6 3.5  CL 95* 102  CO2 23 20*  BUN 15 13  CREATININE 0.43* 0.46   Liver Panel Recent Labs    03/31/19 0922  PROT 5.5*  ALBUMIN 2.6*  AST 19  ALT 9  ALKPHOS 78  BILITOT 0.2*  BILIDIR <0.1  IBILI NOT CALCULATED   Sedimentation Rate No results for input(s): ESRSEDRATE in the last 72 hours. C-Reactive Protein No results for input(s): CRP in the last 72 hours.  Microbiology: Recent Results (from the past 240 hour(s))  SARS CORONAVIRUS 2 (TAT 6-24 HRS) Nasopharyngeal Nasopharyngeal Swab     Status: None   Collection Time: 03/29/19  2:27 PM   Specimen: Nasopharyngeal Swab  Result Value Ref Range Status   SARS Coronavirus 2 NEGATIVE NEGATIVE Final    Comment: (NOTE) SARS-CoV-2 target nucleic acids  are NOT DETECTED. The SARS-CoV-2 RNA is generally detectable in upper and lower respiratory specimens during the acute phase of infection. Negative results do not preclude SARS-CoV-2 infection, do not rule out co-infections with other pathogens, and should not be used as the sole basis for treatment or other patient management decisions. Negative results must be combined with clinical observations, patient history, and epidemiological information. The expected result is Negative. Fact Sheet for Patients: SugarRoll.be Fact Sheet for Healthcare Providers: https://www.woods-mathews.com/ This test is not yet approved or cleared by the [place] FDA and  has been authorized for detection and/or diagnosis of SARS-CoV-2 by FDA under an Emergency Use Authorization (EUA). This EUA  will remain  in effect (meaning this test can be used) for the duration of the COVID-19 declaration under Section 56 4(b)(1) of the Act, 21 U.S.C. section 360bbb-3(b)(1), unless the authorization is terminated or revoked sooner. Performed at [facility], [place] [address], [place], [place] [address]   Culture, blood (routine x 2)     Status: None (Preliminary result)   Collection Time: 03/30/19  1:53 AM   Specimen: BLOOD RIGHT HAND  Result Value Ref Range Status   Specimen Description   Final    BLOOD RIGHT HAND Performed at [facility] [address] [place], [place] [address]    Special Requests   Final    BOTTLES DRAWN AEROBIC AND ANAEROBIC Blood Culture adequate volume Performed at [facility] [address] [place], [place] [address]    Culture   Final    NO GROWTH 2 DAYS Performed at [facility] [address], [place], [place] [address]    Report Status PENDING  Incomplete  Culture, blood (routine x 2)     Status: None (Preliminary result)   Collection Time: 03/30/19  1:53 AM   Specimen: BLOOD LEFT HAND  Result Value Ref Range Status   Specimen Description   Final    BLOOD LEFT HAND Performed at [facility] [address] [place], [place] [address]    Special Requests   Final    BOTTLES DRAWN AEROBIC ONLY Blood Culture adequate volume Performed at [facility] [address] [place], [place] [address]    Culture   Final    NO GROWTH 2 DAYS Performed at [facility] [address] [place],  [address]    Report Status PENDING  Incomplete    Studies/Results: No results found.   Assessment/Plan: GI bleed (Hgb down to 5.3) pancytopenia Stage IV parotid adeno CA (mets to brain) AIDS Bipolar Previous hospice  Total days of antibiotics: 4 vanco/aztreonam biktarvy  WBC improved.  Has been afebrile since admission.  Plan for snf hgb down  slightly Will stop vanco with MRSA pcr (-). Will need to be vigilant for MSSA though as aztreonam will not cover this.  Would consider d/c anbx at day 7, or at d/c which ever is sooner.  Available as needed.          Bobby Rumpf MD, FACP Infectious Diseases (pager) [phone] [email or web address] 04/02/2019, 12:02 PM  LOS: 4 days

## 2019-04-03 DIAGNOSIS — R531 Weakness: Secondary | ICD-10-CM

## 2019-04-03 LAB — CBC WITH DIFFERENTIAL/PLATELET
Abs Immature Granulocytes: 0.6 10*3/uL — ABNORMAL HIGH (ref 0.00–0.07)
Band Neutrophils: 3 %
Basophils Absolute: 0 10*3/uL (ref 0.0–0.1)
Basophils Relative: 0 %
Eosinophils Absolute: 0 10*3/uL (ref 0.0–0.5)
Eosinophils Relative: 1 %
HCT: 25.7 % — ABNORMAL LOW (ref 36.0–46.0)
Hemoglobin: 7.8 g/dL — ABNORMAL LOW (ref 12.0–15.0)
Lymphocytes Relative: 24 %
Lymphs Abs: 1.1 10*3/uL (ref 0.7–4.0)
MCH: 26.4 pg (ref 26.0–34.0)
MCHC: 30.4 g/dL (ref 30.0–36.0)
MCV: 87.1 fL (ref 80.0–100.0)
Metamyelocytes Relative: 2 %
Monocytes Absolute: 0.5 10*3/uL (ref 0.1–1.0)
Monocytes Relative: 11 %
Myelocytes: 11 %
Neutro Abs: 2.2 10*3/uL (ref 1.7–7.7)
Neutrophils Relative %: 48 %
Platelets: 26 10*3/uL — CL (ref 150–400)
RBC: 2.95 MIL/uL — ABNORMAL LOW (ref 3.87–5.11)
RDW: 17.4 % — ABNORMAL HIGH (ref 11.5–15.5)
WBC: 4.4 10*3/uL (ref 4.0–10.5)
nRBC: 11.7 % — ABNORMAL HIGH (ref 0.0–0.2)

## 2019-04-03 MED ORDER — ZOLPIDEM TARTRATE 5 MG PO TABS
5.0000 mg | ORAL_TABLET | Freq: Every evening | ORAL | Status: DC | PRN
  Administered 2019-04-03 – 2019-04-16 (×6): 5 mg via ORAL
  Filled 2019-04-03 (×8): qty 1

## 2019-04-03 MED ORDER — PANTOPRAZOLE SODIUM 40 MG PO TBEC
40.0000 mg | DELAYED_RELEASE_TABLET | Freq: Two times a day (BID) | ORAL | Status: DC
  Administered 2019-04-03 – 2019-04-25 (×43): 40 mg via ORAL
  Filled 2019-04-03 (×45): qty 1

## 2019-04-03 MED ORDER — HYDRALAZINE HCL 50 MG PO TABS
50.0000 mg | ORAL_TABLET | Freq: Three times a day (TID) | ORAL | Status: DC
  Administered 2019-04-03 – 2019-04-25 (×65): 50 mg via ORAL
  Filled 2019-04-03 (×65): qty 1
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
## 2019-04-03 NOTE — Progress Notes (Signed)
PROGRESS NOTE  Kristen Roman [id number] DOB: Nov 08, 1960 DOA: 03/29/2019 PCP: [facility]  HPI/Recap of past 24 hours: HPI from Dr Mindi Junker [name] is a 58 y.o. 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 Of note, patient recently discontinued hospice, has poor insight to medical problems and a poor historian. Patient is a reluctant historian with unclear insight to her condition. Pt also reports intermittent blood in her stool over the last week, unable to give further info. In the ED, HD stable except for some mild tachycardia, lab work notable for pancytopenia with a significant drop in hemoglobin to 5.3, FOBT positive, CT head with no significant acute changes.  COVID-19 negative.  Patient admitted for further management.  Subjective. Complains about difficulty sleeping. No nausea no vomiting. Asking whether I can give her anything by IV for sleep as well as IV for pain.  I explained the rationale of the pain medications.  And she verbalized understanding. Requesting to shower.  Eating okay.  No nausea no vomiting.  Assessment/Plan: Active Problems:   Symptomatic anemia   Anemia   Pressure injury of skin   Pancytopenia (HCC)   Heme positive stool   Generalized weakness   Palliative care by specialist   Goals of care, counseling/discussion  Acute on chronic blood loss anemia likely 2/2 GI bleed Hemoglobin 5.3 on admission, baseline around 8, hemoglobin currently stable FOBT positive S/P 2 units PRBC Continue Protonix GI consulted, no further work-up due to very poor prognosis Daily CBC  Neutropenic fever with possible sepsis likely 2/2 HCAP, rule out PJP Vs bacteremia Vs cryptococcus Currently afebrile, last fever was on 03/30/2019, with leukopenia BC x2 NGTD LA 0.9  Procalcitonin 0.23 UA unremarkable Chest x-ray showed possible new opacity of the right perihilar region, further evaluation with a CT chest is recommended to exclude underlying mass CTA chest no evidence of PE, possible consolidation in lower lungs right greater than left, widespread bony metastatic disease ID on board, appreciate recs Cryptococcus antigen negative She was receiving vancomycin and aztreonam.  MRSA PCR was negative so vancomycin was discontinued.  Aztreonam was discontinued in favor of cefepime since it does provide some gram-positive coverage.  This should be continued for a total of 7 days of antibiotic therapy.  Chronic pancytopenia/severe thrombocytopenia Likely related to malignancy as well as HIV She received 1 unit of platelets on 03/31/2019 Heme-onc consulted, no further intervention, recommend hospice Daily CBC Monitor for signs of bleeding  HIV CD4 count 248 on 04/02/2019 ?? 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 Pain management Consult palliative care, recommended discharge to SNF, patient continues to refuse hospice.  Per palliative care, she is not ready for comfort measures.  Bipolar disorder Continue home Celexa        Malnutrition Type:  Nutrition Problem: Inadequate oral intake Etiology: chronic illness   Malnutrition Characteristics:  Signs/Symptoms: meal completion < 50%   Nutrition Interventions:  Interventions: Boost Breeze    Estimated body mass index is 22.04 kg/m as  calculated from the following:   Height as of this encounter: 5' 4.96" (1.65 m).    Weight as of this encounter: 60 kg.     Code Status: DNR  Family Communication: None at bedside  Disposition Plan: SNF.  Medically stable.  Social worker working on the case.   Consultants:  GI  ID  Heme-onc  Procedures:  None  Antimicrobials:  Vancomycin 12/25 > 12/28  Aztreonam 12/25 > 12/28  Cefepime 12/28 >  DVT prophylaxis: SCDs due to thrombocytopenia   Objective: Vitals:   04/02/19 1317 04/02/19 2112 04/03/19 0541 04/03/19 1315  BP: (!) 172/94 (!) 177/103 (!) 172/92 (!) 148/77  Pulse: 73 69 64 68  Resp: 16 20 18 18   Temp: 98.7 F (37.1 C) 98.9 F (37.2 C) 99 F (37.2 C) 98.9 F (37.2 C)  TempSrc: Oral Oral Oral Oral  SpO2: 100% 100% 100% 98%  Weight:      Height:        Intake/Output Summary (Last 24 hours) at 04/03/2019 1910 Last data filed at 04/03/2019 1317 Gross per 24 hour  Intake 533.25 ml  Output -  Net 533.25 ml   Filed Weights   03/29/19 2100  Weight: 60 kg    Exam: General exam: Alert, awake, oriented x 3 Respiratory system: Clear to auscultation. Respiratory effort normal. Cardiovascular system:RRR. No murmurs, rubs, gallops. Gastrointestinal system: Abdomen is nondistended, soft and nontender. No organomegaly or masses felt. Normal bowel sounds heard. Central nervous system: Alert and oriented.  Chronic right facial droop. Extremities: No C/C/E, +pedal pulses Skin: No rashes, lesions or ulcers  Psychiatry: Judgement and insight appear normal. Mood & affect appropriate.    Data Reviewed: CBC: Recent Labs  Lab 03/29/19 1247 03/30/19 2157 03/31/19 [id number] 04/01/19 0539 04/02/19 0426 04/03/19 0537  WBC 3.8* 3.4* 3.6* 4.0 4.4 4.4  NEUTROABS 1.5*  --  1.4* 1.6* 2.5 2.2  HGB 5.3* 6.0* 8.0* 8.2* 7.8* 7.8*  HCT 17.6* 19.5* 25.6* 26.1* 24.8* 25.7*  MCV 82.2 85.5 85.3 84.2 85.2 87.1  PLT 23* 14* 13* 32* 26* 26*   Basic Metabolic Panel: Recent Labs  Lab 03/29/19 1247 03/30/19 0153 03/31/19 0608 04/01/19 0539  04/02/19 0426  NA 131* 134* 131* 134* 135  K 3.9 4.0 4.1 3.6 3.5  CL 99 99 97* 95* 102  CO2 22 26 23 23  20*  GLUCOSE 85 88 87 88 89  BUN 13 14 15 15 13   CREATININE 0.48 0.45 0.51 0.43* 0.46  CALCIUM 8.5* 8.8* 8.9 8.3* 8.6*   GFR: Estimated Creatinine Clearance: 68.9 mL/min (by C-G formula based on SCr of 0.46 mg/dL). Liver Function Tests: Recent Labs  Lab 03/31/19 0922  AST 19  ALT 9  ALKPHOS 78  BILITOT 0.2*  PROT 5.5*  ALBUMIN 2.6*   No results for input(s): LIPASE, AMYLASE in the last 168 hours. No results for input(s): AMMONIA in the last 168 hours. Coagulation Profile: Recent Labs  Lab 03/29/19 2256  INR 1.1   Cardiac Enzymes: No results for input(s): CKTOTAL, CKMB, CKMBINDEX, TROPONINI in the last 168 hours. BNP (last 3 results) No results for input(s): PROBNP in the last 8760 hours. HbA1C: No results for input(s): HGBA1C in the last 72 hours. CBG: No results for input(s): GLUCAP in the last 168 hours. Lipid Profile: No results for input(s): CHOL, HDL, LDLCALC, TRIG, CHOLHDL, LDLDIRECT in the last 72 hours. Thyroid Function Tests: No results for input(s): TSH, T4TOTAL, FREET4, T3FREE, THYROIDAB in the last 72 hours. Anemia Panel:  No results for input(s): VITAMINB12, FOLATE, FERRITIN, TIBC, IRON, RETICCTPCT in the last 72 hours. Urine analysis:    Component Value Date/Time   COLORURINE YELLOW 03/30/2019 0600   APPEARANCEUR CLEAR 03/30/2019 0600   LABSPEC 1.013 03/30/2019 0600   PHURINE 6.0 03/30/2019 0600   GLUCOSEU NEGATIVE 03/30/2019 0600   GLUCOSEU NEG mg/dL 12/29/2006 2001   HGBUR NEGATIVE 03/30/2019 0600   BILIRUBINUR NEGATIVE 03/30/2019 0600   KETONESUR NEGATIVE 03/30/2019 0600   PROTEINUR NEGATIVE 03/30/2019 0600   UROBILINOGEN 1 12/29/2006 2001   NITRITE NEGATIVE 03/30/2019 0600   LEUKOCYTESUR NEGATIVE 03/30/2019 0600   Sepsis Labs: @LABRCNTIP (procalcitonin:4,lacticidven:4)  ) Recent Results (from the past 240 hour(s))  SARS CORONAVIRUS  2 (TAT 6-24 HRS) Nasopharyngeal Nasopharyngeal Swab     Status: None   Collection Time: 03/29/19  2:27 PM   Specimen: Nasopharyngeal Swab  Result Value Ref Range Status   SARS Coronavirus 2 NEGATIVE NEGATIVE Final    Comment: (NOTE) SARS-CoV-2 target nucleic acids are NOT DETECTED. The SARS-CoV-2 RNA is generally detectable in upper and lower respiratory specimens during the acute phase of infection. Negative results do not preclude SARS-CoV-2 infection, do not rule out co-infections with other pathogens, and should not be used as the sole basis for treatment or other patient management decisions. Negative results must be combined with clinical observations, patient history, and epidemiological information. The expected result is Negative. Fact Sheet for Patients: SugarRoll.be Fact Sheet for Healthcare Providers: https://www.woods-mathews.com/ This test is not yet approved or cleared by the [place] FDA and  has been authorized for detection and/or diagnosis of SARS-CoV-2 by FDA under an Emergency Use Authorization (EUA). This EUA will remain  in effect (meaning this test can be used) for the duration of the COVID-19 declaration under Section 56 4(b)(1) of the Act, 21 U.S.C. section 360bbb-3(b)(1), unless the authorization is terminated or revoked sooner. Performed at [facility], [place] [address] [place], [place] [address]   Culture, blood (routine x 2)     Status: None (Preliminary result)   Collection Time: 03/30/19  1:53 AM   Specimen: BLOOD RIGHT HAND  Result Value Ref Range Status   Specimen Description   Final    BLOOD RIGHT HAND Performed at [facility] [address], [place], [place] [address]    Special Requests   Final    BOTTLES DRAWN AEROBIC AND ANAEROBIC Blood Culture adequate volume Performed at [facility] [address], [place], [place] [address]    Culture   Final     NO GROWTH 4 DAYS Performed at [facility], [place] [address], [place], [place] [address]    Report Status PENDING  Incomplete  Culture, blood (routine x 2)     Status: None (Preliminary result)   Collection Time: 03/30/19  1:53 AM   Specimen: BLOOD LEFT HAND  Result Value Ref Range Status   Specimen Description   Final    BLOOD LEFT HAND Performed at [facility] [address] [place], [place] [address]    Special Requests   Final    BOTTLES DRAWN AEROBIC ONLY Blood Culture adequate volume Performed at [facility] [address], [place], [place] [address]    Culture   Final    NO GROWTH 4 DAYS Performed at [facility], [place] [address], [place], [place] [address]    Report Status PENDING  Incomplete  MRSA PCR Screening     Status:  None   Collection Time: 04/02/19 10:17 AM   Specimen: Nasal Mucosa; Nasopharyngeal  Result Value Ref Range Status   MRSA by PCR NEGATIVE NEGATIVE Final    Comment:        The GeneXpert MRSA Assay (FDA approved for NASAL specimens only), is one component of a comprehensive MRSA colonization surveillance program. It is not intended to diagnose MRSA infection nor to guide or monitor treatment for MRSA infections. Performed at [facility], [place] [address] [place], [place] [address]       Studies: No results found.  Scheduled Meds: . amLODipine  5 mg Oral Daily  . bictegravir-emtricitabine-tenofovir AF  1 tablet Oral Daily  . citalopram  10 mg Oral Daily  . dexamethasone  4 mg Oral q morning - 10a  . feeding supplement  1 Container Oral BID BM  . fentaNYL  1 patch Transdermal Q72H  . folic acid  1 mg Oral Daily  . hydrALAZINE  50 mg Oral Q8H  . multivitamin with minerals  1 tablet Oral Daily  . pantoprazole  40 mg Oral BID  . polyethylene glycol  17 g Oral BID  . senna-docusate  2 tablet Oral BID  . sodium chloride flush  3 mL Intravenous Q12H    Continuous  Infusions: . sodium chloride 250 mL (04/03/19 0603)  . ceFEPime (MAXIPIME) IV 2 g (04/03/19 1347)     LOS: 5 days     Berle Mull, MD [facility]  If 7PM-7AM, please contact night-coverage [email or web address] 04/03/2019, 7:10 PM
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
## 2019-04-04 LAB — CULTURE, BLOOD (ROUTINE X 2)
Culture: NO GROWTH
Culture: NO GROWTH
Special Requests: ADEQUATE
Special Requests: ADEQUATE

## 2019-04-04 LAB — CBC WITH DIFFERENTIAL/PLATELET
Abs Immature Granulocytes: 0.47 10*3/uL — ABNORMAL HIGH (ref 0.00–0.07)
Abs Immature Granulocytes: 0.6 10*3/uL — ABNORMAL HIGH (ref 0.00–0.07)
Basophils Absolute: 0 10*3/uL (ref 0.0–0.1)
Basophils Absolute: 0 10*3/uL (ref 0.0–0.1)
Basophils Relative: 1 %
Basophils Relative: 0 %
Eosinophils Absolute: 0 10*3/uL (ref 0.0–0.5)
Eosinophils Absolute: 0 10*3/uL (ref 0.0–0.5)
Eosinophils Relative: 0 %
Eosinophils Relative: 0 %
HCT: 17.6 % — ABNORMAL LOW (ref 36.0–46.0)
HCT: 23.5 % — ABNORMAL LOW (ref 36.0–46.0)
Hemoglobin: 5.3 g/dL — CL (ref 12.0–15.0)
Hemoglobin: 7.2 g/dL — ABNORMAL LOW (ref 12.0–15.0)
Immature Granulocytes: 12 %
Lymphocytes Relative: 32 %
Lymphocytes Relative: 27 %
Lymphs Abs: 1.2 10*3/uL (ref 0.7–4.0)
Lymphs Abs: 1.2 10*3/uL (ref 0.7–4.0)
MCH: 24.8 pg — ABNORMAL LOW (ref 26.0–34.0)
MCH: 26.2 pg (ref 26.0–34.0)
MCHC: 30.1 g/dL (ref 30.0–36.0)
MCHC: 30.6 g/dL (ref 30.0–36.0)
MCV: 82.2 fL (ref 80.0–100.0)
MCV: 85.5 fL (ref 80.0–100.0)
Metamyelocytes Relative: 2 %
Monocytes Absolute: 0.6 10*3/uL (ref 0.1–1.0)
Monocytes Absolute: 0.6 10*3/uL (ref 0.1–1.0)
Monocytes Relative: 16 %
Monocytes Relative: 12 %
Myelocytes: 6 %
Neutro Abs: 1.5 10*3/uL — ABNORMAL LOW (ref 1.7–7.7)
Neutro Abs: 2.2 10*3/uL (ref 1.7–7.7)
Neutrophils Relative %: 39 %
Neutrophils Relative %: 47 %
Other: 2 %
Platelets: 23 10*3/uL — CL (ref 150–400)
Platelets: 25 10*3/uL — CL (ref 150–400)
Promyelocytes Relative: 4 %
RBC: 2.14 MIL/uL — ABNORMAL LOW (ref 3.87–5.11)
RBC: 2.75 MIL/uL — ABNORMAL LOW (ref 3.87–5.11)
RDW: 21.2 % — ABNORMAL HIGH (ref 11.5–15.5)
RDW: 17.6 % — ABNORMAL HIGH (ref 11.5–15.5)
WBC: 3.8 10*3/uL — ABNORMAL LOW (ref 4.0–10.5)
WBC: 4.6 10*3/uL (ref 4.0–10.5)
nRBC: 4.5 % — ABNORMAL HIGH (ref 0.0–0.2)
nRBC: 11 % — ABNORMAL HIGH (ref 0.0–0.2)

## 2019-04-04 LAB — COMPREHENSIVE METABOLIC PANEL
ALT: 14 U/L (ref 0–44)
AST: 29 U/L (ref 15–41)
Albumin: 2.6 g/dL — ABNORMAL LOW (ref 3.5–5.0)
Alkaline Phosphatase: 79 U/L (ref 38–126)
Anion gap: 9 (ref 5–15)
BUN: 14 mg/dL (ref 6–20)
CO2: 23 mmol/L (ref 22–32)
Calcium: 8.9 mg/dL (ref 8.9–10.3)
Chloride: 101 mmol/L (ref 98–111)
Creatinine, Ser: 0.39 mg/dL — ABNORMAL LOW (ref 0.44–1.00)
GFR calc Af Amer: >60 mL/min (ref >60)
GFR calc non Af Amer: >60 mL/min (ref >60)
Glucose, Bld: 82 mg/dL (ref 70–99)
Potassium: 3.5 mmol/L (ref 3.5–5.1)
Sodium: 133 mmol/L — ABNORMAL LOW (ref 135–145)
Total Bilirubin: 0.5 mg/dL (ref 0.3–1.2)
Total Protein: 5.3 g/dL — ABNORMAL LOW (ref 6.5–8.1)

## 2019-04-04 LAB — MAGNESIUM: Magnesium: 1.8 mg/dL (ref 1.7–2.4)

## 2019-04-04 NOTE — Progress Notes (Signed)
PROGRESS NOTE  Kristen Roman [id number] DOB: 20-Dec-1960 DOA: 03/29/2019 PCP: [facility]  HPI/Recap of past 24 hours: HPI from Dr Mindi Junker [name] is a 58 y.o. 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 Of note, patient recently discontinued hospice, has poor insight to medical problems and a poor historian. Patient is a reluctant historian with unclear insight to her condition. Pt also reports intermittent blood in her stool over the last week, unable to give further info. In the ED, HD stable except for some mild tachycardia, lab work notable for pancytopenia with a significant drop in hemoglobin to 5.3, FOBT positive, CT head with no significant acute changes.  COVID-19 negative.  Patient admitted for further management.  Subjective. Continues to complain about pain, requesting IV pain meds.  Denies any other new complaints.  Assessment/Plan: Active Problems:   Symptomatic anemia   Anemia   Pressure injury of skin   Pancytopenia (HCC)   Heme positive stool   Generalized weakness   Palliative care by specialist   Goals of care, counseling/discussion  Acute on chronic blood loss anemia likely 2/2 GI bleed Hemoglobin 5.3 on admission, baseline around 8, hemoglobin currently stable FOBT positive S/P 2 units PRBC Continue Protonix GI consulted, no further work-up due to very poor prognosis Daily CBC  Neutropenic fever with possible sepsis likely 2/2 HCAP, rule out PJP Vs bacteremia Vs cryptococcus Currently afebrile, last fever was on 03/30/2019, with leukopenia BC x2 NGTD LA 0.9 Procalcitonin 0.23 UA unremarkable Chest x-ray showed possible new opacity of the right perihilar region, further evaluation with a CT chest is recommended to exclude underlying mass CTA chest  no evidence of PE, possible consolidation in lower lungs right greater than left, widespread bony metastatic disease ID on board, appreciate recs Cryptococcus antigen negative She was receiving vancomycin and aztreonam.  MRSA PCR was negative so vancomycin was discontinued.  Aztreonam was discontinued in favor of cefepime since it does provide some gram-positive coverage.  This should be continued for a total of 7 days of antibiotic therapy.  Chronic pancytopenia/severe thrombocytopenia Likely related to malignancy as well as HIV She received 1 unit of platelets on 03/31/2019 Heme-onc consulted, no further intervention, recommend hospice Daily CBC Monitor for signs of bleeding  HIV CD4 count 248 on 04/02/2019 ?? 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 Pain management Consult palliative care, recommended discharge to SNF, patient continues to refuse hospice.  Per palliative care, she is not ready for comfort measures.  Bipolar disorder Continue home Celexa        Malnutrition Type:  Nutrition Problem: Inadequate oral intake Etiology: poor appetite   Malnutrition Characteristics:  Signs/Symptoms: per patient/family report, meal completion < 50%   Nutrition Interventions:  Interventions: Boost Breeze, Magic cup, MVI    Estimated body mass index is 22.04 kg/m as calculated from the following:   Height as of this encounter: 5' 4.96" (1.65 m).   Weight as of this encounter: 60 kg.     Code  Status: DNR  Family Communication: None at bedside  Disposition Plan: SNF.  Medically stable.  Social  worker working on the case.   Consultants:  GI  ID  Heme-onc  Procedures:  None  Antimicrobials:  Vancomycin 12/25 > 12/28  Aztreonam 12/25 > 12/28  Cefepime 12/28 >  DVT prophylaxis: SCDs due to thrombocytopenia   Objective: Vitals:   04/03/19 1315 04/03/19 2059 04/04/19 0354 04/04/19 1212  BP: (!) 148/77 (!) 176/72 (!) 177/77 128/74  Pulse: 68 (!) 59 69 70  Resp: 18 18 18 18   Temp: 98.9 F (37.2 C) 98.2 F (36.8 C) 99.5 F (37.5 C) 99.3 F (37.4 C)  TempSrc: Oral Oral Oral Oral  SpO2: 98% 100% 100% 100%  Weight:      Height:        Intake/Output Summary (Last 24 hours) at 04/04/2019 1932 Last data filed at 04/04/2019 1800 Gross per 24 hour  Intake 640.94 ml  Output --  Net 640.94 ml   Filed Weights   03/29/19 2100  Weight: 60 kg    Exam:  General: NAD, chronic right facial droop  Cardiovascular: S1, S2 present  Respiratory: CTAB  Abdomen: Soft, nontender, nondistended, bowel sounds present  Musculoskeletal: No bilateral pedal edema noted  Skin: Normal  Psychiatry: Normal mood   Data Reviewed: CBC: Recent Labs  Lab 03/31/19 0608 04/01/19 0539 04/02/19 0426 04/03/19 0537 04/04/19 0548  WBC 3.6* 4.0 4.4 4.4 4.6  NEUTROABS 1.4* 1.6* 2.5 2.2 2.2  HGB 8.0* 8.2* 7.8* 7.8* 7.2*  HCT 25.6* 26.1* 24.8* 25.7* 23.5*  MCV 85.3 84.2 85.2 87.1 85.5  PLT 13* 32* 26* 26* 25*   Basic Metabolic Panel: Recent Labs  Lab 03/30/19 0153 03/31/19 0608 04/01/19 0539 04/02/19 0426 04/04/19 0548  NA 134* 131* 134* 135 133*  K 4.0 4.1 3.6 3.5 3.5  CL 99 97* 95* 102 101  CO2 26 23 23  20* 23  GLUCOSE 88 87 88 89 82  BUN 14 15 15 13 14   CREATININE 0.45 0.51 0.43* 0.46 0.39*  CALCIUM 8.8* 8.9 8.3* 8.6* 8.9  MG  --   --   --   --  1.8   GFR: Estimated Creatinine Clearance: 68.9 mL/min (A) (by C-G formula based on SCr of 0.39 mg/dL (L)). Liver Function Tests: Recent Labs  Lab 03/31/19 0922 04/04/19 0548  AST 19 29  ALT 9 14  ALKPHOS  78 79  BILITOT 0.2* 0.5  PROT 5.5* 5.3*  ALBUMIN 2.6* 2.6*   No results for input(s): LIPASE, AMYLASE in the last 168 hours. No results for input(s): AMMONIA in the last 168 hours. Coagulation Profile: Recent Labs  Lab 03/29/19 2256  INR 1.1   Cardiac Enzymes: No results for input(s): CKTOTAL, CKMB, CKMBINDEX, TROPONINI in the last 168 hours. BNP (last 3 results) No results for input(s): PROBNP in the last 8760 hours. HbA1C: No results for input(s): HGBA1C in the last 72 hours. CBG: No results for input(s): GLUCAP in the last 168 hours. Lipid Profile: No results for input(s): CHOL, HDL, LDLCALC, TRIG, CHOLHDL, LDLDIRECT in the last 72 hours. Thyroid Function Tests: No results for input(s): TSH, T4TOTAL, FREET4, T3FREE, THYROIDAB in the last 72 hours. Anemia Panel: No results for input(s): VITAMINB12, FOLATE, FERRITIN, TIBC, IRON, RETICCTPCT in the last 72 hours. Urine analysis:    Component Value Date/Time   COLORURINE YELLOW 03/30/2019 0600   APPEARANCEUR CLEAR 03/30/2019 0600   LABSPEC 1.013 03/30/2019 0600   PHURINE 6.0 03/30/2019 0600  GLUCOSEU NEGATIVE 03/30/2019 0600   GLUCOSEU NEG mg/dL 12/29/2006 2001   HGBUR NEGATIVE 03/30/2019 0600   BILIRUBINUR NEGATIVE 03/30/2019 0600   KETONESUR NEGATIVE 03/30/2019 0600   PROTEINUR NEGATIVE 03/30/2019 0600   UROBILINOGEN 1 12/29/2006 2001   NITRITE NEGATIVE 03/30/2019 0600   LEUKOCYTESUR NEGATIVE 03/30/2019 0600   Sepsis Labs: @LABRCNTIP (procalcitonin:4,lacticidven:4)  ) Recent Results (from the past 240 hour(s))  SARS CORONAVIRUS 2 (TAT 6-24 HRS) Nasopharyngeal Nasopharyngeal Swab     Status: None   Collection Time: 03/29/19  2:27 PM   Specimen: Nasopharyngeal Swab  Result Value Ref Range Status   SARS Coronavirus 2 NEGATIVE NEGATIVE Final    Comment: (NOTE) SARS-CoV-2 target nucleic acids are NOT DETECTED. The SARS-CoV-2 RNA is generally detectable in upper and lower respiratory specimens during the acute phase  of infection. Negative results do not preclude SARS-CoV-2 infection, do not rule out co-infections with other pathogens, and should not be used as the sole basis for treatment or other patient management decisions. Negative results must be combined with clinical observations, patient history, and epidemiological information. The expected result is Negative. Fact Sheet for Patients: SugarRoll.be Fact Sheet for Healthcare Providers: https://www.woods-mathews.com/ This test is not yet approved or cleared by the [place] FDA and  has been authorized for detection and/or diagnosis of SARS-CoV-2 by FDA under an Emergency Use Authorization (EUA). This EUA will remain  in effect (meaning this test can be used) for the duration of the COVID-19 declaration under Section 56 4(b)(1) of the Act, 21 U.S.C. section 360bbb-3(b)(1), unless the authorization is terminated or revoked sooner. Performed at [facility], [place] [address] [place], [place] [address]   Culture, blood (routine x 2)     Status: None   Collection Time: 03/30/19  1:53 AM   Specimen: BLOOD RIGHT HAND  Result Value Ref Range Status   Specimen Description   Final    BLOOD RIGHT HAND Performed at [facility] [address] [place], [place] [address]    Special Requests   Final    BOTTLES DRAWN AEROBIC AND ANAEROBIC Blood Culture adequate volume Performed at [facility] [address], [place], [place] [address]    Culture   Final    NO GROWTH 5 DAYS Performed at [facility], [place] [address] [place], [place] [address]    Report Status 04/04/2019 FINAL  Final  Culture, blood (routine x 2)     Status: None   Collection Time: 03/30/19  1:53 AM   Specimen: BLOOD LEFT HAND  Result Value Ref Range Status   Specimen Description   Final    BLOOD LEFT HAND Performed at [facility] [address]  [place], [place] [address]    Special Requests   Final    BOTTLES DRAWN AEROBIC ONLY Blood Culture adequate volume Performed at [facility] [address] [place], [place] [address]    Culture   Final    NO GROWTH 5 DAYS Performed at [facility], [place] [address] [place],  [address]    Report Status 04/04/2019 FINAL  Final  MRSA PCR Screening     Status: None   Collection Time: 04/02/19 10:17 AM   Specimen: Nasal Mucosa; Nasopharyngeal  Result Value Ref Range Status   MRSA by PCR NEGATIVE NEGATIVE Final    Comment:        The GeneXpert MRSA Assay (FDA approved for NASAL specimens only), is one  component of a comprehensive MRSA colonization surveillance program. It is not intended to diagnose MRSA infection nor to guide or monitor treatment for MRSA infections. Performed at [facility], [place] [address], [place], [place] [address]       Studies: No results found.  Scheduled Meds: . amLODipine  5 mg Oral Daily  . bictegravir-emtricitabine-tenofovir AF  1 tablet Oral Daily  . citalopram  10 mg Oral Daily  . dexamethasone  4 mg Oral q morning - 10a  . feeding supplement  1 Container Oral BID BM  . fentaNYL  1 patch Transdermal Q72H  . folic acid  1 mg Oral Daily  . hydrALAZINE  50 mg Oral Q8H  . multivitamin with minerals  1 tablet Oral Daily  . pantoprazole  40 mg Oral BID  . polyethylene glycol  17 g Oral BID  . senna-docusate  2 tablet Oral BID  . sodium chloride flush  3 mL Intravenous Q12H    Continuous Infusions: . sodium chloride 250 mL (04/03/19 0603)  . ceFEPime (MAXIPIME) IV 2 g (04/04/19 1437)     LOS: 6 days     Alma Friendly, MD [facility]  If 7PM-7AM, please contact night-coverage [email or web address] 04/04/2019, 7:32 PM

## 2019-04-04 NOTE — Progress Notes (Signed)
Patient pt BP 177/77 with P 69.  Pt had just vomited and she was upset. She just wanted her pain medication.   She refused Hydralazine at this time.  Will attempt to recheck later.

## 2019-04-04 NOTE — Progress Notes (Signed)
PT Cancellation Note  Patient Details Name: Kristen Roman MRN: [id number] DOB: 10-Nov-1960   Cancelled Treatment:    Reason Eval/Treat Not Completed: Other (comment) RN reports pt just given morphine and not likely to participate at this time.  Will check back as schedule permits.   Fay Swider,KATHrine E 04/04/2019, 10:44 AM Arlyce Dice, DPT Acute Rehabilitation Services Office: [phone]

## 2019-04-04 NOTE — Progress Notes (Signed)
Nutrition Follow-up  DOCUMENTATION CODES:   Not applicable  INTERVENTION:  - continue Boost Breeze BID. - will order Magic Cup BID with meals, each supplement provides 290 kcal and 9 grams of protein - continue to encourage PO intakes. - if within [place], could consider trial of appetite stimulant to encourage PO intakes.    NUTRITION DIAGNOSIS:   Inadequate oral intake related to poor appetite as evidenced by per patient/family report, meal completion < 50%. -ongoing  GOAL:   Patient will meet greater than or equal to 90% of their needs -unmet  MONITOR:   PO intake, Supplement acceptance, Labs, Weight trends, Skin  ASSESSMENT:   58 y.o. 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 Of note, patient recently discontinued hospice, has poor insight to medical problems and a poor historian. Patient is a reluctant historian with unclear insight to her condition. Pt also reports intermittent blood in her stool over the last week, unable to give further info.  She has not been weighed since admission (12/24). Patient has been consuming 0-25% of meals 12/26-12/29 until lunch yesterday when she ate 90% of meal. She continues to have a poor/decreased appetite and lack of desire to eat. She has been enjoying Boost Breeze supplements and has accepted it 90% of the time offered. She does not like Ensure. Will trial [employer].   Per notes: - acute on chronic blood loss anemia--d/t GIB - neutropenic fever with possible sepsis thought to be 2/2 HCAP - chronic pancytopenia/severe thrombocytopenia  - HIV--questionable compliance PTA - stage 4 parotid adencocarcinoma with mets to brain/meninges--Oncology states terminal; patient declines hospice and comfort measures but is open to Palliative at SNF at time of d/c   Labs  reviewed; Na: 133 mmol/l, creatinine: 0.39 mg/dl. Medications reviewed; 1 mg folvite/day, daily multivitamin with minerals, 40 mg oral protonix BID, 1 packet miralax BID, 2 tablets senokot BID.     Diet Order:   Diet Order            Diet regular Room service appropriate? Yes; Fluid consistency: Thin  Diet effective now              EDUCATION NEEDS:   No education needs have been identified at this time  Skin:  Skin Assessment: Skin Integrity Issues: Skin Integrity Issues:: Stage II Stage II: coccyx  Last BM:  12/29  Height:   Ht Readings from Last 1 Encounters:  03/29/19 5' 4.96" (1.65 m)    Weight:   Wt Readings from Last 1 Encounters:  03/29/19 60 kg    Ideal Body Weight:  56.8 kg  BMI:  Body mass index is 22.04 kg/m.  Estimated Nutritional Needs:   Kcal:  1500-1700  Protein:  70-80g  Fluid:  1.7L/day     Jarome Matin, MS, RD, LDN, CNSC Inpatient Clinical Dietitian Pager # [phone] After hours/weekend pager # [phone]

## 2019-04-04 NOTE — Plan of Care (Signed)
  Problem: Education: Goal: Ability to identify signs and symptoms of gastrointestinal bleeding will improve Outcome: Progressing   Problem: Bowel/Gastric: Goal: Will show no signs and symptoms of gastrointestinal bleeding Outcome: Progressing   Problem: Fluid Volume: Goal: Will show no signs and symptoms of excessive bleeding Outcome: Progressing   Problem: Clinical Measurements: Goal: Complications related to the disease process, condition or treatment will be avoided or minimized Outcome: Progressing   Problem: Education: Goal: Knowledge of General Education information will improve Description: Including pain rating scale, medication(s)/side effects and non-pharmacologic comfort measures Outcome: Progressing   Problem: Health Behavior/Discharge Planning: Goal: Ability to manage health-related needs will improve Outcome: Progressing   Problem: Clinical Measurements: Goal: Ability to maintain clinical measurements within normal limits will improve Outcome: Progressing Goal: Will remain free from infection Outcome: Progressing Goal: Diagnostic test results will improve Outcome: Progressing Goal: Respiratory complications will improve Outcome: Completed/Met Goal: Cardiovascular complication will be avoided Outcome: Completed/Met   Problem: Activity: Goal: Risk for activity intolerance will decrease Outcome: Progressing   Problem: Nutrition: Goal: Adequate nutrition will be maintained Outcome: Progressing   Problem: Coping: Goal: Level of anxiety will decrease Outcome: Progressing   Problem: Elimination: Goal: Will not experience complications related to bowel motility Outcome: Completed/Met Goal: Will not experience complications related to urinary retention Outcome: Completed/Met   Problem: Pain Managment: Goal: General experience of comfort will improve Outcome: Progressing   Problem: Safety: Goal: Ability to remain free from injury will improve Outcome:  Progressing   Problem: Skin Integrity: Goal: Risk for impaired skin integrity will decrease Outcome: Progressing

## 2019-04-05 LAB — CBC WITH DIFFERENTIAL/PLATELET
Abs Immature Granulocytes: 0.6 10*3/uL — ABNORMAL HIGH (ref 0.00–0.07)
Band Neutrophils: 5 %
Basophils Absolute: 0 10*3/uL (ref 0.0–0.1)
Basophils Relative: 0 %
Eosinophils Absolute: 0 10*3/uL (ref 0.0–0.5)
Eosinophils Relative: 0 %
HCT: 26.5 % — ABNORMAL LOW (ref 36.0–46.0)
Hemoglobin: 7.9 g/dL — ABNORMAL LOW (ref 12.0–15.0)
Lymphocytes Relative: 35 %
Lymphs Abs: 1.3 10*3/uL (ref 0.7–4.0)
MCH: 25.6 pg — ABNORMAL LOW (ref 26.0–34.0)
MCHC: 29.8 g/dL — ABNORMAL LOW (ref 30.0–36.0)
MCV: 85.8 fL (ref 80.0–100.0)
Metamyelocytes Relative: 3 %
Monocytes Absolute: 0.5 10*3/uL (ref 0.1–1.0)
Monocytes Relative: 12 %
Myelocytes: 11 %
Neutro Abs: 1.4 10*3/uL — ABNORMAL LOW (ref 1.7–7.7)
Neutrophils Relative %: 33 %
Platelets: 31 10*3/uL — ABNORMAL LOW (ref 150–400)
Promyelocytes Relative: 1 %
RBC: 3.09 MIL/uL — ABNORMAL LOW (ref 3.87–5.11)
RDW: 18 % — ABNORMAL HIGH (ref 11.5–15.5)
WBC: 3.8 10*3/uL — ABNORMAL LOW (ref 4.0–10.5)
nRBC: 7.4 % — ABNORMAL HIGH (ref 0.0–0.2)

## 2019-04-05 MED ORDER — SIMETHICONE 80 MG PO CHEW
80.0000 mg | CHEWABLE_TABLET | Freq: Four times a day (QID) | ORAL | Status: DC | PRN
  Administered 2019-04-05 – 2019-04-19 (×5): 80 mg via ORAL
  Filled 2019-04-05 (×5): qty 1

## 2019-04-05 NOTE — Progress Notes (Signed)
PROGRESS NOTE  Kristen Roman [id number] DOB: 01-Oct-1960 DOA: 03/29/2019 PCP: [facility]  HPI/Recap of past 24 hours: HPI from Dr Kristen Roman Kristen Roman is a 58 y.o. 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 Of note, patient recently discontinued hospice, has poor insight to medical problems and a poor historian. Patient is a reluctant historian with unclear insight to her condition. Pt also reports intermittent blood in her stool over the last week, unable to give further info. In the ED, HD stable except for some mild tachycardia, lab work notable for pancytopenia with a significant drop in hemoglobin to 5.3, FOBT positive, CT head with no significant acute changes.  COVID-19 negative.  Patient admitted for further management.  Subjective. Patient seen and examined at bedside.  Continues to complain about pain.  Talk to patient about hospice which would greatly help to manage her pain, patient stated " i do not want to hear anything about hospice".  Assessment/Plan: Active Problems:   Symptomatic anemia   Anemia   Pressure injury of skin   Pancytopenia (HCC)   Heme positive stool   Generalized weakness   Palliative care by specialist   Goals of care, counseling/discussion   Acute on chronic blood loss anemia likely 2/2 GI bleed Hemoglobin 5.3 on admission, baseline around 8, hemoglobin currently stable FOBT positive S/P 2 units PRBC Continue Protonix GI consulted, no further work-up due to very poor prognosis Daily CBC  Neutropenic fever with possible sepsis likely 2/2 HCAP, rule out PJP Vs bacteremia Vs cryptococcus Currently afebrile, last fever was on 03/30/2019, with leukopenia BC x2 NGTD LA 0.9 Procalcitonin 0.23 UA unremarkable Chest x-ray showed possible new  opacity of the right perihilar region, further evaluation with a CT chest is recommended to exclude underlying mass CTA chest no evidence of PE, possible consolidation in lower lungs right greater than left, widespread bony metastatic disease ID on board, appreciate recs Cryptococcus antigen negative She was receiving vancomycin and aztreonam.  MRSA PCR was negative so vancomycin was discontinued.  Aztreonam was discontinued in favor of cefepime since it does provide some gram-positive coverage.  This should be continued for a total of 7 days of antibiotic therapy.  Chronic pancytopenia/severe thrombocytopenia Likely related to malignancy as well as HIV She received 1 unit of platelets on 03/31/2019 Heme-onc consulted, no further intervention, recommend hospice Daily CBC Monitor for signs of bleeding  HIV CD4 count 248 on 04/02/2019 ?? 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 Pain management Consult palliative care, recommended discharge to SNF, patient continues to refuse hospice.  Per palliative care, she is not ready for comfort measures.  Bipolar disorder Continue home Celexa        Malnutrition Type:  Nutrition Problem: Inadequate oral intake Etiology: poor appetite   Malnutrition Characteristics:  Signs/Symptoms: per patient/family report, meal completion < 50%   Nutrition Interventions:  Interventions: Boost Breeze, Magic cup, MVI    Estimated body mass index is 22.04 kg/m as calculated from the following:  Height as of this encounter: 5' 4.96" (1.65 m).   Weight as of this encounter:  60 kg.     Code Status: DNR  Family Communication: None at bedside  Disposition Plan: SNF.  Medically stable.  Social worker working on the case.   Consultants:  GI  ID  Heme-onc  Procedures:  None  Antimicrobials:  Vancomycin 12/25 > 12/28  Aztreonam 12/25 > 12/28  Cefepime 12/28 >  DVT prophylaxis: SCDs due to thrombocytopenia   Objective: Vitals:   04/04/19 1212 04/04/19 2146 04/05/19 0512 04/05/19 1325  BP: 128/74 136/83 (!) 154/84 (!) 148/93  Pulse: 70 68 66 62  Resp: 18 18 18 16   Temp: 99.3 F (37.4 C) 98.7 F (37.1 C) 98.8 F (37.1 C) 97.8 F (36.6 C)  TempSrc: Oral Oral Oral Oral  SpO2: 100% 97% 96% 97%  Weight:      Height:        Intake/Output Summary (Last 24 hours) at 04/05/2019 1831 Last data filed at 04/05/2019 1400 Gross per 24 hour  Intake 261.69 ml  Output --  Net 261.69 ml   Filed Weights   03/29/19 2100  Weight: 60 kg    Exam:  General: NAD, chronic R facial droop  Cardiovascular: S1, S2 present  Respiratory: CTAB  Abdomen: Soft, nontender, nondistended, bowel sounds present  Musculoskeletal: No bilateral pedal edema noted  Skin: Normal  Psychiatry: Normal mood   Data Reviewed: CBC: Recent Labs  Lab 04/01/19 0539 04/02/19 0426 04/03/19 0537 04/04/19 0548 04/05/19 0540  WBC 4.0 4.4 4.4 4.6 3.8*  NEUTROABS 1.6* 2.5 2.2 2.2 1.4*  HGB 8.2* 7.8* 7.8* 7.2* 7.9*  HCT 26.1* 24.8* 25.7* 23.5* 26.5*  MCV 84.2 85.2 87.1 85.5 85.8  PLT 32* 26* 26* 25* 31*   Basic Metabolic Panel: Recent Labs  Lab 03/30/19 0153 03/31/19 0608 04/01/19 0539 04/02/19 0426 04/04/19 0548  NA 134* 131* 134* 135 133*  K 4.0 4.1 3.6 3.5 3.5  CL 99 97* 95* 102 101  CO2 26 23 23  20* 23  GLUCOSE 88 87 88 89 82  BUN 14 15 15 13 14   CREATININE 0.45 0.51 0.43* 0.46 0.39*  CALCIUM 8.8* 8.9 8.3* 8.6* 8.9  MG  --   --   --   --  1.8   GFR: Estimated Creatinine Clearance: 68.9 mL/min (A) (by C-G formula based on SCr of 0.39 mg/dL  (L)). Liver Function Tests: Recent Labs  Lab 03/31/19 0922 04/04/19 0548  AST 19 29  ALT 9 14  ALKPHOS 78 79  BILITOT 0.2* 0.5  PROT 5.5* 5.3*  ALBUMIN 2.6* 2.6*   No results for input(s): LIPASE, AMYLASE in the last 168 hours. No results for input(s): AMMONIA in the last 168 hours. Coagulation Profile: Recent Labs  Lab 03/29/19 2256  INR 1.1   Cardiac Enzymes: No results for input(s): CKTOTAL, CKMB, CKMBINDEX, TROPONINI in the last 168 hours. BNP (last 3 results) No results for input(s): PROBNP in the last 8760 hours. HbA1C: No results for input(s): HGBA1C in the last 72 hours. CBG: No results for input(s): GLUCAP in the last 168 hours. Lipid Profile: No results for input(s): CHOL, HDL, LDLCALC, TRIG, CHOLHDL, LDLDIRECT in the last 72 hours. Thyroid Function Tests: No results for input(s): TSH, T4TOTAL, FREET4, T3FREE, THYROIDAB in the last 72 hours. Anemia Panel: No results for input(s): VITAMINB12, FOLATE, FERRITIN, TIBC, IRON, RETICCTPCT in the last 72 hours. Urine analysis:    Component Value Date/Time   COLORURINE YELLOW 03/30/2019  0600   APPEARANCEUR CLEAR 03/30/2019 0600   LABSPEC 1.013 03/30/2019 0600   PHURINE 6.0 03/30/2019 0600   GLUCOSEU NEGATIVE 03/30/2019 0600   GLUCOSEU NEG mg/dL 12/29/2006 2001   HGBUR NEGATIVE 03/30/2019 0600   BILIRUBINUR NEGATIVE 03/30/2019 0600   KETONESUR NEGATIVE 03/30/2019 0600   PROTEINUR NEGATIVE 03/30/2019 0600   UROBILINOGEN 1 12/29/2006 2001   NITRITE NEGATIVE 03/30/2019 0600   LEUKOCYTESUR NEGATIVE 03/30/2019 0600   Sepsis Labs: @LABRCNTIP (procalcitonin:4,lacticidven:4)  ) Recent Results (from the past 240 hour(s))  SARS CORONAVIRUS 2 (TAT 6-24 HRS) Nasopharyngeal Nasopharyngeal Swab     Status: None   Collection Time: 03/29/19  2:27 PM   Specimen: Nasopharyngeal Swab  Result Value Ref Range Status   SARS Coronavirus 2 NEGATIVE NEGATIVE Final    Comment: (NOTE) SARS-CoV-2 target nucleic acids are NOT  DETECTED. The SARS-CoV-2 RNA is generally detectable in upper and lower respiratory specimens during the acute phase of infection. Negative results do not preclude SARS-CoV-2 infection, do not rule out co-infections with other pathogens, and should not be used as the sole basis for treatment or other patient management decisions. Negative results must be combined with clinical observations, patient history, and epidemiological information. The expected result is Negative. Fact Sheet for Patients: SugarRoll.be Fact Sheet for Healthcare Providers: https://www.woods-mathews.com/ This test is not yet approved or cleared by the [place] FDA and  has been authorized for detection and/or diagnosis of SARS-CoV-2 by FDA under an Emergency Use Authorization (EUA). This EUA will remain  in effect (meaning this test can be used) for the duration of the COVID-19 declaration under Section 56 4(b)(1) of the Act, 21 U.S.C. section 360bbb-3(b)(1), unless the authorization is terminated or revoked sooner. Performed at [facility], [place] [address], [place], [place] [address]   Culture, blood (routine x 2)     Status: None   Collection Time: 03/30/19  1:53 AM   Specimen: BLOOD RIGHT HAND  Result Value Ref Range Status   Specimen Description   Final    BLOOD RIGHT HAND Performed at [facility] [address] [place], [place] [address]    Special Requests   Final    BOTTLES DRAWN AEROBIC AND ANAEROBIC Blood Culture adequate volume Performed at [facility] [address], [place], [place] [address]    Culture   Final    NO GROWTH 5 DAYS Performed at [facility], [place] [address], [place], [place] [address]    Report Status 04/04/2019 FINAL  Final  Culture, blood (routine x 2)     Status: None   Collection Time: 03/30/19  1:53 AM   Specimen: BLOOD LEFT HAND  Result Value Ref Range Status   Specimen  Description   Final    BLOOD LEFT HAND Performed at [facility] [address], [place], [place] [address]    Special Requests   Final    BOTTLES DRAWN AEROBIC ONLY Blood Culture adequate volume Performed at [facility] [address], [place], [place] [address]    Culture   Final    NO GROWTH 5 DAYS Performed at [facility], [place] [address] [place],  [address]    Report Status 04/04/2019 FINAL  Final  MRSA PCR Screening     Status: None   Collection Time: 04/02/19 10:17 AM   Specimen: Nasal Mucosa; Nasopharyngeal  Result Value Ref Range Status   MRSA by PCR NEGATIVE NEGATIVE Final  Comment:        The GeneXpert MRSA Assay (FDA approved for NASAL specimens only), is one component of a comprehensive MRSA colonization surveillance program. It is not intended to diagnose MRSA infection nor to guide or monitor treatment for MRSA infections. Performed at [facility], [place] [address], [place], [place] [address]       Studies: No results found.  Scheduled Meds: . amLODipine  5 mg Oral Daily  . bictegravir-emtricitabine-tenofovir AF  1 tablet Oral Daily  . citalopram  10 mg Oral Daily  . dexamethasone  4 mg Oral q morning - 10a  . feeding supplement  1 Container Oral BID BM  . fentaNYL  1 patch Transdermal Q72H  . folic acid  1 mg Oral Daily  . hydrALAZINE  50 mg Oral Q8H  . multivitamin with minerals  1 tablet Oral Daily  . pantoprazole  40 mg Oral BID  . polyethylene glycol  17 g Oral BID  . senna-docusate  2 tablet Oral BID  . sodium chloride flush  3 mL Intravenous Q12H    Continuous Infusions: . sodium chloride 250 mL (04/03/19 0603)  . ceFEPime (MAXIPIME) IV 2 g (04/05/19 1354)     LOS: 7 days     Alma Friendly, MD [facility]  If 7PM-7AM, please contact night-coverage [email or web address] 04/05/2019, 6:31 PM

## 2019-04-05 NOTE — Plan of Care (Signed)
  Problem: Education: Goal: Ability to identify signs and symptoms of gastrointestinal bleeding will improve Outcome: Progressing   Problem: Bowel/Gastric: Goal: Will show no signs and symptoms of gastrointestinal bleeding Outcome: Progressing   Problem: Fluid Volume: Goal: Will show no signs and symptoms of excessive bleeding Outcome: Progressing   Problem: Clinical Measurements: Goal: Complications related to the disease process, condition or treatment will be avoided or minimized Outcome: Progressing   Problem: Education: Goal: Knowledge of General Education information will improve Description: Including pain rating scale, medication(s)/side effects and non-pharmacologic comfort measures Outcome: Progressing   Problem: Health Behavior/Discharge Planning: Goal: Ability to manage health-related needs will improve Outcome: Progressing   Problem: Clinical Measurements: Goal: Ability to maintain clinical measurements within normal limits will improve Outcome: Progressing Goal: Will remain free from infection Outcome: Progressing Goal: Diagnostic test results will improve Outcome: Completed/Met   Problem: Activity: Goal: Risk for activity intolerance will decrease Outcome: Progressing   Problem: Nutrition: Goal: Adequate nutrition will be maintained Outcome: Progressing   Problem: Coping: Goal: Level of anxiety will decrease Outcome: Progressing   Problem: Pain Managment: Goal: General experience of comfort will improve Outcome: Progressing   Problem: Safety: Goal: Ability to remain free from injury will improve Outcome: Progressing   Problem: Skin Integrity: Goal: Risk for impaired skin integrity will decrease Outcome: Progressing

## 2019-04-06 LAB — CBC WITH DIFFERENTIAL/PLATELET
Abs Immature Granulocytes: 0.49 10*3/uL — ABNORMAL HIGH (ref 0.00–0.07)
Basophils Absolute: 0 10*3/uL (ref 0.0–0.1)
Basophils Relative: 1 %
Eosinophils Absolute: 0 10*3/uL (ref 0.0–0.5)
Eosinophils Relative: 0 %
HCT: 26.4 % — ABNORMAL LOW (ref 36.0–46.0)
Hemoglobin: 8.2 g/dL — ABNORMAL LOW (ref 12.0–15.0)
Immature Granulocytes: 10 %
Lymphocytes Relative: 39 %
Lymphs Abs: 1.9 10*3/uL (ref 0.7–4.0)
MCH: 26.5 pg (ref 26.0–34.0)
MCHC: 31.1 g/dL (ref 30.0–36.0)
MCV: 85.2 fL (ref 80.0–100.0)
Monocytes Absolute: 0.9 10*3/uL (ref 0.1–1.0)
Monocytes Relative: 18 %
Neutro Abs: 1.5 10*3/uL — ABNORMAL LOW (ref 1.7–7.7)
Neutrophils Relative %: 32 %
Platelets: 34 10*3/uL — ABNORMAL LOW (ref 150–400)
RBC: 3.1 MIL/uL — ABNORMAL LOW (ref 3.87–5.11)
RDW: 17.8 % — ABNORMAL HIGH (ref 11.5–15.5)
WBC: 4.8 10*3/uL (ref 4.0–10.5)
nRBC: 5.2 % — ABNORMAL HIGH (ref 0.0–0.2)

## 2019-04-06 NOTE — TOC Progression Note (Addendum)
Transition of Care [facility]) - Progression Note    Patient Details  Name: SUHA PINNEY MRN: [id number] Date of Birth: [birth year]  Transition of Care [facility]) CM/SW [place], [place] Phone Number: 04/06/2019, 8:49 AM  Clinical Narrative:    Barriers to discharge-Awaiting PASRR approval. [facility] [place] Must closed today in observance of the Holiday.  No SNF bed offers at this time. TOC staff will follow up with facilities "pending status".   [facility] will review patient case next week when they start accepting new patients again.   [facility]- unable to admit new residents at this time. [place] reason given. [facility] [place] [facility].   [facility]-Do not accept medicaid only for SNF.    Expected Discharge Plan: Skilled Nursing Facility Barriers to Discharge: [place] (PASRR), No SNF bed(No SNF bed offers, PASRR level II, [place] Must Closed in observance of the Holiday.)  Expected Discharge Plan and Services Expected Discharge Plan: [place] In-house Referral: Clinical Social Work   Post Acute Care Choice: [facility] Living arrangements for the past 2 months: Single Family Home                                       Social Determinants of Health (SDOH) Interventions    Readmission Risk Interventions No flowsheet data found.

## 2019-04-06 NOTE — Progress Notes (Signed)
PROGRESS NOTE  Kristen Roman [id number] DOB: May 28, 1960 DOA: 03/29/2019 PCP: [place]  HPI/Recap of past 24 hours: HPI from Dr Kristen Roman Kristen Roman is a 59 y.o. 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 Of note, patient recently discontinued hospice, has poor insight to medical problems and a poor historian. Patient is a reluctant historian with unclear insight to her condition. Pt also reports intermittent blood in her stool over the last week, unable to give further info. In the ED, HD stable except for some mild tachycardia, lab work notable for pancytopenia with a significant drop in hemoglobin to 5.3, FOBT positive, CT head with no significant acute changes.  COVID-19 negative.  Patient admitted for further management.  Subjective. Patient seen and examined at bedside.  Continues to complain about headaches and generalized body aches.  Denies any new complaints  Assessment/Plan: Active Problems:   Symptomatic anemia   Anemia   Pressure injury of skin   Pancytopenia (HCC)   Heme positive stool   Generalized weakness   Palliative care by specialist   Goals of care, counseling/discussion   Acute on chronic blood loss anemia likely 2/2 GI bleed Hemoglobin 5.3 on admission, baseline around 8, hemoglobin currently stable FOBT positive S/P 2 units PRBC Continue Protonix GI consulted, no further work-up due to very poor prognosis Daily CBC  Neutropenic fever with possible sepsis likely 2/2 HCAP, rule out PJP Vs bacteremia Vs cryptococcus Currently afebrile, last fever was on 03/30/2019, with leukopenia BC x2 NGTD LA 0.9 Procalcitonin 0.23 UA unremarkable Chest x-ray showed possible new opacity of the right perihilar region, further evaluation with a CT chest is  recommended to exclude underlying mass CTA chest no evidence of PE, possible consolidation in lower lungs right greater than left, widespread bony metastatic disease ID on board, appreciate recs Cryptococcus antigen negative She was receiving vancomycin and aztreonam.  MRSA PCR was negative so vancomycin was discontinued.  Aztreonam was discontinued in favor of cefepime since it does provide some gram-positive coverage Completed 7 days of antibiotics as mentioned above  Chronic pancytopenia/severe thrombocytopenia Likely related to malignancy as well as HIV She received 1 unit of platelets on 03/31/2019 Heme-onc consulted, no further intervention, recommend hospice Daily CBC Monitor for signs of bleeding  HIV CD4 count 248 on 04/02/2019 ?? 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 Pain management Consult palliative care, recommended discharge to SNF, patient continues to refuse hospice.  Per palliative care, she is not ready for comfort measures.  Bipolar disorder Continue home Celexa        Malnutrition Type:  Nutrition Problem: Inadequate oral intake Etiology: poor appetite   Malnutrition Characteristics:  Signs/Symptoms: per patient/family report, meal completion < 50%   Nutrition Interventions:  Interventions: Boost Breeze, Magic cup, MVI    Estimated body mass index is 22.04 kg/m as calculated from the following:   Height as of this encounter: 5' 4.96" (1.65 m).   Weight as of this encounter: 60 kg.  Code Status: DNR  Family Communication: None at bedside  Disposition Plan:  SNF.  Medically stable.  Social worker working on the case, difficult placement due to extensive medical condition and MH condition   Consultants:  GI  ID  Heme-onc  Procedures:  None  Antimicrobials:  Vancomycin 12/25 > 12/28  Aztreonam 12/25 > 12/28  Cefepime 12/28 >  DVT prophylaxis: SCDs due to thrombocytopenia   Objective: Vitals:   04/06/19 0625 04/06/19 0900 04/06/19 1226 04/06/19 1301  BP: (!) 135/91 132/79  104/71  Pulse: 66   83  Resp: 18   16  Temp: 98.2 F (36.8 C)   98.4 F (36.9 C)  TempSrc: Oral   Oral  SpO2: 99%  96% 99%  Weight:      Height:        Intake/Output Summary (Last 24 hours) at 04/06/2019 1713 Last data filed at 04/06/2019 0900 Gross per 24 hour  Intake 1168.27 ml  Output --  Net 1168.27 ml   Filed Weights   03/29/19 2100  Weight: 60 kg    Exam:  General: NAD, chronic right facial droop  Cardiovascular: S1, S2 present  Respiratory: CTAB  Abdomen: Soft, nontender, nondistended, bowel sounds present  Musculoskeletal: No bilateral pedal edema noted  Skin: Normal  Psychiatry: Normal mood   Data Reviewed: CBC: Recent Labs  Lab 04/02/19 0426 04/03/19 0537 04/04/19 0548 04/05/19 0540 04/06/19 0711  WBC 4.4 4.4 4.6 3.8* 4.8  NEUTROABS 2.5 2.2 2.2 1.4* 1.5*  HGB 7.8* 7.8* 7.2* 7.9* 8.2*  HCT 24.8* 25.7* 23.5* 26.5* 26.4*  MCV 85.2 87.1 85.5 85.8 85.2  PLT 26* 26* 25* 31* 34*   Basic Metabolic Panel: Recent Labs  Lab 03/31/19 0608 04/01/19 0539 04/02/19 0426 04/04/19 0548  NA 131* 134* 135 133*  K 4.1 3.6 3.5 3.5  CL 97* 95* 102 101  CO2 23 23 20* 23  GLUCOSE 87 88 89 82  BUN 15 15 13 14   CREATININE 0.51 0.43* 0.46 0.39*  CALCIUM 8.9 8.3* 8.6* 8.9  MG  --   --   --  1.8   GFR: Estimated Creatinine Clearance: 68.9 mL/min (A) (by C-G formula based on SCr of 0.39 mg/dL (L)). Liver Function Tests: Recent Labs  Lab 03/31/19 0922 04/04/19 0548  AST 19 29  ALT 9 14  ALKPHOS 78 79  BILITOT 0.2* 0.5   PROT 5.5* 5.3*  ALBUMIN 2.6* 2.6*   No results for input(s): LIPASE, AMYLASE in the last 168 hours. No results for input(s): AMMONIA in the last 168 hours. Coagulation Profile: No results for input(s): INR, PROTIME in the last 168 hours. Cardiac Enzymes: No results for input(s): CKTOTAL, CKMB, CKMBINDEX, TROPONINI in the last 168 hours. BNP (last 3 results) No results for input(s): PROBNP in the last 8760 hours. HbA1C: No results for input(s): HGBA1C in the last 72 hours. CBG: No results for input(s): GLUCAP in the last 168 hours. Lipid Profile: No results for input(s): CHOL, HDL, LDLCALC, TRIG, CHOLHDL, LDLDIRECT in the last 72 hours. Thyroid Function Tests: No results for input(s): TSH, T4TOTAL, FREET4, T3FREE, THYROIDAB in the last 72 hours. Anemia Panel: No results for input(s): VITAMINB12, FOLATE, FERRITIN, TIBC, IRON, RETICCTPCT in the last 72 hours. Urine analysis:    Component Value Date/Time   COLORURINE YELLOW 03/30/2019 0600   APPEARANCEUR CLEAR 03/30/2019 0600   LABSPEC 1.013 03/30/2019 0600   PHURINE 6.0 03/30/2019 0600   GLUCOSEU NEGATIVE 03/30/2019 0600   GLUCOSEU NEG mg/dL 12/29/2006 2001  HGBUR NEGATIVE 03/30/2019 0600   BILIRUBINUR NEGATIVE 03/30/2019 0600   KETONESUR NEGATIVE 03/30/2019 0600   PROTEINUR NEGATIVE 03/30/2019 0600   UROBILINOGEN 1 12/29/2006 2001   NITRITE NEGATIVE 03/30/2019 0600   LEUKOCYTESUR NEGATIVE 03/30/2019 0600   Sepsis Labs: @LABRCNTIP (procalcitonin:4,lacticidven:4)  ) Recent Results (from the past 240 hour(s))  SARS CORONAVIRUS 2 (TAT 6-24 HRS) Nasopharyngeal Nasopharyngeal Swab     Status: None   Collection Time: 03/29/19  2:27 PM   Specimen: Nasopharyngeal Swab  Result Value Ref Range Status   SARS Coronavirus 2 NEGATIVE NEGATIVE Final    Comment: (NOTE) SARS-CoV-2 target nucleic acids are NOT DETECTED. The SARS-CoV-2 RNA is generally detectable in upper and lower respiratory specimens during the acute phase of  infection. Negative results do not preclude SARS-CoV-2 infection, do not rule out co-infections with other pathogens, and should not be used as the sole basis for treatment or other patient management decisions. Negative results must be combined with clinical observations, patient history, and epidemiological information. The expected result is Negative. Fact Sheet for Patients: SugarRoll.be Fact Sheet for Healthcare Providers: https://www.woods-mathews.com/ This test is not yet approved or cleared by the [place] FDA and  has been authorized for detection and/or diagnosis of SARS-CoV-2 by FDA under an Emergency Use Authorization (EUA). This EUA will remain  in effect (meaning this test can be used) for the duration of the COVID-19 declaration under Section 56 4(b)(1) of the Act, 21 U.S.C. section 360bbb-3(b)(1), unless the authorization is terminated or revoked sooner. Performed at [facility], [place] [address], [place], [place] [address]   Culture, blood (routine x 2)     Status: None   Collection Time: 03/30/19  1:53 AM   Specimen: BLOOD RIGHT HAND  Result Value Ref Range Status   Specimen Description   Final    BLOOD RIGHT HAND Performed at [facility] [address] [place], [place] [address]    Special Requests   Final    BOTTLES DRAWN AEROBIC AND ANAEROBIC Blood Culture adequate volume Performed at [facility] [address] [place], [place] [address]    Culture   Final    NO GROWTH 5 DAYS Performed at [facility], [place] [address], [place], [place] [address]    Report Status 04/04/2019 FINAL  Final  Culture, blood (routine x 2)     Status: None   Collection Time: 03/30/19  1:53 AM   Specimen: BLOOD LEFT HAND  Result Value Ref Range Status   Specimen Description   Final    BLOOD LEFT HAND Performed at [facility] [address], [place],  [place] [address]    Special Requests   Final    BOTTLES DRAWN AEROBIC ONLY Blood Culture adequate volume Performed at [facility] [address] [place], [place] [address]    Culture   Final    NO GROWTH 5 DAYS Performed at [facility], [place] [address], [place], [place] [address]    Report Status 04/04/2019 FINAL  Final  MRSA PCR Screening     Status: None   Collection Time: 04/02/19 10:17 AM   Specimen: Nasal Mucosa; Nasopharyngeal  Result Value Ref Range Status   MRSA by PCR NEGATIVE NEGATIVE Final    Comment:        The GeneXpert MRSA Assay (FDA approved for NASAL specimens only), is one component of a comprehensive MRSA colonization surveillance program. It is not intended to  diagnose MRSA infection nor to guide or monitor treatment for MRSA infections. Performed at [facility], [place] [address] [place],  [address]       Studies: No results found.  Scheduled Meds: . amLODipine  5 mg Oral Daily  . bictegravir-emtricitabine-tenofovir AF  1 tablet Oral Daily  . citalopram  10 mg Oral Daily  . dexamethasone  4 mg Oral q morning - 10a  . feeding supplement  1 Container Oral BID BM  . fentaNYL  1 patch Transdermal Q72H  . folic acid  1 mg Oral Daily  . hydrALAZINE  50 mg Oral Q8H  . multivitamin with minerals  1 tablet Oral Daily  . pantoprazole  40 mg Oral BID  . polyethylene glycol  17 g Oral BID  . senna-docusate  2 tablet Oral BID  . sodium chloride flush  3 mL Intravenous Q12H    Continuous Infusions: . sodium chloride 250 mL (04/03/19 0603)     LOS: 8 days     Alma Friendly, MD [facility]  If 7PM-7AM, please contact night-coverage [email or web address] 04/06/2019, 5:13 PM

## 2019-04-06 NOTE — Progress Notes (Signed)
Physical Therapy Treatment Patient Details Name: Kristen Roman MRN: [id number] DOB: 08/06/1960 Today's Date: 04/06/2019    History of Present Illness 59 yo female admitted with anemia, weakness, diplopia, neutropenic fever with possible sepsis. Hx of HIV, L2 compression fx, falls, adenocarcinoma of parotid gland with mets, bipolar d/o, polysubstance abuse    PT Comments    Pt requesting to use BSC on arrival.  Pt assisted to [facility] with close supervision.  Pt appears mildly unsteady however no overt LOB observed.  Pt returned to sitting EOB and declined any further activity at this time.  Plan per chart is d/c to SNF.    Follow Up Recommendations  SNF(per pt request)     Equipment Recommendations  None recommended by PT    Recommendations for Other Services       Precautions / Restrictions Precautions Precautions: Fall    Mobility  Bed Mobility Overal bed mobility: Modified Independent                Transfers Overall transfer level: Needs assistance Equipment used: None Transfers: Sit to/from Stand;Stand Pivot Transfers Sit to Stand: Supervision Stand pivot transfers: Supervision          Ambulation/Gait             General Gait Details: NT-pt only agreeable for up to [facility]   Stairs             Wheelchair Mobility    Modified Rankin (Stroke Patients Only)       Balance Overall balance assessment: History of Falls;Mild deficits observed, not formally tested                                          Cognition Arousal/Alertness: Awake/alert Behavior During Therapy: WFL for tasks assessed/performed Overall Cognitive Status: Within Functional Limits for tasks assessed                                        Exercises      General Comments        Pertinent Vitals/Pain Pain Assessment: Faces Faces Pain Scale: No hurt Pain Intervention(s): (requesting her 1430 pain meds, RN notified)    Home Living                       Prior Function            PT Goals (current goals can now be found in the care plan section) Progress towards PT goals: Progressing toward goals    Frequency    Min 2X/week      PT Plan Current plan remains appropriate    Co-evaluation              AM-PAC PT "6 Clicks" Mobility   Outcome Measure  Help needed turning from your back to your side while in a flat bed without using bedrails?: None Help needed moving from lying on your back to sitting on the side of a flat bed without using bedrails?: None Help needed moving to and from a bed to a chair (including a wheelchair)?: A Little Help needed standing up from a chair using your arms (e.g., wheelchair or bedside chair)?: A Little Help needed to walk in hospital room?: A Little Help needed climbing 3-5 steps with  a railing? : A Little 6 Click Score: 20    End of Session   Activity Tolerance: Patient tolerated treatment well Patient left: in bed;with call bell/phone within reach;with bed alarm set Nurse Communication: Mobility status;Patient requests pain meds PT Visit Diagnosis: Unsteadiness on feet (R26.81);History of falling (Z91.81);Muscle weakness (generalized) (M62.81)     Time: [id number] PT Time Calculation (min) (ACUTE ONLY): 20 min  Charges:  $Therapeutic Activity: 8-22 mins                     Arlyce Dice, DPT Acute Rehabilitation Services Office: [phone]  Mahiya Kercheval,KATHrine E 04/06/2019, 3:30 PM

## 2019-04-07 LAB — CBC WITH DIFFERENTIAL/PLATELET
Abs Immature Granulocytes: 0.44 10*3/uL — ABNORMAL HIGH (ref 0.00–0.07)
Basophils Absolute: 0 10*3/uL (ref 0.0–0.1)
Basophils Relative: 1 %
Eosinophils Absolute: 0 10*3/uL (ref 0.0–0.5)
Eosinophils Relative: 0 %
HCT: 23.3 % — ABNORMAL LOW (ref 36.0–46.0)
Hemoglobin: 7.1 g/dL — ABNORMAL LOW (ref 12.0–15.0)
Immature Granulocytes: 10 %
Lymphocytes Relative: 36 %
Lymphs Abs: 1.7 10*3/uL (ref 0.7–4.0)
MCH: 26 pg (ref 26.0–34.0)
MCHC: 30.5 g/dL (ref 30.0–36.0)
MCV: 85.3 fL (ref 80.0–100.0)
Monocytes Absolute: 0.9 10*3/uL (ref 0.1–1.0)
Monocytes Relative: 19 %
Neutro Abs: 1.5 10*3/uL — ABNORMAL LOW (ref 1.7–7.7)
Neutrophils Relative %: 34 %
Platelets: 38 10*3/uL — ABNORMAL LOW (ref 150–400)
RBC: 2.73 MIL/uL — ABNORMAL LOW (ref 3.87–5.11)
RDW: 17.9 % — ABNORMAL HIGH (ref 11.5–15.5)
WBC: 4.6 10*3/uL (ref 4.0–10.5)
nRBC: 6.1 % — ABNORMAL HIGH (ref 0.0–0.2)

## 2019-04-07 NOTE — Plan of Care (Signed)
  Problem: Education: Goal: Ability to identify signs and symptoms of gastrointestinal bleeding will improve Outcome: Progressing   Problem: Bowel/Gastric: Goal: Will show no signs and symptoms of gastrointestinal bleeding Outcome: Progressing   Problem: Fluid Volume: Goal: Will show no signs and symptoms of excessive bleeding Outcome: Progressing   Problem: Clinical Measurements: Goal: Complications related to the disease process, condition or treatment will be avoided or minimized Outcome: Progressing   Problem: Education: Goal: Knowledge of General Education information will improve Description: Including pain rating scale, medication(s)/side effects and non-pharmacologic comfort measures Outcome: Progressing   Problem: Health Behavior/Discharge Planning: Goal: Ability to manage health-related needs will improve Outcome: Progressing   Problem: Clinical Measurements: Goal: Ability to maintain clinical measurements within normal limits will improve Outcome: Progressing Goal: Will remain free from infection Outcome: Progressing   Problem: Activity: Goal: Risk for activity intolerance will decrease Outcome: Progressing   Problem: Nutrition: Goal: Adequate nutrition will be maintained Outcome: Progressing   Problem: Coping: Goal: Level of anxiety will decrease Outcome: Progressing   Problem: Pain Managment: Goal: General experience of comfort will improve Outcome: Progressing   Problem: Safety: Goal: Ability to remain free from injury will improve Outcome: Progressing   Problem: Skin Integrity: Goal: Risk for impaired skin integrity will decrease Outcome: Progressing

## 2019-04-07 NOTE — Progress Notes (Signed)
Patient c/o worsening pain to right side of head. Per pt, pain is not new, but is worse now than usual. C/o pain being a 20/10. Pt states PRN Oxy IR did not help- requesting all pain medications frequently and earlier than allowed to be given. Pt grimacing, holding the right side of her face. Sitting up on side of bed. MD Horris Latino paged to be made aware. No other neurological symptoms seen (pt has chronic right sided facial drooping). Will continue to monitor closely.

## 2019-04-07 NOTE — Progress Notes (Signed)
PROGRESS NOTE  Kristen Roman [id number] DOB: 25-Feb-1961 DOA: 03/29/2019 PCP: [facility]  HPI/Recap of past 24 hours: HPI from Dr Mindi Junker [name] is a 59 y.o. 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 Of note, patient recently discontinued hospice, has poor insight to medical problems and a poor historian. Patient is a reluctant historian with unclear insight to her condition. Pt also reports intermittent blood in her stool over the last week, unable to give further info. In the ED, HD stable except for some mild tachycardia, lab work notable for pancytopenia with a significant drop in hemoglobin to 5.3, FOBT positive, CT head with no significant acute changes.  COVID-19 negative.  Patient admitted for further management.  Subjective. Patient seen and examined at bedside.  Continues to request for pain meds to be increased for her right side of her face/head.  Denies any other new complaints.  Assessment/Plan: Active Problems:   Symptomatic anemia   Anemia   Pressure injury of skin   Pancytopenia (HCC)   Heme positive stool   Generalized weakness   Palliative care by specialist   Goals of care, counseling/discussion   Acute on chronic blood loss anemia likely 2/2 GI bleed Hemoglobin 5.3 on admission, baseline around 8, hemoglobin fluctuating FOBT positive S/P 2 units PRBC Continue Protonix GI consulted, no further work-up due to very poor prognosis Daily CBC  Neutropenic fever with possible sepsis likely 2/2 HCAP, rule out PJP Vs bacteremia Vs cryptococcus Currently afebrile, last fever was on 03/30/2019, with leukopenia BC x2 NGTD LA 0.9 Procalcitonin 0.23 UA unremarkable Chest x-ray showed possible new opacity of the right perihilar region, further evaluation with  a CT chest is recommended to exclude underlying mass CTA chest no evidence of PE, possible consolidation in lower lungs right greater than left, widespread bony metastatic disease ID on board, appreciate recs Cryptococcus antigen negative She was receiving vancomycin and aztreonam.  MRSA PCR was negative so vancomycin was discontinued.  Aztreonam was discontinued in favor of cefepime since it does provide some gram-positive coverage Completed 7 days of antibiotics as mentioned above  Chronic pancytopenia/severe thrombocytopenia Likely related to malignancy as well as HIV She received 1 unit of platelets on 03/31/2019 Heme-onc consulted, no further intervention, recommend hospice Daily CBC Monitor for signs of bleeding  HIV CD4 count 248 on 04/02/2019 ?? 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 Pain management Consult palliative care, recommended discharge to SNF, patient continues to refuse hospice.  Per palliative care, she is not ready for comfort measures.  Bipolar disorder Continue home Celexa        Malnutrition Type:  Nutrition Problem: Inadequate oral intake Etiology: poor appetite   Malnutrition Characteristics:  Signs/Symptoms: per patient/family report, meal completion < 50%   Nutrition Interventions:  Interventions: Boost Breeze, Magic cup, MVI    Estimated body mass index is 22.04 kg/m as calculated from the following:   Height as of this encounter: 5' 4.96" (1.65 m).   Weight as of this  encounter: 60 kg.     Code Status: DNR  Family Communication: None at  bedside  Disposition Plan: SNF.  Medically stable.  Social worker working on the case, difficult placement due to extensive medical condition and MH condition   Consultants:  GI  ID  Heme-onc  Procedures:  None  Antimicrobials:  Vancomycin 12/25 > 12/28  Aztreonam 12/25 > 12/28  Cefepime 12/28 >  DVT prophylaxis: SCDs due to thrombocytopenia   Objective: Vitals:   04/06/19 1301 04/06/19 2224 04/07/19 0533 04/07/19 1415  BP: 104/71 129/90 137/75 122/75  Pulse: 83 72 72 67  Resp: 16 20 18 16   Temp: 98.4 F (36.9 C) 98.2 F (36.8 C) 99.3 F (37.4 C) 98.2 F (36.8 C)  TempSrc: Oral Oral Oral Oral  SpO2: 99% 98% 98% 98%  Weight:      Height:        Intake/Output Summary (Last 24 hours) at 04/07/2019 1619 Last data filed at 04/06/2019 1800 Gross per 24 hour  Intake 360 ml  Output --  Net 360 ml   Filed Weights   03/29/19 2100  Weight: 60 kg    Exam:  General: NAD, chronic right facial droop  Cardiovascular: S1, S2 present  Respiratory: CTAB  Abdomen: Soft, nontender, nondistended, bowel sounds present  Musculoskeletal: No bilateral pedal edema noted  Skin: Normal  Psychiatry: Normal mood    Data Reviewed: CBC: Recent Labs  Lab 04/03/19 0537 04/04/19 0548 04/05/19 0540 04/06/19 0711 04/07/19 0458  WBC 4.4 4.6 3.8* 4.8 4.6  NEUTROABS 2.2 2.2 1.4* 1.5* 1.5*  HGB 7.8* 7.2* 7.9* 8.2* 7.1*  HCT 25.7* 23.5* 26.5* 26.4* 23.3*  MCV 87.1 85.5 85.8 85.2 85.3  PLT 26* 25* 31* 34* 38*   Basic Metabolic Panel: Recent Labs  Lab 04/01/19 0539 04/02/19 0426 04/04/19 0548  NA 134* 135 133*  K 3.6 3.5 3.5  CL 95* 102 101  CO2 23 20* 23  GLUCOSE 88 89 82  BUN 15 13 14   CREATININE 0.43* 0.46 0.39*  CALCIUM 8.3* 8.6* 8.9  MG  --   --  1.8   GFR: Estimated Creatinine Clearance: 68.9 mL/min (A) (by C-G formula based on SCr of 0.39 mg/dL (L)). Liver Function Tests: Recent Labs  Lab 04/04/19 0548  AST 29  ALT 14  ALKPHOS 79  BILITOT 0.5   PROT 5.3*  ALBUMIN 2.6*   No results for input(s): LIPASE, AMYLASE in the last 168 hours. No results for input(s): AMMONIA in the last 168 hours. Coagulation Profile: No results for input(s): INR, PROTIME in the last 168 hours. Cardiac Enzymes: No results for input(s): CKTOTAL, CKMB, CKMBINDEX, TROPONINI in the last 168 hours. BNP (last 3 results) No results for input(s): PROBNP in the last 8760 hours. HbA1C: No results for input(s): HGBA1C in the last 72 hours. CBG: No results for input(s): GLUCAP in the last 168 hours. Lipid Profile: No results for input(s): CHOL, HDL, LDLCALC, TRIG, CHOLHDL, LDLDIRECT in the last 72 hours. Thyroid Function Tests: No results for input(s): TSH, T4TOTAL, FREET4, T3FREE, THYROIDAB in the last 72 hours. Anemia Panel: No results for input(s): VITAMINB12, FOLATE, FERRITIN, TIBC, IRON, RETICCTPCT in the last 72 hours. Urine analysis:    Component Value Date/Time   COLORURINE YELLOW 03/30/2019 0600   APPEARANCEUR CLEAR 03/30/2019 0600   LABSPEC 1.013 03/30/2019 0600   PHURINE 6.0 03/30/2019 0600   GLUCOSEU NEGATIVE 03/30/2019 0600   GLUCOSEU NEG mg/dL 12/29/2006 2001   HGBUR NEGATIVE 03/30/2019 0600  BILIRUBINUR NEGATIVE 03/30/2019 0600   KETONESUR NEGATIVE 03/30/2019 0600   PROTEINUR NEGATIVE 03/30/2019 0600   UROBILINOGEN 1 12/29/2006 2001   NITRITE NEGATIVE 03/30/2019 0600   LEUKOCYTESUR NEGATIVE 03/30/2019 0600   Sepsis Labs: @LABRCNTIP (procalcitonin:4,lacticidven:4)  ) Recent Results (from the past 240 hour(s))  SARS CORONAVIRUS 2 (TAT 6-24 HRS) Nasopharyngeal Nasopharyngeal Swab     Status: None   Collection Time: 03/29/19  2:27 PM   Specimen: Nasopharyngeal Swab  Result Value Ref Range Status   SARS Coronavirus 2 NEGATIVE NEGATIVE Final    Comment: (NOTE) SARS-CoV-2 target nucleic acids are NOT DETECTED. The SARS-CoV-2 RNA is generally detectable in upper and lower respiratory specimens during the acute phase of infection.  Negative results do not preclude SARS-CoV-2 infection, do not rule out co-infections with other pathogens, and should not be used as the sole basis for treatment or other patient management decisions. Negative results must be combined with clinical observations, patient history, and epidemiological information. The expected result is Negative. Fact Sheet for Patients: SugarRoll.be Fact Sheet for Healthcare Providers: https://www.woods-mathews.com/ This test is not yet approved or cleared by the [place] FDA and  has been authorized for detection and/or diagnosis of SARS-CoV-2 by FDA under an Emergency Use Authorization (EUA). This EUA will remain  in effect (meaning this test can be used) for the duration of the COVID-19 declaration under Section 56 4(b)(1) of the Act, 21 U.S.C. section 360bbb-3(b)(1), unless the authorization is terminated or revoked sooner. Performed at [facility], [place] [address] [place], [place] [address]   Culture, blood (routine x 2)     Status: None   Collection Time: 03/30/19  1:53 AM   Specimen: BLOOD RIGHT HAND  Result Value Ref Range Status   Specimen Description   Final    BLOOD RIGHT HAND Performed at [facility] [address], [place], [place] [address]    Special Requests   Final    BOTTLES DRAWN AEROBIC AND ANAEROBIC Blood Culture adequate volume Performed at [facility] [address], [place], [place] [address]    Culture   Final    NO GROWTH 5 DAYS Performed at [facility], [place] [address], [place], [place] [address]    Report Status 04/04/2019 FINAL  Final  Culture, blood (routine x 2)     Status: None   Collection Time: 03/30/19  1:53 AM   Specimen: BLOOD LEFT HAND  Result Value Ref Range Status   Specimen Description   Final    BLOOD LEFT HAND Performed at [facility] [address] [place], [place] [address]     Special Requests   Final    BOTTLES DRAWN AEROBIC ONLY Blood Culture adequate volume Performed at [facility] [address], [place], [place] [address]    Culture   Final    NO GROWTH 5 DAYS Performed at [facility], [place] [address] [place], [place] [address]    Report Status 04/04/2019 FINAL  Final  MRSA PCR Screening     Status: None   Collection Time: 04/02/19 10:17 AM   Specimen: Nasal Mucosa; Nasopharyngeal  Result Value Ref Range Status   MRSA by PCR NEGATIVE NEGATIVE Final    Comment:        The GeneXpert MRSA Assay (FDA approved for NASAL specimens only), is one component of a comprehensive MRSA colonization surveillance program. It is not intended to diagnose MRSA infection nor to guide  or monitor treatment for MRSA infections. Performed at [facility], [place] [address] [place], [place] [address]       Studies: No results found.  Scheduled Meds: . amLODipine  5 mg Oral Daily  . bictegravir-emtricitabine-tenofovir AF  1 tablet Oral Daily  . citalopram  10 mg Oral Daily  . dexamethasone  4 mg Oral q morning - 10a  . feeding supplement  1 Container Oral BID BM  . fentaNYL  1 patch Transdermal Q72H  . folic acid  1 mg Oral Daily  . hydrALAZINE  50 mg Oral Q8H  . multivitamin with minerals  1 tablet Oral Daily  . pantoprazole  40 mg Oral BID  . polyethylene glycol  17 g Oral BID  . senna-docusate  2 tablet Oral BID  . sodium chloride flush  3 mL Intravenous Q12H    Continuous Infusions: . sodium chloride 250 mL (04/03/19 0603)     LOS: 9 days     Alma Friendly, MD [facility]  If 7PM-7AM, please contact night-coverage [email or web address] 04/07/2019, 4:19 PM

## 2019-04-08 LAB — CBC WITH DIFFERENTIAL/PLATELET
Abs Immature Granulocytes: 0.62 10*3/uL — ABNORMAL HIGH (ref 0.00–0.07)
Basophils Absolute: 0 10*3/uL (ref 0.0–0.1)
Basophils Relative: 1 %
Eosinophils Absolute: 0 10*3/uL (ref 0.0–0.5)
Eosinophils Relative: 0 %
HCT: 23.9 % — ABNORMAL LOW (ref 36.0–46.0)
Hemoglobin: 7.4 g/dL — ABNORMAL LOW (ref 12.0–15.0)
Immature Granulocytes: 11 %
Lymphocytes Relative: 33 %
Lymphs Abs: 1.9 10*3/uL (ref 0.7–4.0)
MCH: 26.5 pg (ref 26.0–34.0)
MCHC: 31 g/dL (ref 30.0–36.0)
MCV: 85.7 fL (ref 80.0–100.0)
Monocytes Absolute: 1.2 10*3/uL — ABNORMAL HIGH (ref 0.1–1.0)
Monocytes Relative: 21 %
Neutro Abs: 1.9 10*3/uL (ref 1.7–7.7)
Neutrophils Relative %: 34 %
Platelets: 45 10*3/uL — ABNORMAL LOW (ref 150–400)
RBC: 2.79 MIL/uL — ABNORMAL LOW (ref 3.87–5.11)
RDW: 17.5 % — ABNORMAL HIGH (ref 11.5–15.5)
WBC: 5.7 10*3/uL (ref 4.0–10.5)
nRBC: 7.4 % — ABNORMAL HIGH (ref 0.0–0.2)

## 2019-04-08 LAB — TYPE AND SCREEN
ABO/RH(D): O POS
Antibody Screen: NEGATIVE

## 2019-04-08 NOTE — Progress Notes (Signed)
PROGRESS NOTE  Kristen Roman [id number] DOB: June 17, 1960 DOA: 03/29/2019 PCP: [facility]  HPI/Recap of past 24 hours: HPI from Dr Mindi Junker Kristen Roman is a 59 y.o. 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 Of note, patient recently discontinued hospice, has poor insight to medical problems and a poor historian. Patient is a reluctant historian with unclear insight to her condition. Pt also reports intermittent blood in her stool over the last week, unable to give further info. In the ED, HD stable except for some mild tachycardia, lab work notable for pancytopenia with a significant drop in hemoglobin to 5.3, FOBT positive, CT head with no significant acute changes.  COVID-19 negative.  Patient admitted for further management.  Subjective. Patient seen and examined at bedside.  Continues to request for pain meds to be increased for her right side of her face/head but does not want [place] to discontinue the IV morphine. Agrees to continue current pain regiment as it is.  Denies any other new complaints.  Assessment/Plan: Active Problems:   Symptomatic anemia   Anemia   Pressure injury of skin   Pancytopenia (HCC)   Heme positive stool   Generalized weakness   Palliative care by specialist   Goals of care, counseling/discussion   Acute on chronic blood loss anemia likely 2/2 GI bleed Hemoglobin 5.3 on admission, baseline around 8, hemoglobin fluctuating FOBT positive S/P 2 units PRBC Continue Protonix GI consulted, no further work-up due to very poor prognosis Daily CBC  Neutropenic fever with possible sepsis likely 2/2 HCAP, rule out PJP Vs bacteremia Vs cryptococcus Currently afebrile, last fever was on 03/30/2019, with leukopenia BC x2 NGTD LA 0.9 Procalcitonin 0.23 UA  unremarkable Chest x-ray showed possible new opacity of the right perihilar region, further evaluation with a CT chest is recommended to exclude underlying mass CTA chest no evidence of PE, possible consolidation in lower lungs right greater than left, widespread bony metastatic disease ID on board, appreciate recs Cryptococcus antigen negative She was receiving vancomycin and aztreonam.  MRSA PCR was negative so vancomycin was discontinued.  Aztreonam was discontinued in favor of cefepime since it does provide some gram-positive coverage Completed 7 days of antibiotics as mentioned above  Chronic pancytopenia/severe thrombocytopenia Likely related to malignancy as well as HIV She received 1 unit of platelets on 03/31/2019 Heme-onc consulted, no further intervention, recommend hospice Daily CBC Monitor for signs of bleeding  HIV CD4 count 248 on 04/02/2019 ?? 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 Pain management Consult palliative care, recommended discharge to SNF, patient continues to refuse hospice.  Per palliative care, she is not ready for comfort measures.  Bipolar disorder Continue home Celexa        Malnutrition Type:  Nutrition Problem: Inadequate oral intake Etiology: poor appetite   Malnutrition Characteristics:  Signs/Symptoms: per patient/family report, meal completion < 50%   Nutrition Interventions:  Interventions: Boost Breeze, Magic cup, MVI    Estimated body mass index is 22.04 kg/m as calculated from  the following:   Height as of this encounter: 5' 4.96" (1.65 m).    Weight as of this encounter: 60 kg.     Code Status: DNR  Family Communication: None at bedside  Disposition Plan: SNF.  Medically stable.  Social worker working on the case, difficult placement due to extensive medical condition and MH condition   Consultants:  GI  ID  Heme-onc  Procedures:  None  Antimicrobials:  Vancomycin 12/25 > 12/28  Aztreonam 12/25 > 12/28  Cefepime 12/28 >  DVT prophylaxis: SCDs due to thrombocytopenia   Objective: Vitals:   04/07/19 1415 04/07/19 2101 04/08/19 0452 04/08/19 1349  BP: 122/75 125/75 140/82 (!) 147/86  Pulse: 67 66 69 67  Resp: 16 20 20 16   Temp: 98.2 F (36.8 C) 98 F (36.7 C) 98.1 F (36.7 C) 98.3 F (36.8 C)  TempSrc: Oral Oral Oral Oral  SpO2: 98% 97% 98% 100%  Weight:      Height:        Intake/Output Summary (Last 24 hours) at 04/08/2019 1833 Last data filed at 04/08/2019 0600 Gross per 24 hour  Intake 353 ml  Output --  Net 353 ml   Filed Weights   03/29/19 2100  Weight: 60 kg    Exam:  General: NAD, chronic right facial droop  Cardiovascular: S1, S2 present  Respiratory: CTAB  Abdomen: Soft, nontender, nondistended  Musculoskeletal: No bilateral pedal edema noted  Skin: Normal  Psychiatry: Normal mood    Data Reviewed: CBC: Recent Labs  Lab 04/04/19 0548 04/05/19 0540 04/06/19 0711 04/07/19 0458 04/08/19 0529  WBC 4.6 3.8* 4.8 4.6 5.7  NEUTROABS 2.2 1.4* 1.5* 1.5* 1.9  HGB 7.2* 7.9* 8.2* 7.1* 7.4*  HCT 23.5* 26.5* 26.4* 23.3* 23.9*  MCV 85.5 85.8 85.2 85.3 85.7  PLT 25* 31* 34* 38* 45*   Basic Metabolic Panel: Recent Labs  Lab 04/02/19 0426 04/04/19 0548  NA 135 133*  K 3.5 3.5  CL 102 101  CO2 20* 23  GLUCOSE 89 82  BUN 13 14  CREATININE 0.46 0.39*  CALCIUM 8.6* 8.9  MG  --  1.8   GFR: Estimated Creatinine Clearance: 68.9 mL/min (A) (by C-G formula based on SCr of 0.39 mg/dL (L)). Liver Function Tests: Recent Labs  Lab 04/04/19 0548  AST 29  ALT 14   ALKPHOS 79  BILITOT 0.5  PROT 5.3*  ALBUMIN 2.6*   No results for input(s): LIPASE, AMYLASE in the last 168 hours. No results for input(s): AMMONIA in the last 168 hours. Coagulation Profile: No results for input(s): INR, PROTIME in the last 168 hours. Cardiac Enzymes: No results for input(s): CKTOTAL, CKMB, CKMBINDEX, TROPONINI in the last 168 hours. BNP (last 3 results) No results for input(s): PROBNP in the last 8760 hours. HbA1C: No results for input(s): HGBA1C in the last 72 hours. CBG: No results for input(s): GLUCAP in the last 168 hours. Lipid Profile: No results for input(s): CHOL, HDL, LDLCALC, TRIG, CHOLHDL, LDLDIRECT in the last 72 hours. Thyroid Function Tests: No results for input(s): TSH, T4TOTAL, FREET4, T3FREE, THYROIDAB in the last 72 hours. Anemia Panel: No results for input(s): VITAMINB12, FOLATE, FERRITIN, TIBC, IRON, RETICCTPCT in the last 72 hours. Urine analysis:    Component Value Date/Time   COLORURINE YELLOW 03/30/2019 0600   APPEARANCEUR CLEAR 03/30/2019 0600   LABSPEC 1.013 03/30/2019 0600   PHURINE 6.0 03/30/2019 0600   GLUCOSEU NEGATIVE 03/30/2019 0600   GLUCOSEU NEG mg/dL 12/29/2006 2001   HGBUR NEGATIVE  03/30/2019 0600   BILIRUBINUR NEGATIVE 03/30/2019 0600   KETONESUR NEGATIVE 03/30/2019 0600   PROTEINUR NEGATIVE 03/30/2019 0600   UROBILINOGEN 1 12/29/2006 2001   NITRITE NEGATIVE 03/30/2019 0600   LEUKOCYTESUR NEGATIVE 03/30/2019 0600   Sepsis Labs: @LABRCNTIP (procalcitonin:4,lacticidven:4)  ) Recent Results (from the past 240 hour(s))  Culture, blood (routine x 2)     Status: None   Collection Time: 03/30/19  1:53 AM   Specimen: BLOOD RIGHT HAND  Result Value Ref Range Status   Specimen Description   Final    BLOOD RIGHT HAND Performed at [facility], [place] [address], [place], [place] [address]    Special Requests   Final    BOTTLES DRAWN AEROBIC AND ANAEROBIC Blood Culture adequate volume Performed at  [facility] [address], [place], [place] [address]    Culture   Final    NO GROWTH 5 DAYS Performed at [facility], [place] [address] [place], [place] [address]    Report Status 04/04/2019 FINAL  Final  Culture, blood (routine x 2)     Status: None   Collection Time: 03/30/19  1:53 AM   Specimen: BLOOD LEFT HAND  Result Value Ref Range Status   Specimen Description   Final    BLOOD LEFT HAND Performed at [facility] [address] [place], [place] [address]    Special Requests   Final    BOTTLES DRAWN AEROBIC ONLY Blood Culture adequate volume Performed at [facility] [address] [place], [place] [address]    Culture   Final    NO GROWTH 5 DAYS Performed at [facility], [place] [address], [place], [place] [address]    Report Status 04/04/2019 FINAL  Final  MRSA PCR Screening     Status: None   Collection Time: 04/02/19 10:17 AM   Specimen: Nasal Mucosa; Nasopharyngeal  Result Value Ref Range Status   MRSA by PCR NEGATIVE NEGATIVE Final    Comment:        The GeneXpert MRSA Assay (FDA approved for NASAL specimens only), is one component of a comprehensive MRSA colonization surveillance program. It is not intended to diagnose MRSA infection nor to guide or monitor treatment for MRSA infections. Performed at [facility], [place] [address] [place],  [address]       Studies: No results found.  Scheduled Meds: . amLODipine  5 mg Oral Daily  . bictegravir-emtricitabine-tenofovir AF  1 tablet Oral Daily  . citalopram  10 mg Oral Daily  . dexamethasone  4 mg Oral q morning - 10a  . feeding supplement  1 Container Oral BID BM  . fentaNYL  1 patch Transdermal Q72H  . folic acid  1 mg Oral Daily  . hydrALAZINE  50 mg Oral Q8H  . multivitamin with minerals  1 tablet Oral Daily  . pantoprazole  40 mg Oral BID  . polyethylene glycol  17 g Oral BID  . senna-docusate  2  tablet Oral BID  . sodium chloride flush  3 mL Intravenous Q12H    Continuous Infusions: . sodium chloride 250 mL (04/03/19 0603)     LOS: 10 days   Time spent: 27 minutes  Blain Pais, MD [facility]  If 7PM-7AM, please contact night-coverage [email or web address] 04/08/2019, 6:33 PM

## 2019-04-09 LAB — CBC WITH DIFFERENTIAL/PLATELET
Abs Immature Granulocytes: 0.5 10*3/uL — ABNORMAL HIGH (ref 0.00–0.07)
Band Neutrophils: 12 %
Basophils Absolute: 0 10*3/uL (ref 0.0–0.1)
Basophils Relative: 0 %
Eosinophils Absolute: 0 10*3/uL (ref 0.0–0.5)
Eosinophils Relative: 0 %
HCT: 23.9 % — ABNORMAL LOW (ref 36.0–46.0)
Hemoglobin: 7.3 g/dL — ABNORMAL LOW (ref 12.0–15.0)
Lymphocytes Relative: 30 %
Lymphs Abs: 1.8 10*3/uL (ref 0.7–4.0)
MCH: 26.1 pg (ref 26.0–34.0)
MCHC: 30.5 g/dL (ref 30.0–36.0)
MCV: 85.4 fL (ref 80.0–100.0)
Metamyelocytes Relative: 3 %
Monocytes Absolute: 0.8 10*3/uL (ref 0.1–1.0)
Monocytes Relative: 14 %
Myelocytes: 6 %
Neutro Abs: 2.8 10*3/uL (ref 1.7–7.7)
Neutrophils Relative %: 35 %
Platelets: 58 10*3/uL — ABNORMAL LOW (ref 150–400)
RBC: 2.8 MIL/uL — ABNORMAL LOW (ref 3.87–5.11)
RDW: 17.6 % — ABNORMAL HIGH (ref 11.5–15.5)
WBC: 6 10*3/uL (ref 4.0–10.5)
nRBC: 6.5 % — ABNORMAL HIGH (ref 0.0–0.2)

## 2019-04-09 NOTE — TOC Progression Note (Signed)
Transition of Care [facility]) - Progression Note    Patient Details  Name: Kristen Roman MRN: [id number] Date of Birth: [birth year]  [facility] CM/SW Contact  Joaquin Courts, RN Phone Number: 04/09/2019, 3:20 PM  Clinical Narrative:    Patient remains without bed offers. Cm called [facility] to follow-up and rep states they are still not accepting admissions.   Cm faxed requested information to [facility]. Will need to fax 30-day note once it is signed by physician.  CM notified MD of needed signature and placed from in patient's shadow chart.    Expected Discharge Plan: Skilled Nursing Facility Barriers to Discharge: [place] (PASRR), No SNF bed(No SNF bed offers, PASRR level II, [place] Must Closed in observance of the Holiday.)  Expected Discharge Plan and Services Expected Discharge Plan: [place] In-house Referral: Clinical Social Work   Post Acute Care Choice: [place] Living arrangements for the past 2 months: Single Family Home                                       Social Determinants of Health (SDOH) Interventions    Readmission Risk Interventions No flowsheet data found.

## 2019-04-09 NOTE — Progress Notes (Signed)
Patient arrived to Room 1619 after receiving report from 4th floor RN.  Patient stable from AM assessment.  Patient dressing removed to assess stage 2 on buttock.  Patient complaint of pain and informed medication can be given at 1730. Patient oriented to the room.  Will continue to monitor.

## 2019-04-09 NOTE — Progress Notes (Signed)
PROGRESS NOTE  Kristen Roman [id number] DOB: 09-Jun-1960 DOA: 03/29/2019 PCP: [facility]  HPI/Recap of past 24 hours: HPI from Dr Mindi Junker Kristen Roman is a 59 y.o. 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 Of note, patient recently discontinued hospice, has poor insight to medical problems and a poor historian. Patient is a reluctant historian with unclear insight to her condition. Pt also reports intermittent blood in her stool over the last week, unable to give further info. In the ED, HD stable except for some mild tachycardia, lab work notable for pancytopenia with a significant drop in hemoglobin to 5.3, FOBT positive, CT head with no significant acute changes.  COVID-19 negative.  Patient admitted for further management.  Subjective. Patient seen and examined at bedside.  Continues to request for pain meds to be increased for her right side of her face/head but does not want [place] to discontinue the IV morphine. Agrees to continue current pain regiment as it is. Reports her appetite as good. Has no complaints.   Assessment/Plan: Active Problems:   Symptomatic anemia   Anemia   Pressure injury of skin   Pancytopenia (HCC)   Heme positive stool   Generalized weakness   Palliative care by specialist   Goals of care, counseling/discussion   Acute on chronic blood loss anemia likely 2/2 GI bleed Hemoglobin 5.3 on admission, baseline around 8, hemoglobin fluctuating FOBT positive S/P 2 units PRBC Continue Protonix GI consulted, no further work-up due to very poor prognosis Daily CBC  Neutropenic fever with possible sepsis likely 2/2 HCAP, rule out PJP Vs bacteremia Vs cryptococcus Currently afebrile, last fever was on 03/30/2019, with leukopenia BC x2 NGTD LA 0.9 Procalcitonin  0.23 UA unremarkable Chest x-ray showed possible new opacity of the right perihilar region, further evaluation with a CT chest is recommended to exclude underlying mass CTA chest no evidence of PE, possible consolidation in lower lungs right greater than left, widespread bony metastatic disease ID on board, appreciate recs Cryptococcus antigen negative She was receiving vancomycin and aztreonam.  MRSA PCR was negative so vancomycin was discontinued.  Aztreonam was discontinued in favor of cefepime since it does provide some gram-positive coverage Completed 7 days of antibiotics as mentioned above  Chronic pancytopenia/severe thrombocytopenia Likely related to malignancy as well as HIV She received 1 unit of platelets on 03/31/2019 Heme-onc consulted, no further intervention, recommend hospice Daily CBC Monitor for signs of bleeding  HIV CD4 count 248 on 04/02/2019 ?? 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 Pain management Consult palliative care, recommended discharge to SNF, patient continues to refuse hospice.  Per palliative care, she is not ready for comfort measures.  Bipolar disorder Continue home Celexa        Malnutrition Type:  Nutrition Problem: Inadequate oral intake Etiology: poor appetite   Malnutrition Characteristics:  Signs/Symptoms: per patient/family report, meal completion < 50%   Nutrition Interventions:  Interventions: Boost Breeze, Magic cup, MVI    Estimated body mass index is 22.04 kg/m  as calculated from the following:   Height as of this encounter: 5' 4.96" (1.65  m).   Weight as of this encounter: 60 kg.     Code Status: DNR  Family Communication: None at bedside  Disposition Plan: SNF.  Medically stable.  Social worker working on the case, difficult placement due to extensive medical condition and MH condition   Consultants:  GI  ID  Heme-onc  Procedures:  None  Antimicrobials:  Vancomycin 12/25 > 12/28  Aztreonam 12/25 > 12/28  Cefepime 12/28 >12/31  DVT prophylaxis: SCDs due to thrombocytopenia   Objective: Vitals:   04/08/19 2110 04/09/19 0519 04/09/19 0916 04/09/19 1310  BP: 128/70 137/76 131/81 135/81  Pulse: 66 67 67 68  Resp: 18 18  18   Temp: 98 F (36.7 C) 97.8 F (36.6 C)  98.6 F (37 C)  TempSrc:  Oral  Oral  SpO2: 99% 97%  96%  Weight:      Height:        Intake/Output Summary (Last 24 hours) at 04/09/2019 1652 Last data filed at 04/09/2019 1610 Gross per 24 hour  Intake 680 ml  Output --  Net 680 ml   Filed Weights   03/29/19 2100  Weight: 60 kg    Exam:  General: NAD, chronic right facial droop  Cardiovascular: S1, S2 present  Respiratory: CTAB  Abdomen: Soft, nontender, nondistended  Musculoskeletal: No bilateral pedal edema noted  Skin: Normal  Psychiatry: Normal mood    Data Reviewed: CBC: Recent Labs  Lab 04/05/19 0540 04/06/19 0711 04/07/19 0458 04/08/19 0529 04/09/19 0513  WBC 3.8* 4.8 4.6 5.7 6.0  NEUTROABS 1.4* 1.5* 1.5* 1.9 2.8  HGB 7.9* 8.2* 7.1* 7.4* 7.3*  HCT 26.5* 26.4* 23.3* 23.9* 23.9*  MCV 85.8 85.2 85.3 85.7 85.4  PLT 31* 34* 38* 45* 58*   Basic Metabolic Panel: Recent Labs  Lab 04/04/19 0548  NA 133*  K 3.5  CL 101  CO2 23  GLUCOSE 82  BUN 14  CREATININE 0.39*  CALCIUM 8.9  MG 1.8   GFR: Estimated Creatinine Clearance: 68.9 mL/min (A) (by C-G formula based on SCr of 0.39 mg/dL (L)). Liver Function Tests: Recent Labs  Lab 04/04/19 0548  AST 29  ALT 14  ALKPHOS 79  BILITOT 0.5  PROT 5.3*  ALBUMIN 2.6*   No results for input(s):  LIPASE, AMYLASE in the last 168 hours. No results for input(s): AMMONIA in the last 168 hours. Coagulation Profile: No results for input(s): INR, PROTIME in the last 168 hours. Cardiac Enzymes: No results for input(s): CKTOTAL, CKMB, CKMBINDEX, TROPONINI in the last 168 hours. BNP (last 3 results) No results for input(s): PROBNP in the last 8760 hours. HbA1C: No results for input(s): HGBA1C in the last 72 hours. CBG: No results for input(s): GLUCAP in the last 168 hours. Lipid Profile: No results for input(s): CHOL, HDL, LDLCALC, TRIG, CHOLHDL, LDLDIRECT in the last 72 hours. Thyroid Function Tests: No results for input(s): TSH, T4TOTAL, FREET4, T3FREE, THYROIDAB in the last 72 hours. Anemia Panel: No results for input(s): VITAMINB12, FOLATE, FERRITIN, TIBC, IRON, RETICCTPCT in the last 72 hours. Urine analysis:    Component Value Date/Time   COLORURINE YELLOW 03/30/2019 0600   APPEARANCEUR CLEAR 03/30/2019 0600   LABSPEC 1.013 03/30/2019 0600   PHURINE 6.0 03/30/2019 0600   GLUCOSEU NEGATIVE 03/30/2019 0600   GLUCOSEU NEG mg/dL 12/29/2006 2001   HGBUR NEGATIVE 03/30/2019 0600   BILIRUBINUR NEGATIVE 03/30/2019 0600   KETONESUR NEGATIVE 03/30/2019 0600  PROTEINUR NEGATIVE 03/30/2019 0600   UROBILINOGEN 1 12/29/2006 2001   NITRITE NEGATIVE 03/30/2019 0600   LEUKOCYTESUR NEGATIVE 03/30/2019 0600   Sepsis Labs: @LABRCNTIP (procalcitonin:4,lacticidven:4)  ) Recent Results (from the past 240 hour(s))  MRSA PCR Screening     Status: None   Collection Time: 04/02/19 10:17 AM   Specimen: Nasal Mucosa; Nasopharyngeal  Result Value Ref Range Status   MRSA by PCR NEGATIVE NEGATIVE Final    Comment:        The GeneXpert MRSA Assay (FDA approved for NASAL specimens only), is one component of a comprehensive MRSA colonization surveillance program. It is not intended to diagnose MRSA infection nor to guide or monitor treatment for MRSA infections. Performed at [facility], [place] [address], [place], [place] [address]       Studies: No results found.  Scheduled Meds: . amLODipine  5 mg Oral Daily  . bictegravir-emtricitabine-tenofovir AF  1 tablet Oral Daily  . citalopram  10 mg Oral Daily  . dexamethasone  4 mg Oral q morning - 10a  . feeding supplement  1 Container Oral BID BM  . fentaNYL  1 patch Transdermal Q72H  . folic acid  1 mg Oral Daily  . hydrALAZINE  50 mg Oral Q8H  . multivitamin with minerals  1 tablet Oral Daily  . pantoprazole  40 mg Oral BID  . polyethylene glycol  17 g Oral BID  . senna-docusate  2 tablet Oral BID  . sodium chloride flush  3 mL Intravenous Q12H    Continuous Infusions: . sodium chloride 250 mL (04/03/19 0603)     LOS: 11 days   Time spent: 16 minutes Blain Pais, MD [facility]  If 7PM-7AM, please contact night-coverage [email or web address] 04/09/2019, 4:52 PM

## 2019-04-09 NOTE — Progress Notes (Signed)
Pt transferring to room 1619. Report given to [place], [place]. RN reported no further questions or concerns.

## 2019-04-10 ENCOUNTER — Encounter (HOSPITAL_COMMUNITY): Payer: Self-pay | Admitting: Internal Medicine

## 2019-04-10 LAB — CBC WITH DIFFERENTIAL/PLATELET
Abs Immature Granulocytes: 0.7 10*3/uL — ABNORMAL HIGH (ref 0.00–0.07)
Basophils Absolute: 0.1 10*3/uL (ref 0.0–0.1)
Basophils Relative: 1 %
Eosinophils Absolute: 0 10*3/uL (ref 0.0–0.5)
Eosinophils Relative: 1 %
HCT: 24.1 % — ABNORMAL LOW (ref 36.0–46.0)
Hemoglobin: 7.2 g/dL — ABNORMAL LOW (ref 12.0–15.0)
Immature Granulocytes: 11 %
Lymphocytes Relative: 34 %
Lymphs Abs: 2.3 10*3/uL (ref 0.7–4.0)
MCH: 25.7 pg — ABNORMAL LOW (ref 26.0–34.0)
MCHC: 29.9 g/dL — ABNORMAL LOW (ref 30.0–36.0)
MCV: 86.1 fL (ref 80.0–100.0)
Monocytes Absolute: 1.3 10*3/uL — ABNORMAL HIGH (ref 0.1–1.0)
Monocytes Relative: 19 %
Neutro Abs: 2.2 10*3/uL (ref 1.7–7.7)
Neutrophils Relative %: 34 %
Platelets: 63 10*3/uL — ABNORMAL LOW (ref 150–400)
RBC: 2.8 MIL/uL — ABNORMAL LOW (ref 3.87–5.11)
RDW: 17.3 % — ABNORMAL HIGH (ref 11.5–15.5)
WBC: 6.5 10*3/uL (ref 4.0–10.5)
nRBC: 5.5 % — ABNORMAL HIGH (ref 0.0–0.2)

## 2019-04-10 NOTE — Progress Notes (Addendum)
Progress Note    [name]  [id number] DOB: 1960/08/03  DOA: 03/29/2019 PCP: [place]      Brief Narrative:    Medical records reviewed and are as summarized below:  Kristen Roman is an 59 y.o. 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 Of note, patient recently discontinued hospice, has poor insight to medical problems and a poor historian. She complained of intermittent blood in her stool. In the ED, she was hemodynamically stable except for some mild tachycardia, lab work notable for pancytopenia with a significant drop in hemoglobin to 5.3, FOBT positive, CT head with no significant acute changes.  COVID-19 negative.  Patient admitted for further management.      Assessment/Plan:   Active Problems:   Symptomatic anemia   Anemia   Pressure injury of skin   Pancytopenia (HCC)   Heme positive stool   Generalized weakness   Palliative care by specialist   Goals of care, counseling/discussion   Body mass index is 22.04 kg/m.   Acute on chronic blood loss anemia likely 2/2 GI bleed Hemoglobin 5.3 on admission, baseline around 8, hemoglobin fluctuating FOBT positive S/P 2 units PRBC Continue Protonix GI consulted, no further work-up due to very poor prognosis Daily CBC  Neutropenic fever with possible sepsis likely 2/2 HCAP, She was receiving vancomycin and aztreonam.  MRSA PCR was negative so vancomycin was discontinued.  Aztreonam was discontinued in favor of cefepime since it does provide some gram-positive coverage Completed 7 days of antibiotics as mentioned above  Chronic pancytopenia/severe thrombocytopenia Likely related to malignancy as well as HIV She received 1 unit of platelets on 03/31/2019 Heme-onc consulted, no further intervention,  recommend hospice Daily CBC Monitor for signs of bleeding  HIV CD4 count 248 on 04/02/2019 ?? compliance Continue Biktarvy  Stage IV parotid adenocarcinoma with mets to brain/meninges Chronic right-sided facial droop  No longer interested in hospice as she did not like the way she was treated Continue home Decadron to reduce neurogenic edema Seen by oncology who did not have any further treatments to offer and has told the patient that her cancer is terminal. Pain management PT recommended discharge to SNF, patient continues to refuse hospice.  Per palliative care, she is not ready for comfort measures.  Bipolar disorder Continue psychotropics  Left buttock stage II decubitus ulcer: Unknown if present on admission.  Continue local wound care     Family Communication/Anticipated D/C date and plan/Code Status   DVT prophylaxis: SCDs Code Status: DNR Family Communication: Plan discussed with patient  disposition Plan: Awaiting placement to SNF      Subjective:   Complains of a headache but she says is chronic and she has had this since her stroke.  Objective:    Vitals:   04/09/19 1714 04/09/19 2208 04/10/19 0637 04/10/19 1351  BP: 127/70 134/83 (!) 155/82 132/69  Pulse: 65 69 65 64  Resp: 16 16 20 18   Temp: 98.9 F (37.2 C) 99 F (37.2 C) 99.5 F (37.5 C) 98.8 F (37.1 C)  TempSrc: Oral Oral Oral Oral  SpO2: 99% 98% 96% 100%  Weight:      Height:        Intake/Output Summary (Last 24 hours) at 04/10/2019 1737 Last data filed at 04/09/2019 2214 Gross per 24 hour  Intake 240 ml  Output 600 ml  Net -  360 ml   Filed Weights   03/29/19 2100  Weight: 60 kg    Exam:  GEN: NAD SKIN: Left buttock stage II decubitus ulcer EYES: EOMI ENT: MMM CV: RRR PULM: CTA B ABD: soft, ND, NT, +BS CNS: AAO x 3, right facial palsy EXT: No edema or tenderness   Data Reviewed:   I have personally reviewed following labs and imaging studies:  Labs: Labs  show the following:   Basic Metabolic Panel: Recent Labs  Lab 04/04/19 0548  NA 133*  K 3.5  CL 101  CO2 23  GLUCOSE 82  BUN 14  CREATININE 0.39*  CALCIUM 8.9  MG 1.8   GFR Estimated Creatinine Clearance: 68.9 mL/min (A) (by C-G formula based on SCr of 0.39 mg/dL (L)). Liver Function Tests: Recent Labs  Lab 04/04/19 0548  AST 29  ALT 14  ALKPHOS 79  BILITOT 0.5  PROT 5.3*  ALBUMIN 2.6*   No results for input(s): LIPASE, AMYLASE in the last 168 hours. No results for input(s): AMMONIA in the last 168 hours. Coagulation profile No results for input(s): INR, PROTIME in the last 168 hours.  CBC: Recent Labs  Lab 04/06/19 0711 04/07/19 0458 04/08/19 0529 04/09/19 0513 04/10/19 0425  WBC 4.8 4.6 5.7 6.0 6.5  NEUTROABS 1.5* 1.5* 1.9 2.8 2.2  HGB 8.2* 7.1* 7.4* 7.3* 7.2*  HCT 26.4* 23.3* 23.9* 23.9* 24.1*  MCV 85.2 85.3 85.7 85.4 86.1  PLT 34* 38* 45* 58* 63*   Cardiac Enzymes: No results for input(s): CKTOTAL, CKMB, CKMBINDEX, TROPONINI in the last 168 hours. BNP (last 3 results) No results for input(s): PROBNP in the last 8760 hours. CBG: No results for input(s): GLUCAP in the last 168 hours. D-Dimer: No results for input(s): DDIMER in the last 72 hours. Hgb A1c: No results for input(s): HGBA1C in the last 72 hours. Lipid Profile: No results for input(s): CHOL, HDL, LDLCALC, TRIG, CHOLHDL, LDLDIRECT in the last 72 hours. Thyroid function studies: No results for input(s): TSH, T4TOTAL, T3FREE, THYROIDAB in the last 72 hours.  Invalid input(s): FREET3 Anemia work up: No results for input(s): VITAMINB12, FOLATE, FERRITIN, TIBC, IRON, RETICCTPCT in the last 72 hours. Sepsis Labs: Recent Labs  Lab 04/07/19 0458 04/08/19 0529 04/09/19 0513 04/10/19 0425  WBC 4.6 5.7 6.0 6.5    Microbiology Recent Results (from the past 240 hour(s))  MRSA PCR Screening     Status: None   Collection Time: 04/02/19 10:17 AM   Specimen: Nasal Mucosa; Nasopharyngeal    Result Value Ref Range Status   MRSA by PCR NEGATIVE NEGATIVE Final    Comment:        The GeneXpert MRSA Assay (FDA approved for NASAL specimens only), is one component of a comprehensive MRSA colonization surveillance program. It is not intended to diagnose MRSA infection nor to guide or monitor treatment for MRSA infections. Performed at [facility], [place] [address] [place], [place] [address]     Procedures and diagnostic studies:  No results found.  Medications:   . amLODipine  5 mg Oral Daily  . bictegravir-emtricitabine-tenofovir AF  1 tablet Oral Daily  . citalopram  10 mg Oral Daily  . dexamethasone  4 mg Oral q morning - 10a  . feeding supplement  1 Container Oral BID BM  . fentaNYL  1 patch Transdermal Q72H  . folic acid  1 mg Oral Daily  . hydrALAZINE  50 mg Oral Q8H  . multivitamin with minerals  1 tablet Oral  Daily  . pantoprazole  40 mg Oral BID  . polyethylene glycol  17 g Oral BID  . senna-docusate  2 tablet Oral BID  . sodium chloride flush  3 mL Intravenous Q12H   Continuous Infusions: . sodium chloride 250 mL (04/03/19 0603)     LOS: 12 days   Tyrann Donaho  [facility]   *Please refer to [email or web address], password [email or web address] to get updated schedule on who will round on this patient, as hospitalists switch teams weekly. If 7PM-7AM, please contact night-coverage at [email or web address], password [email or web address] for any overnight needs.  04/10/2019, 5:37 PM

## 2019-04-10 NOTE — TOC Progression Note (Signed)
Transition of Care [facility]) - Progression Note    Patient Details  Name: Kristen Roman MRN: [id number] Date of Birth: 17-Feb-1961  Transition of Care [facility]) CM/SW Contact  Ross Ludwig, [place] Phone Number: 04/10/2019, 4:55 PM  Clinical Narrative:    Patient's Passar number is still pending, CSW uploaded signed 30 day note for patient.  CSW awaiting for bed offers.   Expected Discharge Plan: Skilled Nursing Facility Barriers to Discharge: [place] (PASRR), No SNF bed(No SNF bed offers, PASRR level II, [place] Must Closed in observance of the Holiday.)  Expected Discharge Plan and Services Expected Discharge Plan: [place] In-house Referral: Clinical Social Work   Post Acute Care Choice: [place] Living arrangements for the past 2 months: Single Family Home                                       Social Determinants of Health (SDOH) Interventions    Readmission Risk Interventions No flowsheet data found.

## 2019-04-11 DIAGNOSIS — Z7189 Other specified counseling: Secondary | ICD-10-CM

## 2019-04-11 LAB — CBC WITH DIFFERENTIAL/PLATELET
Abs Immature Granulocytes: 0.52 10*3/uL — ABNORMAL HIGH (ref 0.00–0.07)
Basophils Absolute: 0.1 10*3/uL (ref 0.0–0.1)
Basophils Relative: 1 %
Eosinophils Absolute: 0 10*3/uL (ref 0.0–0.5)
Eosinophils Relative: 0 %
HCT: 24.1 % — ABNORMAL LOW (ref 36.0–46.0)
Hemoglobin: 7.4 g/dL — ABNORMAL LOW (ref 12.0–15.0)
Immature Granulocytes: 12 %
Lymphocytes Relative: 25 %
Lymphs Abs: 1.1 10*3/uL (ref 0.7–4.0)
MCH: 25.8 pg — ABNORMAL LOW (ref 26.0–34.0)
MCHC: 30.7 g/dL (ref 30.0–36.0)
MCV: 84 fL (ref 80.0–100.0)
Monocytes Absolute: 0.9 10*3/uL (ref 0.1–1.0)
Monocytes Relative: 21 %
Neutro Abs: 1.8 10*3/uL (ref 1.7–7.7)
Neutrophils Relative %: 41 %
Platelets: 61 10*3/uL — ABNORMAL LOW (ref 150–400)
RBC: 2.87 MIL/uL — ABNORMAL LOW (ref 3.87–5.11)
RDW: 17.3 % — ABNORMAL HIGH (ref 11.5–15.5)
WBC: 4.5 10*3/uL (ref 4.0–10.5)
nRBC: 4.9 % — ABNORMAL HIGH (ref 0.0–0.2)

## 2019-04-11 MED ORDER — OXYCODONE HCL 5 MG PO TABS
15.0000 mg | ORAL_TABLET | ORAL | Status: DC | PRN
  Administered 2019-04-11 – 2019-04-13 (×7): 15 mg via ORAL
  Filled 2019-04-11 (×7): qty 3

## 2019-04-11 NOTE — Progress Notes (Signed)
PT Cancellation Note  Patient Details Name: Kristen Roman MRN: [id number] DOB: Nov 22, 1960   Cancelled Treatment:    Reason Eval/Treat Not Completed: Patient declined, no reason specified  Pt stated "Not right now."  Tried to encourage and educate on importance of walking and OOB activity, but pt stated "It ain't going to be today."  Will f/u as able.  [name], PT Acute Rehab Services Pager [phone] [facility] 04/11/2019, 3:19 PM

## 2019-04-11 NOTE — Progress Notes (Signed)
Nutrition Follow-up  RD working remotely.   DOCUMENTATION CODES:   Not applicable  INTERVENTION:  - continue Boost Breeze BID and Magic Cup BID. - continue to encourage PO intakes.    NUTRITION DIAGNOSIS:   Inadequate oral intake related to poor appetite as evidenced by per patient/family report, meal completion < 50%. -improving  GOAL:   Patient will meet greater than or equal to 90% of their needs -unmet on average   MONITOR:   PO intake, Supplement acceptance, Labs, Weight trends, Skin  ASSESSMENT:   59 y.o. 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 Of note, patient recently discontinued hospice, has poor insight to medical problems and a poor historian. Patient is a reluctant historian with unclear insight to her condition. Pt also reports intermittent blood in her stool over the last week, unable to give further info.  Per chart review, patient has been consuming 25-75% of meals over the past 1 week. She has been accepting Boost Breeze 50-75% of the time offered, with decreased acceptance more recently.   Patient is a/o x4. Notes indicate that patient is medically stable pending bed availability at SNF. She has not been weighed since admission on 12/24.   Notes indicate that patient has been hiding pills and lying about taking them throughout hospitalization. She is HIV+ and has stage 4 parotid adenocarcinoma with mets to brain/meninges with chronic R-sided facial droop. She is not interested in hospice and has refused comfort measures. Oncology has determined that she is not a candidate for further treatment and that her cancer is terminal    Labs reviewed; Na: 133 mmol/l, creatinine: 0.39 mg/dl.  Medications reviewed; 1 mg folvite/day, daily multivitamin with minerals, 40 mg oral protonix  BID, 1 packet miralax BID, 2 tablets senokot BID.     NUTRITION - FOCUSED PHYSICAL EXAM:  unable to complete at this time.   Diet Order:   Diet Order            Diet regular Room service appropriate? Yes; Fluid consistency: Thin  Diet effective now              EDUCATION NEEDS:   No education needs have been identified at this time  Skin:  Skin Assessment: Skin Integrity Issues: Skin Integrity Issues:: Stage II Stage II: coccyx  Last BM:  1/5  Height:   Ht Readings from Last 1 Encounters:  03/29/19 5' 4.96" (1.65 m)    Weight:   Wt Readings from Last 1 Encounters:  03/29/19 60 kg    Ideal Body Weight:  56.8 kg  BMI:  Body mass index is 22.04 kg/m.  Estimated Nutritional Needs:   Kcal:  1500-1700  Protein:  70-80g  Fluid:  1.7L/day     Jarome Matin, MS, RD, LDN, CNSC Inpatient Clinical Dietitian Pager # [phone] After hours/weekend pager # [phone]

## 2019-04-11 NOTE — TOC Progression Note (Signed)
Transition of Care [facility]) - Progression Note    Patient Details  Name: Kristen Roman MRN: [id number] Date of Birth: 12-13-1960  Transition of Care [facility]) CM/SW Contact  Laela Deviney, Marjie Skiff, RN Phone Number: 04/11/2019, 1:12 PM  Clinical Narrative:    Passr received: [id number]. Still no SNF bed offers at this time. TOC will continue to follow.   Expected Discharge Plan: Skilled Nursing Facility Barriers to Discharge: [place] (PASRR), No SNF bed(No SNF bed offers, PASRR level II, [place] Must Closed in observance of the Holiday.)  Expected Discharge Plan and Services Expected Discharge Plan: [place] In-house Referral: Clinical Social Work   Post Acute Care Choice: [place] Living arrangements for the past 2 months: Single Family Home                                       Social Determinants of Health (SDOH) Interventions    Readmission Risk Interventions No flowsheet data found.

## 2019-04-11 NOTE — Progress Notes (Signed)
PROGRESS NOTE  Kristen Roman [id number] DOB: 1960/11/16 DOA: 03/29/2019 PCP: [place]  Brief History:  Kristen Roman is an 59 y.o. 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 Of note, patient recently discontinued hospice, has poor insight to medical problems and a poor historian. She complained of intermittent blood in her stool. In the ED, she was hemodynamically stable except for some mild tachycardia, lab work notable for pancytopenia with a significant drop in hemoglobin to 5.3, FOBT positive, CT head with no significant acute changes. COVID-19 negative. Patient admitted for further management. During the hospitalization, she has frequently hidden her daily pills and not taken them as directed.   Assessment/Plan:  Acute on chronic blood loss anemia likely 2/2 GI bleed Hemoglobin 5.3 on admission, baseline around 8, hemoglobin fluctuating FOBT positive S/P 2 units PRBC Continue Protonix GI consulted, no further work-up due to very poor prognosis Hgb remains stable -baseline Hgb 7-8 prior to admission  Neutropenic fever 2/2 HCAP, She was receiving vancomycin and aztreonam. MRSA PCR was negative so vancomycin was discontinued. Aztreonam was discontinued in favor of cefepime since it does provide some gram-positive coverage Completed 7 days of antibiotics as mentioned above -now remained stable and afebrile since finishing abx on 04/05/19  Chronic pancytopenia/severe thrombocytopenia Likely related to malignancy as well as HIV She received 1 unit of platelets on 03/31/2019 Heme-onc consulted, no further intervention, recommend hospice Daily CBC Monitor for signs of bleeding--none presently -platelet largely stable ~60K  HIV CD4 count 248/15% on  04/02/2019 -HIV VL= 124K on 03/30/19 doubt compliance--she has hidden her pills during hospitalization and lied about taking them Continue Biktarvy  Stage IV parotid adenocarcinoma with mets to brain/meninges Chronic right-sided facial droop  No longer interested in hospice as she did not like the way she was treated Continue home Decadron to reduce neurogenic edema Seen by oncology who did not have any further treatments to offer and has told the patient that her cancer is terminal. Pain management PT recommended discharge to SNF, patient continues to refuse hospice. Per palliative care, she is not ready for comfort measures.  Bipolar disorder Continue psychotropics  Left buttock stage II decubitus ulcer -present on admission -local wound care      Disposition Plan:   SNF when bed available--stable for d/c Family Communication:   Family at bedside  Consultants:  Med onc, GI  Code Status:  DNR  DVT Prophylaxis:  SCDs    Procedures: As Listed in Progress Note Above  Antibiotics: None      Subjective: Patient denies fevers, chills, headache, chest pain, dyspnea, nausea, vomiting, diarrhea, abdominal pain, dysuria, hematuria, hematochezia, and melena.   Objective: Vitals:   04/10/19 1351 04/10/19 2211 04/10/19 2211 04/11/19 0548  BP: 132/69 (!) 179/70 (!) 179/70 (!) 163/91  Pulse: 64  74 68  Resp: 18  16 20   Temp: 98.8 F (37.1 C)  99.4 F (37.4 C) 99.5 F (37.5 C)  TempSrc: Oral  Oral Oral  SpO2: 100%  97% 97%  Weight:      Height:        Intake/Output Summary (Last 24 hours) at 04/11/2019 1427 Last data filed at 04/11/2019 0500 Gross per 24 hour  Intake 240 ml  Output --  Net 240 ml   Weight change:  Exam:   General:  Pt is alert, follows commands appropriately, not in acute distress  HEENT: No icterus, No thrush, No neck mass, [place]/AT  Cardiovascular: RRR, S1/S2, no rubs, no gallops  Respiratory: bibasilar rales. No wheeze  Abdomen:  Soft/+BS, non tender, non distended, no guarding  Extremities: No edema, No lymphangitis, No petechiae, No rashes, no synovitis   Data Reviewed: I have personally reviewed following labs and imaging studies Basic Metabolic Panel: No results for input(s): NA, K, CL, CO2, GLUCOSE, BUN, CREATININE, CALCIUM, MG, PHOS in the last 168 hours. Liver Function Tests: No results for input(s): AST, ALT, ALKPHOS, BILITOT, PROT, ALBUMIN in the last 168 hours. No results for input(s): LIPASE, AMYLASE in the last 168 hours. No results for input(s): AMMONIA in the last 168 hours. Coagulation Profile: No results for input(s): INR, PROTIME in the last 168 hours. CBC: Recent Labs  Lab 04/07/19 0458 04/08/19 0529 04/09/19 0513 04/10/19 0425 04/11/19 0500  WBC 4.6 5.7 6.0 6.5 4.5  NEUTROABS 1.5* 1.9 2.8 2.2 1.8  HGB 7.1* 7.4* 7.3* 7.2* 7.4*  HCT 23.3* 23.9* 23.9* 24.1* 24.1*  MCV 85.3 85.7 85.4 86.1 84.0  PLT 38* 45* 58* 63* 61*   Cardiac Enzymes: No results for input(s): CKTOTAL, CKMB, CKMBINDEX, TROPONINI in the last 168 hours. BNP: Invalid input(s): POCBNP CBG: No results for input(s): GLUCAP in the last 168 hours. HbA1C: No results for input(s): HGBA1C in the last 72 hours. Urine analysis:    Component Value Date/Time   COLORURINE YELLOW 03/30/2019 0600   APPEARANCEUR CLEAR 03/30/2019 0600   LABSPEC 1.013 03/30/2019 0600   PHURINE 6.0 03/30/2019 0600   GLUCOSEU NEGATIVE 03/30/2019 0600   GLUCOSEU NEG mg/dL 12/29/2006 2001   HGBUR NEGATIVE 03/30/2019 0600   BILIRUBINUR NEGATIVE 03/30/2019 0600   KETONESUR NEGATIVE 03/30/2019 0600   PROTEINUR NEGATIVE 03/30/2019 0600   UROBILINOGEN 1 12/29/2006 2001   NITRITE NEGATIVE 03/30/2019 0600   LEUKOCYTESUR NEGATIVE 03/30/2019 0600   Sepsis Labs: @LABRCNTIP (procalcitonin:4,lacticidven:4) ) Recent Results (from the past 240 hour(s))  MRSA PCR Screening     Status: None   Collection Time: 04/02/19 10:17 AM   Specimen: Nasal Mucosa;  Nasopharyngeal  Result Value Ref Range Status   MRSA by PCR NEGATIVE NEGATIVE Final    Comment:        The GeneXpert MRSA Assay (FDA approved for NASAL specimens only), is one component of a comprehensive MRSA colonization surveillance program. It is not intended to diagnose MRSA infection nor to guide or monitor treatment for MRSA infections. Performed at [facility], [place] [address] [place], [place] [address]      Scheduled Meds: . amLODipine  5 mg Oral Daily  . bictegravir-emtricitabine-tenofovir AF  1 tablet Oral Daily  . citalopram  10 mg Oral Daily  . dexamethasone  4 mg Oral q morning - 10a  . feeding supplement  1 Container Oral BID BM  . fentaNYL  1 patch Transdermal Q72H  . folic acid  1 mg Oral Daily  . hydrALAZINE  50 mg Oral Q8H  . multivitamin with minerals  1 tablet Oral Daily  . pantoprazole  40 mg Oral BID  . polyethylene glycol  17 g Oral BID  . senna-docusate  2 tablet Oral BID  . sodium chloride flush  3 mL Intravenous Q12H   Continuous Infusions: . sodium chloride 250 mL (04/03/19 0603)    Procedures/Studies: CT Head Wo Contrast  Result Date: 03/29/2019 CLINICAL DATA:  History of radiation therapy for a right parotid tumor. Primary  of bone destruction at the skull base with a soft tissue component on recent MRI, suspicious for metastatic disease. The patient also has dural enhancement and dural based mass in the left middle cranial fossa on the recent MRI. No reported symptoms at this time. EXAM: CT HEAD WITHOUT CONTRAST TECHNIQUE: Contiguous axial images were obtained from the base of the skull through the vertex without intravenous contrast. COMPARISON:  Brain MR dated 03/15/2019. Head CT dated 02/06/2019. FINDINGS: Brain: Stable mildly enlarged ventricles and cortical sulci. Oval area of low density in the left temporal lobe in the middle cranial fossa, corresponding to the mass seen on the recent MRI. Calcification in the pituitary  gland with no pituitary mass seen on the recent MR. No intracranial hemorrhage or CT evidence of acute infarction. Vascular: No hyperdense vessel or unexpected calcification. Skull: Previously demonstrated permeative bone destruction at the skull base is again demonstrated, limited by motion artifacts on the current images. Again demonstrated is opacification of the mastoid air cells on the right. Sinuses/Orbits: The included portions are unremarkable, limited by motion artifacts. Right mastoid air cell opacification with mild progression since 02/06/2019. Other: None. IMPRESSION: 1. Limited examination due to motion artifacts with no acute abnormality seen. 2. Grossly stable left temporal lobe mass, compatible with the patient's known tumor. 3. Grossly stable permeative bone destruction at the skull base and mildly progressive opacification of the right mastoid air cells, suspicious for metastatic disease. 4. Pituitary gland calcifications, possibly due to the patient's previous radiation therapy. 5. Stable mild diffuse cerebral and cerebellar atrophy. Electronically Signed   By: Claudie Revering M.D.   On: 03/29/2019 14:40   CT ANGIO CHEST PE W OR WO CONTRAST  Result Date: 03/30/2019 CLINICAL DATA:  Shortness of breath. Evaluate for pulmonary embolus. EXAM: CT ANGIOGRAPHY CHEST WITH CONTRAST TECHNIQUE: Multidetector CT imaging of the chest was performed using the standard protocol during bolus administration of intravenous contrast. Multiplanar CT image reconstructions and MIPs were obtained to evaluate the vascular anatomy. CONTRAST:  54mL OMNIPAQUE IOHEXOL 350 MG/ML SOLN COMPARISON:  02/03/2019 FINDINGS: Cardiovascular: Heart is enlarged. Coronary artery calcification is evident. Atherosclerotic calcification is noted in the wall of the thoracic aorta. No filling defect within the opacified pulmonary arteries to suggest the presence of an acute pulmonary embolus Mediastinum/Nodes: No mediastinal  lymphadenopathy. There is no hilar lymphadenopathy. The esophagus has normal imaging features. There is no axillary lymphadenopathy. Lungs/Pleura: Bandlike areas of subpleural opacity are seen in the lower lungs bilaterally, right greater than left. This is likely atelectatic although component of infectious/inflammatory etiology not excluded. No pneumothorax or pleural effusion. Upper Abdomen: Unremarkable. Musculoskeletal: The widespread bony metastatic disease is similar in the interval. Review of the MIP images confirms the above findings. IMPRESSION: 1. No CT evidence for acute pulmonary embolus. 2. Areas of subpleural bandlike consolidation in the lower lungs, right greater than left. This is probably mainly atelectatic although component of underlying infectious/inflammatory etiology not entirely excluded. 3. Similar appearance widespread bony metastatic disease. Electronically Signed   By: Misty Stanley M.D.   On: 03/30/2019 14:51   DG Chest Port 1 View  Result Date: 03/30/2019 CLINICAL DATA:  Fever EXAM: PORTABLE CHEST 1 VIEW COMPARISON:  February 08, 2019, chest CT February 02, 2019 FINDINGS: The mediastinal contour and cardiac silhouette are stable. The aorta is tortuous. There is interval developed opacity of the right perihilar region. There is no pulmonary edema or pleural effusion. Degenerative joint changes of bilateral shoulders are noted. IMPRESSION: New opacity of  the right perihilar region. Further evaluation with a chest CT is recommended to exclude underlying mass. Electronically Signed   By: Abelardo Diesel M.D.   On: 03/30/2019 08:18    Orson Eva, DO  [facility] Pager [phone]  If 7PM-7AM, please contact night-coverage [email or web address] Password [email or web address] 04/11/2019, 2:27 PM   LOS: 13 days

## 2019-04-12 LAB — CBC WITH DIFFERENTIAL/PLATELET
Abs Immature Granulocytes: 0.5 10*3/uL — ABNORMAL HIGH (ref 0.00–0.07)
Band Neutrophils: 11 %
Basophils Absolute: 0 10*3/uL (ref 0.0–0.1)
Basophils Relative: 0 %
Eosinophils Absolute: 0 10*3/uL (ref 0.0–0.5)
Eosinophils Relative: 0 %
HCT: 23.5 % — ABNORMAL LOW (ref 36.0–46.0)
Hemoglobin: 7.5 g/dL — ABNORMAL LOW (ref 12.0–15.0)
Lymphocytes Relative: 18 %
Lymphs Abs: 0.9 10*3/uL (ref 0.7–4.0)
MCH: 25.9 pg — ABNORMAL LOW (ref 26.0–34.0)
MCHC: 31.9 g/dL (ref 30.0–36.0)
MCV: 81 fL (ref 80.0–100.0)
Metamyelocytes Relative: 2 %
Monocytes Absolute: 1.1 10*3/uL — ABNORMAL HIGH (ref 0.1–1.0)
Monocytes Relative: 21 %
Myelocytes: 8 %
Neutro Abs: 2.6 10*3/uL (ref 1.7–7.7)
Neutrophils Relative %: 40 %
Platelets: 65 10*3/uL — ABNORMAL LOW (ref 150–400)
RBC: 2.9 MIL/uL — ABNORMAL LOW (ref 3.87–5.11)
RDW: 17 % — ABNORMAL HIGH (ref 11.5–15.5)
WBC: 5.1 10*3/uL (ref 4.0–10.5)
nRBC: 3.5 % — ABNORMAL HIGH (ref 0.0–0.2)

## 2019-04-12 NOTE — Progress Notes (Signed)
PROGRESS NOTE  Kristen Roman [id number] DOB: [birth year] DOA: 03/29/2019 PCP: [place]  Brief History:  Kristen Roman is an 59 y.o. 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 Of note, patient recently discontinued hospice, has poor insight to medical problems and a poor historian. She complained of intermittent blood in her stool. In the ED, she was hemodynamically stable except for some mild tachycardia, lab work notable for pancytopenia with a significant drop in hemoglobin to 5.3, FOBT positive, CT head with no significant acute changes. COVID-19 negative. Patient admitted for further management. During the hospitalization, she has frequently hidden her daily pills and not taken them as directed.   Assessment/Plan:  Acute on chronic blood loss anemia likely 2/2 GI bleed Hemoglobin 5.3 on admission, baseline around 8, hemoglobin fluctuating FOBT positive S/P 2 units PRBC Continue Protonix GI consulted, no further work-up due to very poor prognosis Hgb remains stable -baseline Hgb 7-8 prior to admission  Neutropenic fever 2/2 HCAP, She was receiving vancomycin and aztreonam. MRSA PCR was negative so vancomycin was discontinued. Aztreonam was discontinued in favor of cefepime since it does provide some gram-positive coverage Completed 7 days of antibiotics as mentioned above -now remained stable and afebrile since finishing abx on 04/05/19  Chronic pancytopenia/severe thrombocytopenia Likely related to malignancy as well as HIV She received 1 unit of platelets on 03/31/2019 Heme-onc consulted, no further intervention, recommend hospice Daily CBC Monitor for signs of bleeding--none presently -platelet largely stable ~60K  HIV CD4 count 248/15% on  04/02/2019 -HIV VL= 124K on 03/30/19 doubt compliance--she has hidden her pills during hospitalization and lied about taking them Continue Biktarvy  Stage IV parotid adenocarcinoma with mets to brain/meninges Chronic right-sided facial droop  No longer interested in hospice as she did not like the way she was treated Continue home Decadron to reduce neurogenic edema Seen by oncology who did not have any further treatments to offer and has told the patient that her cancer is terminal. Pain management PT recommended discharge to SNF, patient continues to refuse hospice. Per palliative care, she is not ready for comfort measures.  Bipolar disorder Continue psychotropics  Left buttock stage II decubitus ulcer -present on admission -local wound care      Disposition Plan:   SNF when bed available--stable for d/c Family Communication:   Family at bedside  Consultants:  Med onc, GI  Code Status:  DNR  DVT Prophylaxis:  SCDs    Procedures:   Antibiotics: None      Subjective: Reports continued generalized body ache.  No further constipation no nausea no vomiting.  No fever no chills.   Objective: Vitals:   04/11/19 1437 04/11/19 2115 04/12/19 0606 04/12/19 1319  BP: (!) 143/74 (!) 151/92 126/68 (!) 143/75  Pulse: 65 66 70 (!) 59  Resp: 16 18 17 16   Temp: 98.6 F (37 C) 99.3 F (37.4 C) 100.2 F (37.9 C) 99.1 F (37.3 C)  TempSrc: Oral Oral Oral Oral  SpO2: 99% 95% 95% 96%  Weight:      Height:        Intake/Output Summary (Last 24 hours) at 04/12/2019 1545 Last data filed at 04/12/2019 1100 Gross per 24 hour  Intake 360 ml  Output 1000 ml  Net -640 ml   Weight change:  Exam:   General:  Pt is alert, follows commands appropriately, not in acute distress  HEENT: No icterus, No thrush, No neck mass, [place]/AT  Cardiovascular: RRR, S1/S2, no rubs, no gallops  Respiratory: bibasilar rales. No wheeze  Abdomen: Soft/+BS, non tender, non distended, no  guarding  Extremities: No edema, No lymphangitis, No petechiae, No rashes, no synovitis   Data Reviewed: I have personally reviewed following labs and imaging studies Basic Metabolic Panel: No results for input(s): NA, K, CL, CO2, GLUCOSE, BUN, CREATININE, CALCIUM, MG, PHOS in the last 168 hours. Liver Function Tests: No results for input(s): AST, ALT, ALKPHOS, BILITOT, PROT, ALBUMIN in the last 168 hours. No results for input(s): LIPASE, AMYLASE in the last 168 hours. No results for input(s): AMMONIA in the last 168 hours. Coagulation Profile: No results for input(s): INR, PROTIME in the last 168 hours. CBC: Recent Labs  Lab 04/08/19 0529 04/09/19 0513 04/10/19 0425 04/11/19 0500 04/12/19 0528  WBC 5.7 6.0 6.5 4.5 5.1  NEUTROABS 1.9 2.8 2.2 1.8 2.6  HGB 7.4* 7.3* 7.2* 7.4* 7.5*  HCT 23.9* 23.9* 24.1* 24.1* 23.5*  MCV 85.7 85.4 86.1 84.0 81.0  PLT 45* 58* 63* 61* 65*   Cardiac Enzymes: No results for input(s): CKTOTAL, CKMB, CKMBINDEX, TROPONINI in the last 168 hours. BNP: Invalid input(s): POCBNP CBG: No results for input(s): GLUCAP in the last 168 hours. HbA1C: No results for input(s): HGBA1C in the last 72 hours. Urine analysis:    Component Value Date/Time   COLORURINE YELLOW 03/30/2019 0600   APPEARANCEUR CLEAR 03/30/2019 0600   LABSPEC 1.013 03/30/2019 0600   PHURINE 6.0 03/30/2019 0600   GLUCOSEU NEGATIVE 03/30/2019 0600   GLUCOSEU NEG mg/dL 12/29/2006 2001   HGBUR NEGATIVE 03/30/2019 0600   BILIRUBINUR NEGATIVE 03/30/2019 0600   KETONESUR NEGATIVE 03/30/2019 0600   PROTEINUR NEGATIVE 03/30/2019 0600   UROBILINOGEN 1 12/29/2006 2001   NITRITE NEGATIVE 03/30/2019 0600   LEUKOCYTESUR NEGATIVE 03/30/2019 0600   Sepsis Labs: @LABRCNTIP (procalcitonin:4,lacticidven:4) ) No results found for this or any previous visit (from the past 240 hour(s)).   Scheduled Meds:  amLODipine  5 mg Oral Daily   bictegravir-emtricitabine-tenofovir AF  1 tablet Oral Daily    citalopram  10 mg Oral Daily   dexamethasone  4 mg Oral q morning - 10a   feeding supplement  1 Container Oral BID BM   fentaNYL  1 patch Transdermal [id number]   folic acid  1 mg Oral Daily   hydrALAZINE  50 mg Oral Q8H   multivitamin with minerals  1 tablet Oral Daily   pantoprazole  40 mg Oral BID   polyethylene glycol  17 g Oral BID   senna-docusate  2 tablet Oral BID   sodium chloride flush  3 mL Intravenous Q12H   Continuous Infusions:  sodium chloride 250 mL (04/03/19 0603)    Procedures/Studies: CT Head Wo Contrast  Result Date: 03/29/2019 CLINICAL DATA:  History of radiation therapy for a right parotid tumor. Primary of bone destruction at the skull base with a soft tissue component on recent MRI, suspicious for metastatic disease. The patient also has dural enhancement and dural based mass in the left middle cranial fossa on the recent MRI. No reported symptoms at this time. EXAM: CT HEAD WITHOUT CONTRAST TECHNIQUE: Contiguous axial images were obtained from the base of the skull through the vertex without intravenous contrast. COMPARISON:  Brain MR dated 03/15/2019. Head CT dated 02/06/2019. FINDINGS: Brain: Stable mildly enlarged ventricles and cortical sulci. Oval area of low density in the  left temporal lobe in the middle cranial fossa, corresponding to the mass seen on the recent MRI. Calcification in the pituitary gland with no pituitary mass seen on the recent MR. No intracranial hemorrhage or CT evidence of acute infarction. Vascular: No hyperdense vessel or unexpected calcification. Skull: Previously demonstrated permeative bone destruction at the skull base is again demonstrated, limited by motion artifacts on the current images. Again demonstrated is opacification of the mastoid air cells on the right. Sinuses/Orbits: The included portions are unremarkable, limited by motion artifacts. Right mastoid air cell opacification with mild progression since 02/06/2019.  Other: None. IMPRESSION: 1. Limited examination due to motion artifacts with no acute abnormality seen. 2. Grossly stable left temporal lobe mass, compatible with the patient's known tumor. 3. Grossly stable permeative bone destruction at the skull base and mildly progressive opacification of the right mastoid air cells, suspicious for metastatic disease. 4. Pituitary gland calcifications, possibly due to the patient's previous radiation therapy. 5. Stable mild diffuse cerebral and cerebellar atrophy. Electronically Signed   By: Claudie Revering M.D.   On: 03/29/2019 14:40   CT ANGIO CHEST PE W OR WO CONTRAST  Result Date: 03/30/2019 CLINICAL DATA:  Shortness of breath. Evaluate for pulmonary embolus. EXAM: CT ANGIOGRAPHY CHEST WITH CONTRAST TECHNIQUE: Multidetector CT imaging of the chest was performed using the standard protocol during bolus administration of intravenous contrast. Multiplanar CT image reconstructions and MIPs were obtained to evaluate the vascular anatomy. CONTRAST:  62mL OMNIPAQUE IOHEXOL 350 MG/ML SOLN COMPARISON:  02/03/2019 FINDINGS: Cardiovascular: Heart is enlarged. Coronary artery calcification is evident. Atherosclerotic calcification is noted in the wall of the thoracic aorta. No filling defect within the opacified pulmonary arteries to suggest the presence of an acute pulmonary embolus Mediastinum/Nodes: No mediastinal lymphadenopathy. There is no hilar lymphadenopathy. The esophagus has normal imaging features. There is no axillary lymphadenopathy. Lungs/Pleura: Bandlike areas of subpleural opacity are seen in the lower lungs bilaterally, right greater than left. This is likely atelectatic although component of infectious/inflammatory etiology not excluded. No pneumothorax or pleural effusion. Upper Abdomen: Unremarkable. Musculoskeletal: The widespread bony metastatic disease is similar in the interval. Review of the MIP images confirms the above findings. IMPRESSION: 1. No CT  evidence for acute pulmonary embolus. 2. Areas of subpleural bandlike consolidation in the lower lungs, right greater than left. This is probably mainly atelectatic although component of underlying infectious/inflammatory etiology not entirely excluded. 3. Similar appearance widespread bony metastatic disease. Electronically Signed   By: Misty Stanley M.D.   On: 03/30/2019 14:51   DG Chest Port 1 View  Result Date: 03/30/2019 CLINICAL DATA:  Fever EXAM: PORTABLE CHEST 1 VIEW COMPARISON:  February 08, 2019, chest CT February 02, 2019 FINDINGS: The mediastinal contour and cardiac silhouette are stable. The aorta is tortuous. There is interval developed opacity of the right perihilar region. There is no pulmonary edema or pleural effusion. Degenerative joint changes of bilateral shoulders are noted. IMPRESSION: New opacity of the right perihilar region. Further evaluation with a chest CT is recommended to exclude underlying mass. Electronically Signed   By: Abelardo Diesel M.D.   On: 03/30/2019 08:18    Berle Mull,  [facility]  If 7PM-7AM, please contact night-coverage [email or web address] Password [email or web address] 04/12/2019, 3:45 PM   LOS: 14 days

## 2019-04-12 NOTE — Plan of Care (Signed)
  Problem: Education: Goal: Ability to identify signs and symptoms of gastrointestinal bleeding will improve Outcome: Progressing   Problem: Bowel/Gastric: Goal: Will show no signs and symptoms of gastrointestinal bleeding Outcome: Progressing   Problem: Fluid Volume: Goal: Will show no signs and symptoms of excessive bleeding Outcome: Progressing   Problem: Clinical Measurements: Goal: Complications related to the disease process, condition or treatment will be avoided or minimized Outcome: Progressing   Problem: Education: Goal: Knowledge of General Education information will improve Description: Including pain rating scale, medication(s)/side effects and non-pharmacologic comfort measures Outcome: Progressing   Problem: Health Behavior/Discharge Planning: Goal: Ability to manage health-related needs will improve Outcome: Progressing   Problem: Clinical Measurements: Goal: Ability to maintain clinical measurements within normal limits will improve Outcome: Progressing Goal: Will remain free from infection Outcome: Progressing   Problem: Activity: Goal: Risk for activity intolerance will decrease Outcome: Progressing   Problem: Nutrition: Goal: Adequate nutrition will be maintained Outcome: Progressing   Problem: Coping: Goal: Level of anxiety will decrease Outcome: Progressing   Problem: Pain Managment: Goal: General experience of comfort will improve Outcome: Progressing   Problem: Safety: Goal: Ability to remain free from injury will improve Outcome: Progressing   Problem: Skin Integrity: Goal: Risk for impaired skin integrity will decrease Outcome: Progressing

## 2019-04-13 LAB — CBC WITH DIFFERENTIAL/PLATELET
Abs Immature Granulocytes: 0.55 10*3/uL — ABNORMAL HIGH (ref 0.00–0.07)
Basophils Absolute: 0 10*3/uL (ref 0.0–0.1)
Basophils Relative: 1 %
Eosinophils Absolute: 0 10*3/uL (ref 0.0–0.5)
Eosinophils Relative: 1 %
HCT: 22.4 % — ABNORMAL LOW (ref 36.0–46.0)
Hemoglobin: 6.9 g/dL — CL (ref 12.0–15.0)
Immature Granulocytes: 11 %
Lymphocytes Relative: 31 %
Lymphs Abs: 1.6 10*3/uL (ref 0.7–4.0)
MCH: 25.9 pg — ABNORMAL LOW (ref 26.0–34.0)
MCHC: 30.8 g/dL (ref 30.0–36.0)
MCV: 84.2 fL (ref 80.0–100.0)
Monocytes Absolute: 1 10*3/uL (ref 0.1–1.0)
Monocytes Relative: 19 %
Neutro Abs: 2 10*3/uL (ref 1.7–7.7)
Neutrophils Relative %: 37 %
Platelets: 63 10*3/uL — ABNORMAL LOW (ref 150–400)
RBC: 2.66 MIL/uL — ABNORMAL LOW (ref 3.87–5.11)
RDW: 17.2 % — ABNORMAL HIGH (ref 11.5–15.5)
WBC: 5.2 10*3/uL (ref 4.0–10.5)
nRBC: 5.4 % — ABNORMAL HIGH (ref 0.0–0.2)

## 2019-04-13 LAB — PREPARE RBC (CROSSMATCH)

## 2019-04-13 MED ORDER — SODIUM CHLORIDE 0.9% IV SOLUTION: Freq: Once | INTRAVENOUS | Status: DC

## 2019-04-13 MED ORDER — LIP MEDEX EX OINT
TOPICAL_OINTMENT | CUTANEOUS | Status: AC
  Filled 2019-04-13: qty 7

## 2019-04-13 MED ORDER — OXYCODONE HCL 5 MG PO TABS
20.0000 mg | ORAL_TABLET | ORAL | Status: DC | PRN
  Administered 2019-04-13 – 2019-04-20 (×41): 20 mg via ORAL
  Filled 2019-04-13 (×42): qty 4

## 2019-04-13 NOTE — Progress Notes (Signed)
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  She was recently on hospice but she recently discontinued.  She has poor insight of her medical problems.  She has complained of intermittent blood in her stool and fecal occult was positive on admission and hemoglobin was 5.3.  Per report and prior notes, during this hospitalization she has frequently hidden her daily pills and not take them as directed.  Subjective / 24h Interval events: Complains of persistent headache today, states that morphine does not work but would like her oral meds increased  Assessment & Plan: Principal Problem Acute on chronic blood loss anemia likely due to GI bleed -Hemoglobin was 5.3 on admission, her baseline is around 8.  She was fecal occult positive and gastroenterology was consulted, however given advanced metastatic cancer, thrombocytopenia, she does not seem to be a stable candidate for further work-up due to poor prognosis and recommended monitoring at this point. -Hemoglobin again dropped today to 6.9, will go ahead and transfuse 1 additional unit of packed red blood cells -Continue PPI  Active Problems Neutropenic fever, HCAP -She was initially placed on broad-spectrum antibiotics, completed 7 days of antibiotics and has remained stable and afebrile since finishing antibiotics on 04/05/2019  Chronic pancytopenia/severe thrombocytopenia -Like related to malignancy as well as underlying HIV.  She is status post a unit of platelets on 03/31/2019, platelets at this point are overall stable -Heme onc consulted, no further intervention recommending hospice at this  point -Platelets overall stable  HIV -There is a question about compliance and per report and notes patient has hidden pills during hospitalization 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 -Chronic right-sided facial droop, no longer interested in hospice as she did not like the way she was treated -Continue home Decadron to reduce neurogenic edema -Seen by oncology and no further treatments are to be offered and the cancer is terminal -For now symptomatic management -PT evaluated patient but recommended SNF (per patient preference), bit unclear to me from a functional standpoint whether she could be home with home health versus needing skilled level of care.  I have been contacted by care management regarding sending the patient home since there are no bed offers.  I will await reevaluation by physical therapy to best determine safe discharge planning  Bipolar disorder -Continuepsychotropics  Left buttock stage II decubitus ulcer -present on admission -local wound care  Scheduled Meds: . sodium chloride   Intravenous Once  . amLODipine  5 mg Oral Daily  . bictegravir-emtricitabine-tenofovir AF  1 tablet Oral Daily  . citalopram  10 mg Oral Daily  . dexamethasone  4 mg Oral q morning - 10a  . feeding supplement  1 Container Oral BID BM  . fentaNYL  1 patch Transdermal Q72H  . folic acid  1 mg Oral Daily  . hydrALAZINE  50 mg Oral Q8H  . lip balm      . multivitamin with minerals  1 tablet Oral Daily  . pantoprazole  40 mg Oral BID  . polyethylene glycol  17 g Oral BID  . senna-docusate  2 tablet Oral BID  . sodium chloride flush  3 mL Intravenous Q12H   Continuous Infusions: .  sodium chloride 250 mL (04/03/19 0603)   PRN Meds:.sodium chloride, acetaminophen **OR** acetaminophen, ALPRAZolam, bisacodyl, diphenhydrAMINE, hydrALAZINE, ondansetron **OR** ondansetron (ZOFRAN) IV, oxyCODONE, simethicone, zolpidem  DVT prophylaxis:  SCDs Code Status: DNR Family Communication: d/w patient  Disposition Plan: SNF vs home, will ask PT to re-evaluate  Consultants:  Oncology  Palliative care  Infectious disease Gastroenterology  Procedures:  None   Microbiology  None   Antimicrobials: None     Objective: Vitals:   04/13/19 1116 04/13/19 1131 04/13/19 1245 04/13/19 1420  BP: 134/86 135/70 (!) 141/78 (!) 157/94  Pulse: 71 64 64 64  Resp: 15 14 16    Temp: 98.5 F (36.9 C) 98.3 F (36.8 C) 99.2 F (37.3 C) 98.7 F (37.1 C)  TempSrc: Oral Oral Oral Oral  SpO2: 98% 98% 98% 98%  Weight:      Height:        Intake/Output Summary (Last 24 hours) at 04/13/2019 1442 Last data filed at 04/13/2019 1410 Gross per 24 hour  Intake 1194 ml  Output 250 ml  Net 944 ml   Filed Weights   03/29/19 2100  Weight: 60 kg    Examination:  Constitutional: NAD Eyes: no scleral icterus ENMT: Mucous membranes are moist.  Respiratory: clear to auscultation bilaterally, no wheezing, no crackles. Normal respiratory effort.  Cardiovascular: Regular rate and rhythm, no murmurs / rubs / gallops. No LE edema.  Abdomen: non distended, no tenderness. Bowel sounds positive.  Musculoskeletal: no clubbing / cyanosis.  Skin: no rashes Neurologic: Facial droop noted, no focal deficits otherwise  Data Reviewed: I have independently reviewed following labs and imaging studies   CBC: Recent Labs  Lab 04/09/19 0513 04/10/19 0425 04/11/19 0500 04/12/19 0528 04/13/19 0438  WBC 6.0 6.5 4.5 5.1 5.2  NEUTROABS 2.8 2.2 1.8 2.6 2.0  HGB 7.3* 7.2* 7.4* 7.5* 6.9*  HCT 23.9* 24.1* 24.1* 23.5* 22.4*  MCV 85.4 86.1 84.0 81.0 84.2  PLT 58* 63* 61* 65* 63*   Basic Metabolic Panel: No results for input(s): NA, K, CL, CO2, GLUCOSE, BUN, CREATININE, CALCIUM, MG, PHOS in the last 168 hours. Liver Function Tests: No results for input(s): AST, ALT, ALKPHOS, BILITOT, PROT, ALBUMIN in the last 168 hours. Coagulation Profile: No results for  input(s): INR, PROTIME in the last 168 hours. HbA1C: No results for input(s): HGBA1C in the last 72 hours. CBG: No results for input(s): GLUCAP in the last 168 hours.  No results found for this or any previous visit (from the past 240 hour(s)).   Radiology Studies: No results found.  Marzetta Board, MD, PhD [facility]  Between 7 am - 7 pm I am available, please contact me via Amion or Securechat  Between 7 pm - 7 am I am not available, please contact night coverage MD/APP via Amion

## 2019-04-13 NOTE — Progress Notes (Addendum)
Physical Therapy Treatment Patient Details Name: Kristen Roman MRN: [id number] DOB: [birth year] Today's Date: 04/13/2019    History of Present Illness 59 yo female admitted with anemia, weakness, diplopia, neutropenic fever with possible sepsis. Hx of HIV, L2 compression fx, falls, adenocarcinoma of parotid gland with mets, bipolar d/o, polysubstance abuse    PT Comments    Pt agreeable to ambulate and able to tolerate 160 feet with RW.  Pt appears steady with RW.  Pt states she would like to d/c to SNF however she is mobilizing well.  Pt reports wanting more assist and especially mentions "for nighttime."  Pt has often not participated in therapy or performed minimally so uncertain she would fare well in rehab setting.  Pt might be more agreeable to d/c home if increased home care possible especially at night.    Follow Up Recommendations  Home health PT;Supervision/Assistance - 24 hour;SNF     Equipment Recommendations  None recommended by PT    Recommendations for Other Services       Precautions / Restrictions Precautions Precautions: Fall    Mobility  Bed Mobility Overal bed mobility: Modified Independent                Transfers Overall transfer level: Needs assistance Equipment used: Rolling walker (2 wheeled) Transfers: Sit to/from Stand Sit to Stand: Supervision            Ambulation/Gait Ambulation/Gait assistance: Min guard;Supervision Gait Distance (Feet): 160 Feet Assistive device: Rolling walker (2 wheeled) Gait Pattern/deviations: Step-through pattern;Decreased stride length     General Gait Details: no LOB or unsteadiness with use of RW, pt reports fatigue upon returning to room   Stairs             Wheelchair Mobility    Modified Rankin (Stroke Patients Only)       Balance                                            Cognition Arousal/Alertness: Awake/alert Behavior During Therapy: WFL for tasks  assessed/performed Overall Cognitive Status: Within Functional Limits for tasks assessed                                        Exercises      General Comments        Pertinent Vitals/Pain Pain Assessment: No/denies pain    Home Living                      Prior Function            PT Goals (current goals can now be found in the care plan section) Progress towards PT goals: Progressing toward goals    Frequency    Min 2X/week      PT Plan Discharge plan needs to be updated    Co-evaluation              AM-PAC PT "6 Clicks" Mobility   Outcome Measure  Help needed turning from your back to your side while in a flat bed without using bedrails?: None Help needed moving from lying on your back to sitting on the side of a flat bed without using bedrails?: None Help needed moving to and from a bed to a chair (  including a wheelchair)?: A Little Help needed standing up from a chair using your arms (e.g., wheelchair or bedside chair)?: A Little Help needed to walk in hospital room?: A Little Help needed climbing 3-5 steps with a railing? : A Little 6 Click Score: 20    End of Session   Activity Tolerance: Patient tolerated treatment well Patient left: in bed;with call bell/phone within reach;with bed alarm set   PT Visit Diagnosis: Muscle weakness (generalized) (M62.81)     Time: [id number] PT Time Calculation (min) (ACUTE ONLY): 18 min  Charges:  $Gait Training: 8-22 mins                    Arlyce Dice, DPT Acute Rehabilitation Services Office: [place] [facility] 04/13/2019, 4:18 PM

## 2019-04-13 NOTE — Progress Notes (Signed)
CRITICAL VALUE ALERT  Critical Value:Hgb 6.9  Date & Time Notied:  04/13/2019 , 0550 am  Provider Notified: Dr. Hal Hope  Orders Received/Actions taken: awaiting responce

## 2019-04-14 LAB — COMPREHENSIVE METABOLIC PANEL
ALT: 12 U/L (ref 0–44)
AST: 27 U/L (ref 15–41)
Albumin: 3.1 g/dL — ABNORMAL LOW (ref 3.5–5.0)
Alkaline Phosphatase: 90 U/L (ref 38–126)
Anion gap: 10 (ref 5–15)
BUN: 16 mg/dL (ref 6–20)
CO2: 25 mmol/L (ref 22–32)
Calcium: 9.1 mg/dL (ref 8.9–10.3)
Chloride: 97 mmol/L — ABNORMAL LOW (ref 98–111)
Creatinine, Ser: 0.53 mg/dL (ref 0.44–1.00)
GFR calc Af Amer: >60 mL/min (ref >60)
GFR calc non Af Amer: >60 mL/min (ref >60)
Glucose, Bld: 91 mg/dL (ref 70–99)
Potassium: 4.4 mmol/L (ref 3.5–5.1)
Sodium: 132 mmol/L — ABNORMAL LOW (ref 135–145)
Total Bilirubin: 0.3 mg/dL (ref 0.3–1.2)
Total Protein: 6.1 g/dL — ABNORMAL LOW (ref 6.5–8.1)

## 2019-04-14 LAB — CBC WITH DIFFERENTIAL/PLATELET
Abs Immature Granulocytes: 0.58 10*3/uL — ABNORMAL HIGH (ref 0.00–0.07)
Basophils Absolute: 0.1 10*3/uL (ref 0.0–0.1)
Basophils Relative: 1 %
Eosinophils Absolute: 0 10*3/uL (ref 0.0–0.5)
Eosinophils Relative: 0 %
HCT: 27.4 % — ABNORMAL LOW (ref 36.0–46.0)
Hemoglobin: 8.6 g/dL — ABNORMAL LOW (ref 12.0–15.0)
Immature Granulocytes: 11 %
Lymphocytes Relative: 30 %
Lymphs Abs: 1.7 10*3/uL (ref 0.7–4.0)
MCH: 27.1 pg (ref 26.0–34.0)
MCHC: 31.4 g/dL (ref 30.0–36.0)
MCV: 86.4 fL (ref 80.0–100.0)
Monocytes Absolute: 1 10*3/uL (ref 0.1–1.0)
Monocytes Relative: 18 %
Neutro Abs: 2.2 10*3/uL (ref 1.7–7.7)
Neutrophils Relative %: 40 %
Platelets: 62 10*3/uL — ABNORMAL LOW (ref 150–400)
RBC: 3.17 MIL/uL — ABNORMAL LOW (ref 3.87–5.11)
RDW: 16.2 % — ABNORMAL HIGH (ref 11.5–15.5)
WBC: 5.5 10*3/uL (ref 4.0–10.5)
nRBC: 5.4 % — ABNORMAL HIGH (ref 0.0–0.2)

## 2019-04-14 LAB — TYPE AND SCREEN
ABO/RH(D): O POS
Antibody Screen: NEGATIVE
Unit division: 0

## 2019-04-14 LAB — BPAM RBC
Blood Product Expiration Date: 202102092359
ISSUE DATE / TIME: 202101081124
Unit Type and Rh: 5100

## 2019-04-14 NOTE — Progress Notes (Signed)
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  She was recently on hospice but she recently discontinued.  She has poor insight of her medical problems.  She has complained of intermittent blood in her stool and fecal occult was positive on admission and hemoglobin was 5.3.  Per report and prior notes, during this hospitalization she has frequently hidden her daily pills and not take them as directed.  Subjective / 24h Interval events: Headache is better, however complains of generalized weakness  Assessment & Plan: Principal Problem Acute on chronic blood loss anemia likely due to GI bleed -Hemoglobin was 5.3 on admission, her baseline is around 8.  She was fecal occult positive and gastroenterology was consulted, however given advanced metastatic cancer, thrombocytopenia, she does not seem to be a stable candidate for further work-up due to poor prognosis and recommended monitoring at this point. -Hemoglobin again dropped to 6.9 on 1/8, status post an additional unit of packed red blood cells -Hemoglobin this morning stable  Active Problems Neutropenic fever, HCAP -She was initially placed on broad-spectrum antibiotics, completed 7 days of antibiotics and has remained stable and afebrile since finishing antibiotics on 04/05/2019 -Remains afebrile  Chronic pancytopenia/severe thrombocytopenia -Like related to malignancy as well as underlying HIV.  She is status post a unit of platelets on 03/31/2019, platelets at this point are overall stable -Heme onc consulted, no further intervention recommending hospice at this point -Platelets stable  today  HIV -There is a question about compliance and per report and notes patient has hidden pills during hospitalization 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 -Chronic right-sided facial droop, no longer interested in hospice as she did not like the way she was treated -Continue home Decadron to reduce neurogenic edema -Seen by oncology and no further treatments are to be offered and the cancer is terminal -For now symptomatic management -PT evaluated patient again and recommended home health but with 24-hour supervision versus SNF   Bipolar disorder -Continuepsychotropics  Left buttock stage II decubitus ulcer -present on admission -local wound care  Disposition -Patient requires 24-hour assistance versus SNF however she has no help at home.  She is unable to live by herself and when I approached the topic this morning she started crying and does not want to live by herself and she is quite scared.  Without home assistance and realization that home health would not offer adequate care to the level that she requires, she is unsafe for discharge.  Continue to pursue either home with 24-hour supervision if care management can arrange/patient can afford versus SNF  Scheduled Meds: . sodium chloride   Intravenous Once  . amLODipine  5 mg Oral Daily  . bictegravir-emtricitabine-tenofovir AF  1 tablet Oral Daily  . citalopram  10 mg Oral Daily  . dexamethasone  4 mg Oral q morning - 10a  . feeding supplement  1 Container Oral BID BM  . fentaNYL  1 patch Transdermal Q72H  . folic acid  1 mg Oral Daily  . hydrALAZINE  50 mg Oral Q8H  . multivitamin with minerals  1 tablet Oral Daily  . pantoprazole  40 mg Oral BID  . polyethylene glycol  17  g Oral BID  . senna-docusate  2 tablet Oral BID  . sodium chloride flush  3 mL Intravenous Q12H   Continuous Infusions: . sodium chloride 250 mL (04/03/19 0603)   PRN Meds:.sodium chloride,  acetaminophen **OR** acetaminophen, ALPRAZolam, bisacodyl, diphenhydrAMINE, hydrALAZINE, ondansetron **OR** ondansetron (ZOFRAN) IV, oxyCODONE, simethicone, zolpidem  DVT prophylaxis: SCDs Code Status: DNR Family Communication: d/w patient  Disposition Plan: SNF  Consultants:  Oncology  Palliative care  Infectious disease Gastroenterology  Procedures:  None   Microbiology  None   Antimicrobials: None     Objective: Vitals:   04/13/19 1420 04/13/19 2038 04/14/19 0623 04/14/19 1411  BP: (!) 157/94 139/70 (!) 146/80 135/89  Pulse: 64 63 63 63  Resp:  16  16  Temp: 98.7 F (37.1 C) 98.9 F (37.2 C) 98.8 F (37.1 C) 99 F (37.2 C)  TempSrc: Oral Oral Oral Oral  SpO2: 98% 98% 97% 96%  Weight:      Height:        Intake/Output Summary (Last 24 hours) at 04/14/2019 1523 Last data filed at 04/14/2019 1500 Gross per 24 hour  Intake 243 ml  Output 650 ml  Net -407 ml   Filed Weights   03/29/19 2100  Weight: 60 kg    Examination:  Constitutional: No distress Eyes: No scleral icterus ENMT: Moist mucous membranes Respiratory: Clear bilaterally without wheezing or crackles, normal effort Cardiovascular: Regular rate and rhythm, normal limits, no peripheral edema Abdomen: Soft, NT, ND, bowel sounds positive Musculoskeletal: no clubbing / cyanosis.  Skin: No rashes seen Neurologic: Facial droop noted, no new focal deficits  Data Reviewed: I have independently reviewed following labs and imaging studies   CBC: Recent Labs  Lab 04/10/19 0425 04/11/19 0500 04/12/19 0528 04/13/19 0438 04/14/19 0525  WBC 6.5 4.5 5.1 5.2 5.5  NEUTROABS 2.2 1.8 2.6 2.0 2.2  HGB 7.2* 7.4* 7.5* 6.9* 8.6*  HCT 24.1* 24.1* 23.5* 22.4* 27.4*  MCV 86.1 84.0 81.0 84.2 86.4  PLT 63* 61* 65* 63* 62*   Basic Metabolic Panel: Recent Labs  Lab 04/14/19 0525  NA 132*  K 4.4  CL 97*  CO2 25  GLUCOSE 91  BUN 16  CREATININE 0.53  CALCIUM 9.1   Liver Function Tests: Recent Labs  Lab  04/14/19 0525  AST 27  ALT 12  ALKPHOS 90  BILITOT 0.3  PROT 6.1*  ALBUMIN 3.1*   Coagulation Profile: No results for input(s): INR, PROTIME in the last 168 hours. HbA1C: No results for input(s): HGBA1C in the last 72 hours. CBG: No results for input(s): GLUCAP in the last 168 hours.  No results found for this or any previous visit (from the past 240 hour(s)).   Radiology Studies: No results found.  Marzetta Board, MD, PhD [facility]  Between 7 am - 7 pm I am available, please contact me via Amion or Securechat  Between 7 pm - 7 am I am not available, please contact night coverage MD/APP via Amion

## 2019-04-15 LAB — CBC WITH DIFFERENTIAL/PLATELET
Abs Immature Granulocytes: 0.62 10*3/uL — ABNORMAL HIGH (ref 0.00–0.07)
Basophils Absolute: 0.1 10*3/uL (ref 0.0–0.1)
Basophils Relative: 1 %
Eosinophils Absolute: 0 10*3/uL (ref 0.0–0.5)
Eosinophils Relative: 1 %
HCT: 26.6 % — ABNORMAL LOW (ref 36.0–46.0)
Hemoglobin: 8.2 g/dL — ABNORMAL LOW (ref 12.0–15.0)
Immature Granulocytes: 10 %
Lymphocytes Relative: 30 %
Lymphs Abs: 1.9 10*3/uL (ref 0.7–4.0)
MCH: 26.6 pg (ref 26.0–34.0)
MCHC: 30.8 g/dL (ref 30.0–36.0)
MCV: 86.4 fL (ref 80.0–100.0)
Monocytes Absolute: 1.4 10*3/uL — ABNORMAL HIGH (ref 0.1–1.0)
Monocytes Relative: 21 %
Neutro Abs: 2.4 10*3/uL (ref 1.7–7.7)
Neutrophils Relative %: 37 %
Platelets: 65 10*3/uL — ABNORMAL LOW (ref 150–400)
RBC: 3.08 MIL/uL — ABNORMAL LOW (ref 3.87–5.11)
RDW: 16.2 % — ABNORMAL HIGH (ref 11.5–15.5)
WBC: 6.4 10*3/uL (ref 4.0–10.5)
nRBC: 5.5 % — ABNORMAL HIGH (ref 0.0–0.2)

## 2019-04-15 NOTE — Progress Notes (Signed)
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  She was recently on hospice but she recently discontinued.  She has poor insight of her medical problems.  She has complained of intermittent blood in her stool and fecal occult was positive on admission and hemoglobin was 5.3.  Per report and prior notes, during this hospitalization she has frequently hidden her daily pills and not take them as directed.  Subjective / 24h Interval events: Complains of a headache  Assessment & Plan: Principal Problem Acute on chronic blood loss anemia likely due to GI bleed -Hemoglobin was 5.3 on admission, her baseline is around 8.  She was fecal occult positive and gastroenterology was consulted, however given advanced metastatic cancer, thrombocytopenia, she does not seem to be a stable candidate for further work-up due to poor prognosis and recommended monitoring at this point. -Hemoglobin again dropped to 6.9 on 1/8, status post an additional unit of packed red blood cells -Hemoglobin this morning stable  Active Problems Neutropenic fever, HCAP -She was initially placed on broad-spectrum antibiotics, completed 7 days of antibiotics and has remained stable and afebrile since finishing antibiotics on 04/05/2019 -Remains afebrile  Chronic pancytopenia/severe thrombocytopenia -Like related to malignancy as well as underlying HIV.  She is status post a unit of platelets on 03/31/2019, platelets at this point are overall stable -Heme onc consulted, no further intervention recommending hospice at this point -Platelets stable today  HIV -There is a question about compliance  and per report and notes patient has hidden pills during hospitalization 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 -Chronic right-sided facial droop, no longer interested in hospice as she did not like the way she was treated -Continue home Decadron to reduce neurogenic edema -Seen by oncology and no further treatments are to be offered and the cancer is terminal -For now symptomatic management -PT evaluated patient again and recommended home health but with 24-hour supervision versus SNF   Bipolar disorder -Continuepsychotropics  Left buttock stage II decubitus ulcer -present on admission -local wound care  Disposition -Patient requires 24-hour assistance versus SNF however she has no help at home.  She is unable to live by herself and when I approached the topic this morning she started crying and does not want to live by herself and she is quite scared.  Without home assistance and realization that home health would not offer adequate care to the level that she requires, she is unsafe for discharge.  Continue to pursue either home with 24-hour supervision if care management can arrange/patient can afford versus SNF  Scheduled Meds: . sodium chloride   Intravenous Once  . amLODipine  5 mg Oral Daily  . bictegravir-emtricitabine-tenofovir AF  1 tablet Oral Daily  . citalopram  10 mg Oral Daily  . dexamethasone  4 mg Oral q morning - 10a  . feeding supplement  1 Container Oral BID BM  . fentaNYL  1 patch Transdermal Q72H  . folic acid  1 mg Oral Daily  . hydrALAZINE  50 mg Oral Q8H  . multivitamin with minerals  1 tablet Oral Daily  . pantoprazole  40 mg Oral BID  . polyethylene glycol  17 g Oral BID  .  senna-docusate  2 tablet Oral BID  . sodium chloride flush  3 mL Intravenous Q12H   Continuous Infusions: . sodium chloride 250 mL (04/03/19 0603)   PRN Meds:.sodium chloride, acetaminophen **OR** acetaminophen, ALPRAZolam, bisacodyl,  diphenhydrAMINE, hydrALAZINE, ondansetron **OR** ondansetron (ZOFRAN) IV, oxyCODONE, simethicone, zolpidem  DVT prophylaxis: SCDs Code Status: DNR Family Communication: d/w patient  Disposition Plan: SNF  Consultants:  Oncology  Palliative care  Infectious disease Gastroenterology  Procedures:  None   Microbiology  None   Antimicrobials: None     Objective: Vitals:   04/14/19 1411 04/14/19 2120 04/15/19 0558 04/15/19 1250  BP: 135/89 124/74 (!) 153/85 (!) 149/85  Pulse: 63 60 (!) 57 67  Resp: 16 17 17 15   Temp: 99 F (37.2 C) (!) 97.4 F (36.3 C) 98.3 F (36.8 C) 98.8 F (37.1 C)  TempSrc: Oral Oral Oral Oral  SpO2: 96% 98% 96% 97%  Weight:      Height:        Intake/Output Summary (Last 24 hours) at 04/15/2019 1353 Last data filed at 04/15/2019 0809 Gross per 24 hour  Intake 360 ml  Output --  Net 360 ml   Filed Weights   03/29/19 2100  Weight: 60 kg    Examination:  Constitutional: NAD Respiratory: CTA bilaterally  Cardiovascular: RRR Neurologic: Facial droop noted, no new focal deficits  Data Reviewed: I have independently reviewed following labs and imaging studies   CBC: Recent Labs  Lab 04/11/19 0500 04/12/19 0528 04/13/19 0438 04/14/19 0525 04/15/19 0527  WBC 4.5 5.1 5.2 5.5 6.4  NEUTROABS 1.8 2.6 2.0 2.2 2.4  HGB 7.4* 7.5* 6.9* 8.6* 8.2*  HCT 24.1* 23.5* 22.4* 27.4* 26.6*  MCV 84.0 81.0 84.2 86.4 86.4  PLT 61* 65* 63* 62* 65*   Basic Metabolic Panel: Recent Labs  Lab 04/14/19 0525  NA 132*  K 4.4  CL 97*  CO2 25  GLUCOSE 91  BUN 16  CREATININE 0.53  CALCIUM 9.1   Liver Function Tests: Recent Labs  Lab 04/14/19 0525  AST 27  ALT 12  ALKPHOS 90  BILITOT 0.3  PROT 6.1*  ALBUMIN 3.1*   Coagulation Profile: No results for input(s): INR, PROTIME in the last 168 hours. HbA1C: No results for input(s): HGBA1C in the last 72 hours. CBG: No results for input(s): GLUCAP in the last 168 hours.  No results found for  this or any previous visit (from the past 240 hour(s)).   Radiology Studies: No results found.  Marzetta Board, MD, PhD [facility]  Between 7 am - 7 pm I am available, please contact me via Amion or Securechat  Between 7 pm - 7 am I am not available, please contact night coverage MD/APP via Amion

## 2019-04-16 LAB — CBC WITH DIFFERENTIAL/PLATELET
Abs Immature Granulocytes: 0.78 10*3/uL — ABNORMAL HIGH (ref 0.00–0.07)
Basophils Absolute: 0 10*3/uL (ref 0.0–0.1)
Basophils Relative: 0 %
Eosinophils Absolute: 0 10*3/uL (ref 0.0–0.5)
Eosinophils Relative: 0 %
HCT: 30.9 % — ABNORMAL LOW (ref 36.0–46.0)
Hemoglobin: 9.4 g/dL — ABNORMAL LOW (ref 12.0–15.0)
Immature Granulocytes: 11 %
Lymphocytes Relative: 32 %
Lymphs Abs: 2.3 10*3/uL (ref 0.7–4.0)
MCH: 26.4 pg (ref 26.0–34.0)
MCHC: 30.4 g/dL (ref 30.0–36.0)
MCV: 86.8 fL (ref 80.0–100.0)
Monocytes Absolute: 0.9 10*3/uL (ref 0.1–1.0)
Monocytes Relative: 13 %
Neutro Abs: 3.2 10*3/uL (ref 1.7–7.7)
Neutrophils Relative %: 44 %
Platelets: 68 10*3/uL — ABNORMAL LOW (ref 150–400)
RBC: 3.56 MIL/uL — ABNORMAL LOW (ref 3.87–5.11)
RDW: 16.3 % — ABNORMAL HIGH (ref 11.5–15.5)
WBC: 7.2 10*3/uL (ref 4.0–10.5)
nRBC: 4.7 % — ABNORMAL HIGH (ref 0.0–0.2)

## 2019-04-16 NOTE — Progress Notes (Signed)
Physical Therapy Treatment Patient Details Name: Kristen Roman MRN: [id number] DOB: Jan 12, 1961 Today's Date: 04/16/2019    History of Present Illness 59 yo female admitted with anemia, weakness, diplopia, neutropenic fever with possible sepsis. Hx of HIV, L2 compression fx, falls, adenocarcinoma of parotid gland with mets, bipolar d/o, polysubstance abuse    PT Comments    Patient was agreeable to participate in therapy however was resistant to cues for safety and resistant to education on purpose of exercises and mobility. Patient required min guard/supervision for all mobility due to poor safety awareness and unsteadiness. Pt performed sit<>stands for LE strengthening and continues to require use of UE's for power up. She would like to d/c to SNF as she has no assistance at home; pt is mobilizing at level that she could discharge home is 24/7 supervision and assistance is available. Acute PT will continue to progress as able.   Follow Up Recommendations  Home health PT;Supervision/Assistance - 24 hour;SNF     Equipment Recommendations  None recommended by PT    Recommendations for Other Services       Precautions / Restrictions Precautions Precautions: Fall Restrictions Weight Bearing Restrictions: No    Mobility  Bed Mobility Overal bed mobility: Modified Independent             General bed mobility comments: pt sitting EOB eating lunch at start  Transfers Overall transfer level: Needs assistance Equipment used: Rolling walker (2 wheeled) Transfers: Sit to/from [facility] Sit to Stand: Min guard;Supervision Stand pivot transfers: Min guard;Supervision       General transfer comment: cues reqruied for safe hand placement and technique with RW  Ambulation/Gait Ambulation/Gait assistance: Min guard;Supervision Gait Distance (Feet): 320 Feet Assistive device: Rolling walker (2 wheeled) Gait Pattern/deviations: Step-through pattern;Decreased  stride length Gait velocity: decreased   General Gait Details: pt unsteady and weaving laterally with RW during gait, verbal cues required for safe proximity to RW.   Stairs             Wheelchair Mobility    Modified Rankin (Stroke Patients Only)       Balance Overall balance assessment: History of Falls;Needs assistance Sitting-balance support: Feet supported Sitting balance-Leahy Scale: Good     Standing balance support: During functional activity;Bilateral upper extremity supported Standing balance-Leahy Scale: Fair                 Cognition Arousal/Alertness: Awake/alert Behavior During Therapy: WFL for tasks assessed/performed Overall Cognitive Status: Within Functional Limits for tasks assessed               Exercises Other Exercises Other Exercises: Repeat Sit<>Stands: 2x 5 reps, UE use for power up    General Comments        Pertinent Vitals/Pain Pain Assessment: No/denies pain           PT Goals (current goals can now be found in the care plan section) Acute Rehab PT Goals Patient Stated Goal: to go to rehab PT Goal Formulation: With patient Time For Goal Achievement: 04/16/19 Potential to Achieve Goals: Fair Progress towards PT goals: Progressing toward goals    Frequency    Min 2X/week      PT Plan Current plan remains appropriate       AM-PAC PT "6 Clicks" Mobility   Outcome Measure  Help needed turning from your back to your side while in a flat bed without using bedrails?: None Help needed moving from lying on your back to sitting on  the side of a flat bed without using bedrails?: None Help needed moving to and from a bed to a chair (including a wheelchair)?: A Little Help needed standing up from a chair using your arms (e.g., wheelchair or bedside chair)?: A Little Help needed to walk in hospital room?: A Little Help needed climbing 3-5 steps with a railing? : A Little 6 Click Score: 20    End of Session  Equipment Utilized During Treatment: Gait belt Activity Tolerance: Patient tolerated treatment well Patient left: in bed;with call bell/phone within reach;with bed alarm set Nurse Communication: Mobility status PT Visit Diagnosis: Muscle weakness (generalized) (M62.81)     Time: [id number] PT Time Calculation (min) (ACUTE ONLY): 26 min  Charges:  $Gait Training: 8-22 mins $Therapeutic Exercise: 8-22 mins                    Verner Mould, DPT Physical Therapist with [facility] [phone]  04/16/2019 12:46 PM

## 2019-04-16 NOTE — Progress Notes (Signed)
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  She was recently on hospice but she recently discontinued.  She has poor insight of her medical problems.  She has complained of intermittent blood in her stool and fecal occult was positive on admission and hemoglobin was 5.3.  Per report and prior notes, during this hospitalization she has frequently hidden her daily pills and not take them as directed.  Subjective / 24h Interval events: Persistent headache, somewhat improved today  Assessment & Plan: Principal Problem Acute on chronic blood loss anemia likely due to GI bleed -Hemoglobin was 5.3 on admission, her baseline is around 8.  She was fecal occult positive and gastroenterology was consulted, however given advanced metastatic cancer, thrombocytopenia, she does not seem to be a stable candidate for further work-up due to poor prognosis and recommended monitoring at this point. -Hemoglobin again dropped to 6.9 on 1/8, status post an additional unit of packed red blood cells -Hemoglobin remained stable  Active Problems Neutropenic fever, HCAP -She was initially placed on broad-spectrum antibiotics, completed 7 days of antibiotics and has remained stable and afebrile since finishing antibiotics on 04/05/2019 -Afebrile  Chronic pancytopenia/severe thrombocytopenia -Like related to malignancy as well as underlying HIV.  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  She is unable to live by herself and when I approached the topic this morning she started crying and does not want to live by herself and she is quite scared.  Without home assistance and realization that home health would not offer adequate care to the level that she requires, she is unsafe for discharge.  Continue to pursue either home with 24-hour supervision if care management can arrange/patient can afford versus SNF  Scheduled Meds: . amLODipine  5 mg Oral Daily  . bictegravir-emtricitabine-tenofovir AF  1 tablet Oral Daily  . citalopram  10 mg Oral Daily  . dexamethasone  4 mg Oral q morning - 10a  . feeding supplement  1 Container Oral BID BM  . fentaNYL  1 patch Transdermal Q72H  . folic acid  1 mg Oral Daily  . hydrALAZINE  50 mg Oral Q8H  . multivitamin with minerals  1 tablet Oral Daily  . pantoprazole  40 mg Oral BID  . polyethylene glycol  17 g Oral BID  . senna-docusate  2 tablet Oral BID  . sodium  chloride flush  3 mL Intravenous Q12H   Continuous Infusions: . sodium chloride 250 mL (04/03/19 0603)   PRN Meds:.sodium chloride, acetaminophen **OR** acetaminophen, ALPRAZolam, bisacodyl, diphenhydrAMINE, hydrALAZINE,  ondansetron **OR** ondansetron (ZOFRAN) IV, oxyCODONE, simethicone, zolpidem  DVT prophylaxis: SCDs Code Status: DNR Family Communication: d/w patient  Disposition Plan: SNF  Consultants:  Oncology  Palliative care  Infectious disease Gastroenterology  Procedures:  None   Microbiology  None   Antimicrobials: None     Objective: Vitals:   04/15/19 1250 04/15/19 2128 04/16/19 0550 04/16/19 0936  BP: (!) 149/85 133/74 131/86 (!) 149/73  Pulse: 67 65 70 61  Resp: 15 17 18    Temp: 98.8 F (37.1 C) 98.7 F (37.1 C) 97.6 F (36.4 C)   TempSrc: Oral Oral Oral   SpO2: 97% 97% 97%   Weight:      Height:        Intake/Output Summary (Last 24 hours) at 04/16/2019 1252 Last data filed at 04/16/2019 1000 Gross per 24 hour  Intake 120 ml  Output --  Net 120 ml   Filed Weights   03/29/19 2100  Weight: 60 kg    Examination:  Constitutional: NAD Respiratory: CTA Cardiovascular: RRR Neurologic: Facial droop noted, no new focal deficits  Data Reviewed: I have independently reviewed following labs and imaging studies   CBC: Recent Labs  Lab 04/12/19 0528 04/13/19 0438 04/14/19 0525 04/15/19 0527 04/16/19 0449  WBC 5.1 5.2 5.5 6.4 7.2  NEUTROABS 2.6 2.0 2.2 2.4 3.2  HGB 7.5* 6.9* 8.6* 8.2* 9.4*  HCT 23.5* 22.4* 27.4* 26.6* 30.9*  MCV 81.0 84.2 86.4 86.4 86.8  PLT 65* 63* 62* 65* 68*   Basic Metabolic Panel: Recent Labs  Lab 04/14/19 0525  NA 132*  K 4.4  CL 97*  CO2 25  GLUCOSE 91  BUN 16  CREATININE 0.53  CALCIUM 9.1   Liver Function Tests: Recent Labs  Lab 04/14/19 0525  AST 27  ALT 12  ALKPHOS 90  BILITOT 0.3  PROT 6.1*  ALBUMIN 3.1*   Coagulation Profile: No results for input(s): INR, PROTIME in the last 168 hours. HbA1C: No results for input(s): HGBA1C in the last 72 hours. CBG: No results for input(s): GLUCAP in the last 168 hours.  No results found for this or any previous visit (from the past 240 hour(s)).   Radiology  Studies: No results found.  Marzetta Board, MD, PhD [facility]  Between 7 am - 7 pm I am available, please contact me via Amion or Securechat  Between 7 pm - 7 am I am not available, please contact night coverage MD/APP via Amion

## 2019-04-17 LAB — BASIC METABOLIC PANEL
Anion gap: 12 (ref 5–15)
BUN: 19 mg/dL (ref 6–20)
CO2: 25 mmol/L (ref 22–32)
Calcium: 9.3 mg/dL (ref 8.9–10.3)
Chloride: 96 mmol/L — ABNORMAL LOW (ref 98–111)
Creatinine, Ser: 0.52 mg/dL (ref 0.44–1.00)
GFR calc Af Amer: >60 mL/min (ref >60)
GFR calc non Af Amer: >60 mL/min (ref >60)
Glucose, Bld: 78 mg/dL (ref 70–99)
Potassium: 4.5 mmol/L (ref 3.5–5.1)
Sodium: 133 mmol/L — ABNORMAL LOW (ref 135–145)

## 2019-04-17 LAB — CBC WITH DIFFERENTIAL/PLATELET
Abs Immature Granulocytes: 0.67 10*3/uL — ABNORMAL HIGH (ref 0.00–0.07)
Basophils Absolute: 0.1 10*3/uL (ref 0.0–0.1)
Basophils Relative: 1 %
Eosinophils Absolute: 0 10*3/uL (ref 0.0–0.5)
Eosinophils Relative: 0 %
HCT: 27.7 % — ABNORMAL LOW (ref 36.0–46.0)
Hemoglobin: 8.4 g/dL — ABNORMAL LOW (ref 12.0–15.0)
Immature Granulocytes: 11 %
Lymphocytes Relative: 32 %
Lymphs Abs: 1.9 10*3/uL (ref 0.7–4.0)
MCH: 26.6 pg (ref 26.0–34.0)
MCHC: 30.3 g/dL (ref 30.0–36.0)
MCV: 87.7 fL (ref 80.0–100.0)
Monocytes Absolute: 1.1 10*3/uL — ABNORMAL HIGH (ref 0.1–1.0)
Monocytes Relative: 18 %
Neutro Abs: 2.4 10*3/uL (ref 1.7–7.7)
Neutrophils Relative %: 38 %
Platelets: 57 10*3/uL — ABNORMAL LOW (ref 150–400)
RBC: 3.16 MIL/uL — ABNORMAL LOW (ref 3.87–5.11)
RDW: 16.5 % — ABNORMAL HIGH (ref 11.5–15.5)
WBC: 6.1 10*3/uL (ref 4.0–10.5)
nRBC: 5.1 % — ABNORMAL HIGH (ref 0.0–0.2)

## 2019-04-17 NOTE — TOC Progression Note (Signed)
Transition of Care [facility]) - Progression Note    Patient Details  Name: Kristen Roman MRN: [id number] Date of Birth: February 01, 1961  Transition of [facility] CM/SW Contact  Latessa Tillis, Marjie Skiff, RN Phone Number: 04/17/2019, 3:55 PM  Clinical Narrative:     TOC continues to attempt to locate a LTC bed for pt. When this CM speaks with pt at bedside about calling her niece for help she becomes tearful and states that "she has her own family". This CM spoke with pt about using a Life Alert system. Pt becomes upset and states that she can't go home by herself.  This CM faxed in Expedited Personal Care Form through Medicaid.   Expected Discharge Plan: Skilled Nursing Facility Barriers to Discharge: [place] (PASRR), No SNF bed(No SNF bed offers, PASRR level II, [place] Must Closed in observance of the Holiday.)  Expected Discharge Plan and Services Expected Discharge Plan: [place] In-house Referral: Clinical Social Work   Post Acute Care Choice: [place] Living arrangements for the past 2 months: Single Family Home                                       Social Determinants of Health (SDOH) Interventions    Readmission Risk Interventions No flowsheet data found.

## 2019-04-17 NOTE — Progress Notes (Signed)
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  She was recently on hospice but she recently discontinued.  She has poor insight of her medical problems.  She has complained of intermittent blood in her stool and fecal occult was positive on admission and hemoglobin was 5.3.  Per report and prior notes, during this hospitalization she has frequently hidden her daily pills and not take them as directed.  Subjective / 24h Interval events: Headache better. No other complaints  Assessment & Plan: Principal Problem Acute on chronic blood loss anemia likely due to GI bleed -Hemoglobin was 5.3 on admission, her baseline is around 8.  She was fecal occult positive and gastroenterology was consulted, however given advanced metastatic cancer, thrombocytopenia, she does not seem to be a stable candidate for further work-up due to poor prognosis and recommended monitoring at this point. -Hemoglobin again dropped to 6.9 on 1/8, status post an additional unit of packed red blood cells -Hemoglobin remained stable since  Active Problems Neutropenic fever, HCAP -She was initially placed on broad-spectrum antibiotics, completed 7 days of antibiotics and has remained stable and afebrile since finishing antibiotics on 04/05/2019 -Afebrile  Chronic pancytopenia/severe thrombocytopenia -Like related to malignancy as well as underlying HIV.  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  She is unable to live by herself and when I approached the topic she started crying and does not want to live by herself and she is quite scared.  Without home assistance and realization that home health would not offer adequate care to the level that she requires, she is unsafe for discharge.  Continue to pursue either home with 24-hour supervision if care management can arrange/patient can afford versus SNF  Scheduled Meds: . amLODipine  5 mg Oral Daily  . bictegravir-emtricitabine-tenofovir AF  1 tablet Oral Daily  . citalopram  10 mg Oral Daily  . dexamethasone  4 mg Oral q morning - 10a  . feeding supplement  1 Container Oral BID BM  . fentaNYL  1 patch Transdermal Q72H  . folic acid  1 mg Oral Daily  . hydrALAZINE  50 mg Oral Q8H  . multivitamin with minerals  1 tablet Oral Daily  . pantoprazole  40 mg Oral BID  . polyethylene glycol  17 g Oral BID  . senna-docusate  2 tablet Oral BID  . sodium chloride  flush  3 mL Intravenous Q12H   Continuous Infusions: . sodium chloride 250 mL (04/03/19 0603)   PRN Meds:.sodium chloride, acetaminophen **OR** acetaminophen, ALPRAZolam, bisacodyl, diphenhydrAMINE, hydrALAZINE, ondansetron **OR**  ondansetron (ZOFRAN) IV, oxyCODONE, simethicone, zolpidem  DVT prophylaxis: SCDs Code Status: DNR Family Communication: d/w patient  Disposition Plan: SNF  Consultants:  Oncology  Palliative care  Infectious disease Gastroenterology  Procedures:  None   Microbiology  None   Antimicrobials: None     Objective: Vitals:   04/16/19 0936 04/16/19 1505 04/16/19 2013 04/17/19 0453  BP: (!) 149/73 118/82 (!) 120/100 (!) 150/71  Pulse: 61 65 67 64  Resp:  17 17 17   Temp:  99.2 F (37.3 C) 98.1 F (36.7 C) 98.7 F (37.1 C)  TempSrc:  Oral Oral Oral  SpO2:  97% 98% 97%  Weight:      Height:        Intake/Output Summary (Last 24 hours) at 04/17/2019 1233 Last data filed at 04/17/2019 0616 Gross per 24 hour  Intake 835 ml  Output --  Net 835 ml   Filed Weights   03/29/19 2100  Weight: 60 kg    Examination:  Constitutional: NAD Respiratory: CTA Cardiovascular: RRR Neurologic: Facial droop noted, no new focal deficits  Data Reviewed: I have independently reviewed following labs and imaging studies   CBC: Recent Labs  Lab 04/13/19 0438 04/14/19 0525 04/15/19 0527 04/16/19 0449 04/17/19 0543  WBC 5.2 5.5 6.4 7.2 6.1  NEUTROABS 2.0 2.2 2.4 3.2 2.4  HGB 6.9* 8.6* 8.2* 9.4* 8.4*  HCT 22.4* 27.4* 26.6* 30.9* 27.7*  MCV 84.2 86.4 86.4 86.8 87.7  PLT 63* 62* 65* 68* 57*   Basic Metabolic Panel: Recent Labs  Lab 04/14/19 0525 04/17/19 0543  NA 132* 133*  K 4.4 4.5  CL 97* 96*  CO2 25 25  GLUCOSE 91 78  BUN 16 19  CREATININE 0.53 0.52  CALCIUM 9.1 9.3   Liver Function Tests: Recent Labs  Lab 04/14/19 0525  AST 27  ALT 12  ALKPHOS 90  BILITOT 0.3  PROT 6.1*  ALBUMIN 3.1*   Coagulation Profile: No results for input(s): INR, PROTIME in the last 168 hours. HbA1C: No results for input(s): HGBA1C in the last 72 hours. CBG: No results for input(s): GLUCAP in the last 168 hours.  No results found for this or any previous visit (from the past 240  hour(s)).   Radiology Studies: No results found.  Marzetta Board, MD, PhD [facility]  Between 7 am - 7 pm I am available, please contact me via Amion or Securechat  Between 7 pm - 7 am I am not available, please contact night coverage MD/APP via Amion

## 2019-04-18 DIAGNOSIS — Z515 Encounter for palliative care: Secondary | ICD-10-CM

## 2019-04-18 LAB — CBC WITH DIFFERENTIAL/PLATELET
Abs Immature Granulocytes: 0.49 10*3/uL — ABNORMAL HIGH (ref 0.00–0.07)
Basophils Absolute: 0 10*3/uL (ref 0.0–0.1)
Basophils Relative: 1 %
Eosinophils Absolute: 0 10*3/uL (ref 0.0–0.5)
Eosinophils Relative: 0 %
HCT: 26.4 % — ABNORMAL LOW (ref 36.0–46.0)
Hemoglobin: 8.1 g/dL — ABNORMAL LOW (ref 12.0–15.0)
Immature Granulocytes: 9 %
Lymphocytes Relative: 31 %
Lymphs Abs: 1.7 10*3/uL (ref 0.7–4.0)
MCH: 26.5 pg (ref 26.0–34.0)
MCHC: 30.7 g/dL (ref 30.0–36.0)
MCV: 86.3 fL (ref 80.0–100.0)
Monocytes Absolute: 0.9 10*3/uL (ref 0.1–1.0)
Monocytes Relative: 16 %
Neutro Abs: 2.5 10*3/uL (ref 1.7–7.7)
Neutrophils Relative %: 43 %
Platelets: 56 10*3/uL — ABNORMAL LOW (ref 150–400)
RBC: 3.06 MIL/uL — ABNORMAL LOW (ref 3.87–5.11)
RDW: 16.5 % — ABNORMAL HIGH (ref 11.5–15.5)
WBC: 5.6 10*3/uL (ref 4.0–10.5)
nRBC: 5.9 % — ABNORMAL HIGH (ref 0.0–0.2)

## 2019-04-18 MED ORDER — BOOST / RESOURCE BREEZE PO LIQD CUSTOM: 1.0000 | ORAL | Status: DC

## 2019-04-18 NOTE — Progress Notes (Signed)
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  Patient was living at home by herself, had hospice at home care that she discontinued recently. She has complained of intermittent blood in her stool and fecal occult was positive on admission and hemoglobin was 5.3.    Subjective / 24h Interval events: Patient denies any complaints.  Seen when she was working with PT, she was unsteady, needed support to walk. "I cannot go home like this, I need to go to some places like nursing home" today she denies any issues.  Assessment & Plan:  Acute on chronic blood loss anemia likely due to GI bleed Hemoglobin 5.3 on admission.  Likely chronic GI bleed.  Seen by gastroenterology, recommended against endoscopic evaluation due to terminal stage of metastatic cancer. She has received total 3 units of PRBC, hemoglobin appropriately responded to 9 and now dropping down to 8.  Close monitoring.  Neutropenic fever, HCAP -She was initially placed on broad-spectrum antibiotics, completed 7 days of antibiotics and has remained stable and afebrile since finishing antibiotics on 04/05/2019. -Afebrile  Chronic pancytopenia/severe thrombocytopenia -Like related to malignancy as well as underlying HIV.  Received 1 unit of platelets on 12/26.   -Heme onc consulted, no further intervention recommending hospice at this point -Platelets stable  HIV -Unknown compliance. 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 -Chronic right-sided facial droop, no longer interested in hospice as she did not like the way she was treated -Continue home  Decadron to reduce neurogenic edema -Seen by oncology and no further treatments are to be offered and the cancer is terminal -For now symptomatic management -PT evaluated patient again and recommended home health but with 24-hour supervision versus SNF   Bipolar disorder -Continuepsychotropics  Left buttock stage II decubitus ulcer -present on admission -local wound care  Disposition: Patient was seen working with physical therapy, she has poor mobility and very high risk of fall and at this situation will have difficulty with independent living. She will need long-term placement/nursing home versus assisted living facilities.  Patient is willing to enroll into hospice once she is able to go to 1 of these places as well and wants to spend her days there.    Scheduled Meds: . amLODipine  5 mg Oral Daily  . bictegravir-emtricitabine-tenofovir AF  1 tablet Oral Daily  . citalopram  10 mg Oral Daily  . dexamethasone  4 mg Oral q morning - 10a  . feeding supplement  1 Container Oral Q24H  . fentaNYL  1 patch Transdermal Q72H  . folic acid  1 mg Oral Daily  . hydrALAZINE  50 mg Oral Q8H  . multivitamin with minerals  1 tablet Oral Daily  . pantoprazole  40 mg Oral BID  . polyethylene glycol  17 g Oral BID  . senna-docusate  2 tablet Oral BID  . sodium chloride flush  3 mL Intravenous Q12H   Continuous Infusions: . sodium chloride 250 mL (04/03/19 0603)   PRN Meds:.sodium chloride, acetaminophen **OR** acetaminophen, ALPRAZolam, bisacodyl, diphenhydrAMINE, hydrALAZINE, ondansetron **OR** ondansetron (ZOFRAN) IV, oxyCODONE, simethicone, zolpidem  DVT prophylaxis: SCDs Code Status: DNR Family Communication: Offered patient to call her family, patient states that she will  talk to them herself. Disposition Plan: SNF vs ALF.  Consultants:  Oncology  Palliative care  Infectious disease Gastroenterology  Procedures:  None   Microbiology  None   Antimicrobials: None      Objective: Vitals:   04/17/19 0453 04/17/19 1511 04/17/19 2012 04/18/19 1334  BP: (!) 150/71 135/87 (!) 147/73 129/75  Pulse: 64  61 62  Resp: 17  16 17   Temp: 98.7 F (37.1 C)  98.4 F (36.9 C) 97.9 F (36.6 C)  TempSrc: Oral  Oral Oral  SpO2: 97%  99% 96%  Weight:      Height:        Intake/Output Summary (Last 24 hours) at 04/18/2019 1519 Last data filed at 04/18/2019 0843 Gross per 24 hour  Intake 240 ml  Output --  Net 240 ml   Filed Weights   03/29/19 2100  Weight: 60 kg    Examination:  Constitutional: NAD, on room air.  Chronically sick looking.  Not in any distress. Respiratory: CTA Cardiovascular: RRR Neurologic: Facial droop noted, no new focal deficits  Data Reviewed: I have independently reviewed following labs and imaging studies   CBC: Recent Labs  Lab 04/14/19 0525 04/15/19 0527 04/16/19 0449 04/17/19 0543 04/18/19 0526  WBC 5.5 6.4 7.2 6.1 5.6  NEUTROABS 2.2 2.4 3.2 2.4 2.5  HGB 8.6* 8.2* 9.4* 8.4* 8.1*  HCT 27.4* 26.6* 30.9* 27.7* 26.4*  MCV 86.4 86.4 86.8 87.7 86.3  PLT 62* 65* 68* 57* 56*   Basic Metabolic Panel: Recent Labs  Lab 04/14/19 0525 04/17/19 0543  NA 132* 133*  K 4.4 4.5  CL 97* 96*  CO2 25 25  GLUCOSE 91 78  BUN 16 19  CREATININE 0.53 0.52  CALCIUM 9.1 9.3   Liver Function Tests: Recent Labs  Lab 04/14/19 0525  AST 27  ALT 12  ALKPHOS 90  BILITOT 0.3  PROT 6.1*  ALBUMIN 3.1*   Coagulation Profile: No results for input(s): INR, PROTIME in the last 168 hours. HbA1C: No results for input(s): HGBA1C in the last 72 hours. CBG: No results for input(s): GLUCAP in the last 168 hours.  No results found for this or any previous visit (from the past 240 hour(s)).   Radiology Studies: No results found.

## 2019-04-18 NOTE — Progress Notes (Signed)
Nutrition Follow-up  RD working remotely.   DOCUMENTATION CODES:   Not applicable  INTERVENTION:  - continue Boost Breeze but will decrease from BID to once/day. - continue Magic Cup BID. - continue to encourage PO intakes.    NUTRITION DIAGNOSIS:   Inadequate oral intake related to poor appetite as evidenced by per patient/family report, meal completion < 50%. -improving  GOAL:   Patient will meet greater than or equal to 90% of their needs -minimally met on average   MONITOR:   PO intake, Supplement acceptance, Labs, Weight trends, Skin  ASSESSMENT:   59 y.o. 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 Of note, patient recently discontinued hospice, has poor insight to medical problems and a poor historian. Patient is a reluctant historian with unclear insight to her condition. Pt also reports intermittent blood in her stool over the last week, unable to give further info.  She has been consuming Boost Breeze ~25% of the time over the past 1 week. Per flow sheet documentation, patient most recently consumed the following at meals:  1/9- 50% of breakfast and lunch, 0% of dinner 1/10- 0% of breakfast 1/11- 75% of dinner 1/12- 100% of breakfast and lunch  Patient has felt hungrier than usual over the past 2-3 days. She has not had any issues with abdominal pain/pressure or nausea during or after PO intakes.   Per notes: - Hgb dropped on 1/8 and given 1 unit PRBC with stable Hgb since - chronic pancytopenia/severe thrombocytopenia--thought to be 2/2 malignancy and HIV - stage 4 parotid adencocarcinoma with mets to brain/meninges--no further treatment options - PT recommends SNF vs [facility] [place] with 24-hour supervision--pending final decisions concerning d/c plan    Labs reviewed; Na: 133  mmol/l, Cl: 96 mmol/l. Medications reviewed; 1 mg folvite/day, daily multivitamin with minerals, 40 mg oral protonix BID, 1 packet miralax BID, 2 tablets senokot BID.      Diet Order:   Diet Order            Diet regular Room service appropriate? Yes; Fluid consistency: Thin  Diet effective now              EDUCATION NEEDS:   No education needs have been identified at this time  Skin:  Skin Assessment: Skin Integrity Issues: Skin Integrity Issues:: Stage II Stage II: coccyx  Last BM:  1/12  Height:   Ht Readings from Last 1 Encounters:  03/29/19 5' 4.96" (1.65 m)    Weight:   Wt Readings from Last 1 Encounters:  03/29/19 60 kg    Ideal Body Weight:  56.8 kg  BMI:  Body mass index is 22.04 kg/m.  Estimated Nutritional Needs:   Kcal:  1700-1900 kcal  Protein:  80-90 grams  Fluid:  >/= 1.8 L/day     Jarome Matin, MS, RD, LDN, [facility] Inpatient Clinical Dietitian Pager # [phone] After hours/weekend pager # [phone]

## 2019-04-18 NOTE — Progress Notes (Signed)
Physical Therapy Treatment Patient Details Name: Kristen Roman MRN: [id number] DOB: March 03, 1961 Today's Date: 04/18/2019    History of Present Illness 58 yo female admitted with anemia, weakness, diplopia, neutropenic fever with possible sepsis. Hx of HIV, L2 compression fx, falls, adenocarcinoma of parotid gland with mets, bipolar d/o, polysubstance abuse    PT Comments    Pt able to ambulate 400' with RW and min guard (pt would not let PT progress her to supervision).  Pt with fear of falling - reports 3 falls in one day.  Very fearful of returning home alone.  Pt is progressing past level of needing SNF level of care for rehab but not safe to be alone.  Consider 24 hr care at home vs NH/ALF long term placement.       Follow Up Recommendations  Home health PT;Supervision/Assistance - 24 hour(Pt progressing past SNF level of care but not safe without supervision; may benefit from long term placement NH or ALF)     Equipment Recommendations  None recommended by PT    Recommendations for Other Services       Precautions / Restrictions Precautions Precautions: Fall    Mobility  Bed Mobility Overal bed mobility: Modified Independent                Transfers Overall transfer level: Needs assistance Equipment used: Rolling walker (2 wheeled) Transfers: Sit to/from Stand Sit to Stand: Min guard;Min assist         General transfer comment: pt with safe hand placement without cues; min guard from bed; min A from lower toilet seat for standing; independent with toileting adls  Ambulation/Gait Ambulation/Gait assistance: Min guard Gait Distance (Feet): 400 Feet Assistive device: Rolling walker (2 wheeled) Gait Pattern/deviations: Step-through pattern;Decreased stride length Gait velocity: decreased   General Gait Details: verbal cues to stay close to RW; no LOB;  tried to progress to supervision but pt refused and wanted PT to continue holding gait belt   Stairs              Wheelchair Mobility    Modified Rankin (Stroke Patients Only)       Balance Overall balance assessment: Needs assistance Sitting-balance support: Feet supported Sitting balance-Leahy Scale: Normal     Standing balance support: During functional activity;Bilateral upper extremity supported Standing balance-Leahy Scale: Fair                              Cognition Arousal/Alertness: Awake/alert Behavior During Therapy: WFL for tasks assessed/performed Overall Cognitive Status: Within Functional Limits for tasks assessed                                 General Comments: Pt reports will not go home alone due to fear of falling.      Exercises      General Comments General comments (skin integrity, edema, etc.): VSS: pt had some c/o dizziness after walking; BP in sitting 140/70 and in standing 132/72; HR and O2 stable.  Declined further exercise due to dizziness.  Stated "I just want to sit here and be quite"      Pertinent Vitals/Pain Pain Assessment: No/denies pain    Home Living                      Prior Function  PT Goals (current goals can now be found in the care plan section) Progress towards PT goals: Progressing toward goals    Frequency    Min 2X/week      PT Plan Current plan remains appropriate    Co-evaluation              AM-PAC PT "6 Clicks" Mobility   Outcome Measure  Help needed turning from your back to your side while in a flat bed without using bedrails?: None Help needed moving from lying on your back to sitting on the side of a flat bed without using bedrails?: None Help needed moving to and from a bed to a chair (including a wheelchair)?: None Help needed standing up from a chair using your arms (e.g., wheelchair or bedside chair)?: None Help needed to walk in hospital room?: None Help needed climbing 3-5 steps with a railing? : A Little 6 Click Score: 23    End  of Session Equipment Utilized During Treatment: Gait belt Activity Tolerance: Patient tolerated treatment well Patient left: in bed;with call bell/phone within reach Nurse Communication: Mobility status PT Visit Diagnosis: Muscle weakness (generalized) (M62.81)     Time: 1030-1058 PT Time Calculation (min) (ACUTE ONLY): 28 min  Charges:  $Gait Training: 8-22 mins $Therapeutic Activity: 8-22 mins                     [name], PT Acute Rehab Services Pager [phone] [facility] [phone] [facility] [place] 04/18/2019, 11:18 AM

## 2019-04-19 NOTE — TOC Progression Note (Signed)
Transition of Care [facility]) - Progression Note    Patient Details  Name: Kristen Roman MRN: [id number] Date of Birth: 22-Oct-1960  Transition of [facility] CM/SW Contact  Dorrance Sellick, Marjie Skiff, RN Phone Number: 04/19/2019, 3:16 PM  Clinical Narrative:    TOC continues to attempt to find pt LTC bed.  Pt was faxed out to [facility] as pt may be at assistant living level walking 400 feet min assist:  [place].    Expected Discharge Plan: Skilled Nursing Facility Barriers to Discharge: [place] (PASRR), No SNF bed(No SNF bed offers, PASRR level II, [place] Must Closed in observance of the Holiday.)  Expected Discharge Plan and Services Expected Discharge Plan: [place] In-house Referral: Clinical Social Work   Post Acute Care Choice: [place] Living arrangements for the past 2 months: Single Family Home                                       Social Determinants of Health (SDOH) Interventions    Readmission Risk Interventions No flowsheet data found.

## 2019-04-19 NOTE — Evaluation (Signed)
Occupational Therapy Evaluation Patient Details Name: Kristen Roman MRN: [id number] DOB: [birth year] Today's Date: 04/19/2019    History of Present Illness 59 yo female admitted with anemia, weakness, diplopia, neutropenic fever with possible sepsis. Hx of HIV, L2 compression fx, falls, adenocarcinoma of parotid gland with mets, bipolar d/o, polysubstance abuse   Clinical Impression   Pt admitted with the above diagnoses and presents with below problem list. Pt will benefit from continued acute OT to address the below listed deficits and maximize independence with basic ADLs prior to d/c to venue below. Pt is from home where she lives alone. She is mod I with ADLs, utilizing a walker at baseline. Pt is currently min guard to min A with LB ADLs and functional transfers/mobility. Pt reporting dizziness while walking with bp assessed post-ambulation at 148/94. Of note, pt needing cues to take standing rest breaks for safety while walking as she was observed to have worsening dizziness with eyes frequently closed. Pt up in recliner at end of session appearing fatigued.      Follow Up Recommendations  Supervision/Assistance - 24 hour;SNF;Other (comment)(vs ALF)    Equipment Recommendations  3 in 1 bedside commode    Recommendations for Other Services       Precautions / Restrictions Precautions Precautions: Fall Restrictions Weight Bearing Restrictions: No      Mobility Bed Mobility Overal bed mobility: Modified Independent                Transfers Overall transfer level: Needs assistance Equipment used: Rolling walker (2 wheeled) Transfers: Sit to/from Stand Sit to Stand: Min guard;Min assist         General transfer comment: assist for steadying to control descent with pt appearing to experience increased dizziness with household distance ambulation. otherwise min guard for safety    Balance Overall balance assessment: Needs assistance Sitting-balance support: Feet  supported Sitting balance-Leahy Scale: Normal     Standing balance support: During functional activity;Bilateral upper extremity supported Standing balance-Leahy Scale: Fair                             ADL either performed or assessed with clinical judgement   ADL Overall ADL's : Needs assistance/impaired Eating/Feeding: Set up;Sitting   Grooming: Set up;Sitting Grooming Details (indicate cue type and reason): extra time Upper Body Bathing: Sitting;Set up;Supervision/ safety   Lower Body Bathing: Minimal assistance;Sit to/from stand;Min guard   Upper Body Dressing : Set up;Supervision/safety;Sitting   Lower Body Dressing: Minimal assistance;Sit to/from stand   Toilet Transfer: Min guard;Ambulation   Toileting- Clothing Manipulation and Hygiene: Min guard;Minimal assistance;Sit to/from stand   Tub/ Shower Transfer: Min guard;Ambulation;3 in 1;Rolling walker   Functional mobility during ADLs: Min guard;Rolling walker General ADL Comments: Pt requesting to walk in the hall with OT. Noted to keep eyes mostly closed while walking, observable signs of dizziness (increased speed of eye movements) but pt needing mod cues to take a standing rest break and adjust activity level to toleration.      Vision         Perception     Praxis      Pertinent Vitals/Pain Pain Assessment: Faces Faces Pain Scale: Hurts a little bit Pain Location: "my butt is sore" Pain Descriptors / Indicators: Sore Pain Intervention(s): Monitored during session     Hand Dominance Right   Extremity/Trunk Assessment Upper Extremity Assessment Upper Extremity Assessment: Overall WFL for tasks assessed;Generalized weakness   Lower  Extremity Assessment Lower Extremity Assessment: Defer to PT evaluation   Cervical / Trunk Assessment Cervical / Trunk Assessment: Normal   Communication Communication Communication: No difficulties   Cognition Arousal/Alertness: Awake/alert Behavior  During Therapy: WFL for tasks assessed/performed Overall Cognitive Status: No family/caregiver present to determine baseline cognitive functioning                                 General Comments: verbalizes fear of falling but needing cues to reduce activity level to match increased dizziness with walking. Short, low volume responses. Appears internally distracted. Reports she likes a quiet environment.   General Comments  dizziness with OOB/mobility. Vitals assessed with bp 148/94 post-ambulation. Pt resting in recliner at end of session, appearing to doze off at times    Exercises     Shoulder Instructions      Home Living Family/patient expects to be discharged to:: Unsure Living Arrangements: Alone Available Help at Discharge: Friend(s);Available PRN/intermittently Type of Home: Apartment Home Access: Level entry     Home Layout: One level     Bathroom Shower/Tub: Teacher, early years/pre: Standard     Home Equipment: Cane - single point;Walker - 4 wheels          Prior Functioning/Environment Level of Independence: Independent with assistive device(s)        Comments: uses RW        OT Problem List: Decreased activity tolerance;Impaired balance (sitting and/or standing);Decreased strength;Decreased knowledge of use of DME or AE;Decreased knowledge of precautions;Cardiopulmonary status limiting activity      OT Treatment/Interventions: Self-care/ADL training;DME and/or AE instruction;Therapeutic activities;Patient/family education;Balance training;Therapeutic exercise    OT Goals(Current goals can be found in the care plan section) Acute Rehab OT Goals Patient Stated Goal: not be home alone, fear of falling OT Goal Formulation: With patient Time For Goal Achievement: 05/03/19 Potential to Achieve Goals: Good ADL Goals Pt Will Perform Lower Body Bathing: with supervision;sit to/from stand Pt Will Perform Lower Body Dressing: with  supervision;sit to/from stand Pt Will Transfer to Toilet: with supervision;ambulating Pt Will Perform Toileting - Clothing Manipulation and hygiene: with supervision;sit to/from stand Pt Will Perform Tub/Shower Transfer: ambulating;with supervision  OT Frequency: Min 2X/week   Barriers to D/C: Decreased caregiver support  from home alone       Co-evaluation              AM-PAC OT "6 Clicks" Daily Activity     Outcome Measure Help from another person eating meals?: None Help from another person taking care of personal grooming?: A Little Help from another person toileting, which includes using toliet, bedpan, or urinal?: A Little Help from another person bathing (including washing, rinsing, drying)?: A Little Help from another person to put on and taking off regular upper body clothing?: A Little Help from another person to put on and taking off regular lower body clothing?: A Little 6 Click Score: 19   End of Session Equipment Utilized During Treatment: Gait belt;Rolling walker  Activity Tolerance: Patient limited by fatigue;Other (comment)(dizziness, bp 148/94 post ambualtion) Patient left: in chair;with call bell/phone within reach  OT Visit Diagnosis: Unsteadiness on feet (R26.81);Muscle weakness (generalized) (M62.81)                Time: [id number] OT Time Calculation (min): 20 min Charges:  OT General Charges $OT Visit: 1 Visit OT Evaluation $OT Eval Low Complexity: 1 Low  Tyrone Schimke,  OT Acute Rehabilitation Services Pager: [phone] Office: [phone]   Hortencia Pilar 04/19/2019, 12:13 PM

## 2019-04-19 NOTE — Progress Notes (Signed)
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  Patient was living at home by herself, had hospice at home care that she discontinued recently. She has complained of intermittent blood in her stool and fecal occult was positive on admission and hemoglobin was 5.3.    Subjective / 24h Interval events: No overnight events.  "No way I can go home like this "  Assessment & Plan:  Acute on chronic blood loss anemia likely due to GI bleed Hemoglobin 5.3 on admission.  Likely chronic GI bleed.  Seen by gastroenterology, recommended against endoscopic evaluation due to terminal stage of metastatic cancer. She has received total 3 units of PRBC, hemoglobin appropriately responded to 9 and now dropping down to 8.  Close monitoring.  Neutropenic fever, HCAP -She was initially placed on broad-spectrum antibiotics, completed 7 days of antibiotics and has remained stable and afebrile since finishing antibiotics on 04/05/2019. -Afebrile  Chronic pancytopenia/severe thrombocytopenia -Like related to malignancy as well as underlying HIV.  Received 1 unit of platelets on 12/26.   -Heme onc consulted, no further intervention recommending hospice at this point -Platelets stable  HIV -Unknown compliance. 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 -Chronic right-sided facial droop, no longer interested in hospice as she did not like the way she was treated -Continue home Decadron to reduce neurogenic edema -Seen by oncology and no further treatments are to be offered and the cancer is terminal -For now symptomatic management -PT  evaluated patient again and recommended home health but with 24-hour supervision versus SNF   Bipolar disorder -Continuepsychotropics  Left buttock stage II decubitus ulcer -present on admission -local wound care  Disposition: SNF versus ALF.  Involvement of hospice after finding placement.   Scheduled Meds: . amLODipine  5 mg Oral Daily  . bictegravir-emtricitabine-tenofovir AF  1 tablet Oral Daily  . citalopram  10 mg Oral Daily  . dexamethasone  4 mg Oral q morning - 10a  . feeding supplement  1 Container Oral Q24H  . fentaNYL  1 patch Transdermal Q72H  . folic acid  1 mg Oral Daily  . hydrALAZINE  50 mg Oral Q8H  . multivitamin with minerals  1 tablet Oral Daily  . pantoprazole  40 mg Oral BID  . polyethylene glycol  17 g Oral BID  . senna-docusate  2 tablet Oral BID  . sodium chloride flush  3 mL Intravenous Q12H   Continuous Infusions: . sodium chloride 250 mL (04/03/19 0603)   PRN Meds:.sodium chloride, acetaminophen **OR** acetaminophen, ALPRAZolam, bisacodyl, diphenhydrAMINE, hydrALAZINE, ondansetron **OR** ondansetron (ZOFRAN) IV, oxyCODONE, simethicone, zolpidem  DVT prophylaxis: SCDs Code Status: DNR Family Communication: Patient asked me not to call her family members.  She states she will call them. Disposition Plan: SNF vs ALF.  Consultants:  Oncology  Palliative care  Infectious disease Gastroenterology  Procedures:  None   Microbiology  None   Antimicrobials: None     Objective: Vitals:   04/18/19 1334 04/18/19 2002 04/19/19 0517 04/19/19 0900  BP: 129/75 120/74 125/70 (!) 148/94  Pulse: 62 61 68 65  Resp: 17 16 16    Temp: 97.9 F (36.6 C) 98.2 F (36.8 C) 98.7 F (37.1 C)  TempSrc: Oral Oral Oral   SpO2: 96% 98% 97% 99%  Weight:      Height:        Intake/Output Summary (Last 24 hours) at 04/19/2019 1221 Last data filed at 04/19/2019 0117 Gross per 24 hour  Intake 480 ml  Output 400 ml  Net 80 ml   Filed Weights    03/29/19 2100  Weight: 60 kg    Examination:  Constitutional: NAD, on room air.  Chronically sick looking.  Not in any distress. Respiratory: CTA Cardiovascular: RRR Neurologic: Facial deformity and droop noted, no new focal deficits, generalized weakness.  Data Reviewed: I have independently reviewed following labs and imaging studies   CBC: Recent Labs  Lab 04/14/19 0525 04/15/19 0527 04/16/19 0449 04/17/19 0543 04/18/19 0526  WBC 5.5 6.4 7.2 6.1 5.6  NEUTROABS 2.2 2.4 3.2 2.4 2.5  HGB 8.6* 8.2* 9.4* 8.4* 8.1*  HCT 27.4* 26.6* 30.9* 27.7* 26.4*  MCV 86.4 86.4 86.8 87.7 86.3  PLT 62* 65* 68* 57* 56*   Basic Metabolic Panel: Recent Labs  Lab 04/14/19 0525 04/17/19 0543  NA 132* 133*  K 4.4 4.5  CL 97* 96*  CO2 25 25  GLUCOSE 91 78  BUN 16 19  CREATININE 0.53 0.52  CALCIUM 9.1 9.3   Liver Function Tests: Recent Labs  Lab 04/14/19 0525  AST 27  ALT 12  ALKPHOS 90  BILITOT 0.3  PROT 6.1*  ALBUMIN 3.1*   Coagulation Profile: No results for input(s): INR, PROTIME in the last 168 hours. HbA1C: No results for input(s): HGBA1C in the last 72 hours. CBG: No results for input(s): GLUCAP in the last 168 hours.  No results found for this or any previous visit (from the past 240 hour(s)).   Radiology Studies: No results found.

## 2019-04-20 MED ORDER — MORPHINE SULFATE (PF) 2 MG/ML IV SOLN
2.0000 mg | Freq: Once | INTRAVENOUS | Status: AC
  Administered 2019-04-20: 2 mg via INTRAVENOUS
  Filled 2019-04-20: qty 1

## 2019-04-20 MED ORDER — OXYCODONE HCL 5 MG PO TABS
5.0000 mg | ORAL_TABLET | ORAL | Status: DC | PRN
  Administered 2019-04-20 – 2019-04-25 (×17): 5 mg via ORAL
  Filled 2019-04-20 (×17): qty 1

## 2019-04-20 NOTE — TOC Progression Note (Addendum)
Transition of Care [facility]) - Progression Note    Patient Details  Name: REBECA RATKOWSKI MRN: [id number] Date of Birth: [birth year]  Transition of Care [facility]) CM/SW [place], [place] Phone Number: 04/20/2019, 10:47 AM  Clinical Narrative:   Spoke with Hayley [admissions] at [facility] who states they would be glad to take patient, but are concerned because notes say she wants to live in [place].  Called patient to talk about plan.  She states her family lives in [place], "and they would not be able to wash my clothes and visit me."  I explained that there is no need for them to wash her clothes and that there will be no visitation at any facility for months due to [place].  She remained unconvinced.  I told her we would continue the search for a couple more days in [facility], but if that netted nothing, we would have to send her where we have a bed offer.  She verbalized understanding. TOC will continue to follow during the course of hospitalization.  Addendum:  Called [facility] in [place].  They have no MCD beds. Freda Munro at [facility] is reviewing.     Expected Discharge Plan: Skilled Nursing Facility Barriers to Discharge: [place] (PASRR), No SNF bed(No SNF bed offers, PASRR level II, [place] Must Closed in observance of the Holiday.)  Expected Discharge Plan and Services Expected Discharge Plan: [place] In-house Referral: Clinical Social Work   Post Acute Care Choice: [place] Living arrangements for the past 2 months: Single Family Home                                       Social Determinants of Health (SDOH) Interventions    Readmission Risk Interventions No flowsheet data found.

## 2019-04-20 NOTE — Progress Notes (Signed)
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  Patient was living at home by herself, had hospice at home care that she discontinued recently. She has complained of intermittent blood in her stool and fecal occult was positive on admission and hemoglobin was 5.3.    Subjective / 24h Interval events: No overnight events.  She had some left hip pain.  Assessment & Plan:  Acute on chronic blood loss anemia likely due to GI bleed Hemoglobin 5.3 on admission.  Likely chronic GI bleed.  Seen by gastroenterology, recommended against endoscopic evaluation due to terminal stage of metastatic cancer. She has received total 3 units of PRBC, hemoglobin appropriately responded to 9 and now dropping down to 8.  Close monitoring.  Neutropenic fever, HCAP -She was initially placed on broad-spectrum antibiotics, completed 7 days of antibiotics and has remained stable and afebrile since finishing antibiotics on 04/05/2019. -Afebrile  Chronic pancytopenia/severe thrombocytopenia: -Like related to malignancy as well as underlying HIV.  Received 1 unit of platelets on 12/26.   -Heme onc consulted, no further intervention recommending hospice at this point -Platelets stable  HIV -Unknown compliance. 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 -Chronic right-sided facial droop, no longer interested in hospice as she did not like the way she was treated -Continue home Decadron to reduce neurogenic edema -Seen by oncology and no further treatments are to be offered and the cancer is terminal -For now symptomatic management.  Multiple  pain regimen.  Decreased oxycodone. -PT evaluated patient again and recommended home health but with 24-hour supervision versus SNF   Bipolar disorder -Continuepsychotropics  Left buttock stage II decubitus ulcer -present on admission -local wound care  Disposition: Patient not able to go home due to significant balance issues.  She lives alone. Patient has been waiting in the hospital to find a skilled nursing facility bed.  No local bed offers. Today, patient has a bed offer from [facility] and she is declining to go there. Detailed discussion with patient, she wants [place] to give her some time to see if she can go to [facility] area as patient understands that she may live there until she die and she wants to die in the facility near her family at [facility]. Patient was updated that we will wait for 1 to 2 days and if no local facility available, she will be discharged to any facility available. Patient agreeable to involve hospice in the facility.   Scheduled Meds: . amLODipine  5 mg Oral Daily  . bictegravir-emtricitabine-tenofovir AF  1 tablet Oral Daily  . citalopram  10 mg Oral Daily  . dexamethasone  4 mg Oral q morning - 10a  . feeding supplement  1 Container Oral Q24H  . fentaNYL  1 patch Transdermal Q72H  . folic acid  1 mg Oral Daily  . hydrALAZINE  50 mg Oral Q8H  . multivitamin with minerals  1 tablet Oral Daily  . pantoprazole  40 mg Oral BID  . polyethylene glycol  17 g Oral BID  . senna-docusate  2 tablet Oral BID  . sodium chloride flush  3 mL Intravenous Q12H   Continuous Infusions: . sodium chloride 250 mL (  04/03/19 0603)   PRN Meds:.sodium chloride, acetaminophen **OR** acetaminophen, ALPRAZolam, bisacodyl, diphenhydrAMINE, hydrALAZINE, ondansetron **OR** ondansetron (ZOFRAN) IV, oxyCODONE, simethicone, zolpidem  DVT prophylaxis: SCDs Code Status: DNR Family Communication: Patient asked me not to call her family. Disposition Plan: SNF when  available.  Consultants:  Oncology  Palliative care  Infectious disease Gastroenterology  Procedures:  None   Microbiology  None   Antimicrobials: None     Objective: Vitals:   04/19/19 1301 04/19/19 2153 04/20/19 0539 04/20/19 1414  BP: (!) 155/84 137/90 (!) 147/90 (!) 155/90  Pulse: 62 61 66 64  Resp: 16 16 16 16   Temp: 98.3 F (36.8 C) 98.6 F (37 C) 98.8 F (37.1 C) 98.5 F (36.9 C)  TempSrc: Oral Oral Oral Oral  SpO2: 99% 93% 97% 97%  Weight:      Height:        Intake/Output Summary (Last 24 hours) at 04/20/2019 1600 Last data filed at 04/20/2019 0915 Gross per 24 hour  Intake 240 ml  Output --  Net 240 ml   Filed Weights   03/29/19 2100  Weight: 60 kg    Examination:  Constitutional: NAD, on room air.  Chronically sick looking.  Not in any distress. Respiratory: CTA Cardiovascular: RRR Neurologic: Facial deformity and droop noted, no new focal deficits, generalized weakness.  Data Reviewed: I have independently reviewed following labs and imaging studies   CBC: Recent Labs  Lab 04/14/19 0525 04/15/19 0527 04/16/19 0449 04/17/19 0543 04/18/19 0526  WBC 5.5 6.4 7.2 6.1 5.6  NEUTROABS 2.2 2.4 3.2 2.4 2.5  HGB 8.6* 8.2* 9.4* 8.4* 8.1*  HCT 27.4* 26.6* 30.9* 27.7* 26.4*  MCV 86.4 86.4 86.8 87.7 86.3  PLT 62* 65* 68* 57* 56*   Basic Metabolic Panel: Recent Labs  Lab 04/14/19 0525 04/17/19 0543  NA 132* 133*  K 4.4 4.5  CL 97* 96*  CO2 25 25  GLUCOSE 91 78  BUN 16 19  CREATININE 0.53 0.52  CALCIUM 9.1 9.3   Liver Function Tests: Recent Labs  Lab 04/14/19 0525  AST 27  ALT 12  ALKPHOS 90  BILITOT 0.3  PROT 6.1*  ALBUMIN 3.1*   Coagulation Profile: No results for input(s): INR, PROTIME in the last 168 hours. HbA1C: No results for input(s): HGBA1C in the last 72 hours. CBG: No results for input(s): GLUCAP in the last 168 hours.  No results found for this or any previous visit (from the past 240 hour(s)).   Radiology  Studies: No results found.

## 2019-04-21 LAB — CBC
HCT: 26.5 % — ABNORMAL LOW (ref 36.0–46.0)
Hemoglobin: 8.6 g/dL — ABNORMAL LOW (ref 12.0–15.0)
MCH: 27.1 pg (ref 26.0–34.0)
MCHC: 32.5 g/dL (ref 30.0–36.0)
MCV: 83.6 fL (ref 80.0–100.0)
Platelets: 51 10*3/uL — ABNORMAL LOW (ref 150–400)
RBC: 3.17 MIL/uL — ABNORMAL LOW (ref 3.87–5.11)
RDW: 16.3 % — ABNORMAL HIGH (ref 11.5–15.5)
WBC: 6.2 10*3/uL (ref 4.0–10.5)
nRBC: 3.4 % — ABNORMAL HIGH (ref 0.0–0.2)

## 2019-04-21 MED ORDER — MORPHINE SULFATE (PF) 2 MG/ML IV SOLN
2.0000 mg | INTRAVENOUS | Status: DC | PRN
  Administered 2019-04-21 – 2019-04-25 (×28): 2 mg via INTRAVENOUS
  Filled 2019-04-21 (×28): qty 1

## 2019-04-21 NOTE — Progress Notes (Signed)
Physical Therapy Treatment Patient Details Name: Kristen Roman MRN: [id number] DOB: 1961/02/08 Today's Date: 04/21/2019    History of Present Illness 59 yo female admitted with anemia, weakness, diplopia, neutropenic fever with possible sepsis. Hx of HIV, L2 compression fx, falls, adenocarcinoma of parotid gland with mets, bipolar d/o, polysubstance abuse    PT Comments    Pt agreeable to walk, although sleepy, but needed to use BSC first. After transfers she c/o dizziness and returned supine and was asleep almost immediately after returning to the bed.  Recommend SNF.   Follow Up Recommendations  SNF;Supervision/Assistance - 24 hour     Equipment Recommendations  None recommended by PT    Recommendations for Other Services       Precautions / Restrictions Precautions Precautions: Fall Restrictions Weight Bearing Restrictions: No    Mobility  Bed Mobility Overal bed mobility: Needs Assistance Bed Mobility: Supine to Sit;Sit to Supine     Supine to sit: Supervision Sit to supine: Supervision   General bed mobility comments: Supervision for safety  Transfers Overall transfer level: Needs assistance   Transfers: Stand Pivot Transfers   Stand pivot transfers: Min guard       General transfer comment: min/guard for steadying due to sleepiness  Ambulation/Gait             General Gait Details: deferred due to fatigue and dizziness   Stairs             Wheelchair Mobility    Modified Rankin (Stroke Patients Only)       Balance   Sitting-balance support: Feet supported Sitting balance-Leahy Scale: Good     Standing balance support: During functional activity Standing balance-Leahy Scale: Fair                              Cognition Arousal/Alertness: Lethargic;Suspect due to medications Behavior During Therapy: Flat affect Overall Cognitive Status: No family/caregiver present to determine baseline cognitive functioning                                  General Comments: Pt quiet during session      Exercises      General Comments        Pertinent Vitals/Pain Pain Intervention(s): Premedicated before session    Home Living                      Prior Function            PT Goals (current goals can now be found in the care plan section) Acute Rehab PT Goals Potential to Achieve Goals: Fair Progress towards PT goals: Not progressing toward goals - comment(Pt fatigued today)    Frequency    Min 2X/week      PT Plan Current plan remains appropriate    Co-evaluation              AM-PAC PT "6 Clicks" Mobility   Outcome Measure  Help needed turning from your back to your side while in a flat bed without using bedrails?: None Help needed moving from lying on your back to sitting on the side of a flat bed without using bedrails?: None Help needed moving to and from a bed to a chair (including a wheelchair)?: A Little Help needed standing up from a chair using your arms (e.g., wheelchair or bedside chair)?:  None Help needed to walk in hospital room?: A Little Help needed climbing 3-5 steps with a railing? : A Little 6 Click Score: 21    End of Session   Activity Tolerance: Patient limited by fatigue Patient left: in bed;with call bell/phone within reach;with bed alarm set Nurse Communication: Mobility status PT Visit Diagnosis: Muscle weakness (generalized) (M62.81)     Time: [id number] PT Time Calculation (min) (ACUTE ONLY): 8 min  Charges:  $Therapeutic Activity: 8-22 mins                     Kyngston Pickelsimer L. Tamala Julian, [place] Pager [phone] 04/21/2019    Galen Manila 04/21/2019, 12:41 PM

## 2019-04-21 NOTE — Progress Notes (Signed)
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  Patient was living at home by herself, had hospice at home care that she discontinued recently. She has complained of intermittent blood in her stool and fecal occult was positive on admission and hemoglobin was 5.3.    04/21/19: Seen and examined.  Reports hurting all over with a headache.  Hospice care prior to admission.  PMT consulted to establish goals of care and to assist with disposition.  Assessment/Plan: Active Problems:   Symptomatic anemia   Anemia   Pressure injury of skin   Pancytopenia (HCC)   Heme positive stool   Generalized weakness   Palliative care by specialist   Goals of care, counseling/discussion  Acute on chronic blood loss anemia likely due to GI bleed Hemoglobin 5.3 on admission.  Likely chronic GI bleed.  Seen by gastroenterology, recommended against endoscopic evaluation due to terminal stage of metastatic cancer. She has received total 3 units of PRBC, hemoglobin appropriately responded to 9 and now dropping down to 8.  Close monitoring. Will repeat H&H again today  Neutropenic fever, HCAP -She was initially placed on broad-spectrum antibiotics, completed 7 days of antibiotics and has remained stable and afebrile since finishing antibiotics on 04/05/2019. -Afebrile Repeat cbc  Chronic pancytopenia/severe thrombocytopenia: -Like related to malignancy as well as underlying HIV.  Received 1 unit of platelets on 12/26.   -Heme onc consulted, no further intervention recommending hospice at this point -Platelets stable  HIV -Unknown compliance. -continue Biktarvy  Stage IV parotid  adenocarcinoma with mets to brain/meninges -Chronic right-sided facial droop, no longer interested in hospice as she did not like the way she was treated -Continue home Decadron to reduce neurogenic edema -Seen by oncology and no further treatments are to be offered and the cancer is terminal -For now symptomatic management.  Multiple pain regimen.  Decreased oxycodone. -PT evaluated patient again and recommended home health but with 24-hour supervision versus SNF   Bipolar disorder -Continuepsychotropics  Left buttock stage II decubitus ulcer -present on admission -local wound care  Disposition: Patient not able to go home due to significant balance issues.  She lives alone. Patient has been waiting in the hospital to find a skilled nursing facility bed.  No local bed offers. Patient has a bed offer from [facility] and she is declining to go there. Detailed discussion with patient, she wants [place] to give her some time to see if she can go to [facility] area as patient understands that she may live there until she die and she wants to die in the facility near her family at [facility]. Patient was updated that we will wait for 1 to 2 days and if no local facility available, she will be discharged to any facility available. Patient agreeable to involve hospice in the facility.  Goals of care Palliative care consulted to assist with management and disposition Previously in hospice care in her home   DVT prophylaxis: SCDs Code Status: DNR Family Communication: Patient asked previous provider not to call her family. Disposition Plan: SNF when available.  Consultants:  Oncology  Palliative care  Infectious disease Gastroenterology  Procedures:  None   Microbiology  None   Antimicrobials: None  Objective: Vitals:   04/20/19 0539 04/20/19 1414 04/20/19 2241 04/21/19 0559  BP: (!) 147/90 (!) 155/90 (!) 147/90 (!) 176/85  Pulse: 66 64 71 69  Resp: 16 16 17  17   Temp: 98.8 F (37.1 C) 98.5 F (36.9 C) 98.7 F (37.1 C) 99 F (37.2 C)  TempSrc: Oral Oral Oral Oral  SpO2: 97% 97% 98% 99%  Weight:      Height:        Intake/Output Summary (Last 24 hours) at 04/21/2019 1006 Last data filed at 04/20/2019 1858 Gross per 24 hour  Intake 400 ml  Output --  Net 400 ml   Filed Weights   03/29/19 2100  Weight: 60 kg    Exam:  . General: 59 y.o. year-old female Frail appearing uncomfortable due to hurting all over . Cardiovascular: Regular rate and rhythm with no rubs or gallops.   Marland Kitchen Respiratory: Clear to auscultation with no wheezes or rales. Good inspiratory effort. . Abdomen: Soft nontender nondistended with normal bowel sounds x4 quadrants. . Psychiatry: Mood is appropriate for condition and setting   Data Reviewed: CBC: Recent Labs  Lab 04/15/19 0527 04/16/19 0449 04/17/19 0543 04/18/19 0526  WBC 6.4 7.2 6.1 5.6  NEUTROABS 2.4 3.2 2.4 2.5  HGB 8.2* 9.4* 8.4* 8.1*  HCT 26.6* 30.9* 27.7* 26.4*  MCV 86.4 86.8 87.7 86.3  PLT 65* 68* 57* 56*   Basic Metabolic Panel: Recent Labs  Lab 04/17/19 0543  NA 133*  K 4.5  CL 96*  CO2 25  GLUCOSE 78  BUN 19  CREATININE 0.52  CALCIUM 9.3   GFR: Estimated Creatinine Clearance: 68.9 mL/min (by C-G formula based on SCr of 0.52 mg/dL). Liver Function Tests: No results for input(s): AST, ALT, ALKPHOS, BILITOT, PROT, ALBUMIN in the last 168 hours. No results for input(s): LIPASE, AMYLASE in the last 168 hours. No results for input(s): AMMONIA in the last 168 hours. Coagulation Profile: No results for input(s): INR, PROTIME in the last 168 hours. Cardiac Enzymes: No results for input(s): CKTOTAL, CKMB, CKMBINDEX, TROPONINI in the last 168 hours. BNP (last 3 results) No results for input(s): PROBNP in the last 8760 hours. HbA1C: No results for input(s): HGBA1C in the last 72 hours. CBG: No results for input(s): GLUCAP in the last 168 hours. Lipid Profile: No results for  input(s): CHOL, HDL, LDLCALC, TRIG, CHOLHDL, LDLDIRECT in the last 72 hours. Thyroid Function Tests: No results for input(s): TSH, T4TOTAL, FREET4, T3FREE, THYROIDAB in the last 72 hours. Anemia Panel: No results for input(s): VITAMINB12, FOLATE, FERRITIN, TIBC, IRON, RETICCTPCT in the last 72 hours. Urine analysis:    Component Value Date/Time   COLORURINE YELLOW 03/30/2019 0600   APPEARANCEUR CLEAR 03/30/2019 0600   LABSPEC 1.013 03/30/2019 0600   PHURINE 6.0 03/30/2019 0600   GLUCOSEU NEGATIVE 03/30/2019 0600   GLUCOSEU NEG mg/dL 12/29/2006 2001   HGBUR NEGATIVE 03/30/2019 0600   BILIRUBINUR NEGATIVE 03/30/2019 0600   KETONESUR NEGATIVE 03/30/2019 0600   PROTEINUR NEGATIVE 03/30/2019 0600   UROBILINOGEN 1 12/29/2006 2001   NITRITE NEGATIVE 03/30/2019 0600   LEUKOCYTESUR NEGATIVE 03/30/2019 0600   Sepsis Labs: @LABRCNTIP (procalcitonin:4,lacticidven:4)  )No results found for this or any previous visit (from the past 240 hour(s)).    Studies: No results found.  Scheduled Meds: . amLODipine  5 mg Oral Daily  . bictegravir-emtricitabine-tenofovir AF  1 tablet Oral Daily  . citalopram  10 mg Oral Daily  . dexamethasone  4 mg Oral q morning - 10a  .  feeding supplement  1 Container Oral Q24H  . fentaNYL  1 patch Transdermal Q72H  . folic acid  1 mg Oral Daily  . hydrALAZINE  50 mg Oral Q8H  . multivitamin with minerals  1 tablet Oral Daily  . pantoprazole  40 mg Oral BID  . polyethylene glycol  17 g Oral BID  . senna-docusate  2 tablet Oral BID  . sodium chloride flush  3 mL Intravenous Q12H    Continuous Infusions: . sodium chloride 250 mL (04/03/19 0603)     LOS: 23 days     Kayleen Memos, MD [facility] Pager [phone]  If 7PM-7AM, please contact night-coverage [email or web address] Password [email or web address] 04/21/2019, 10:06 AM

## 2019-04-22 NOTE — Progress Notes (Signed)
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  Patient was living at home by herself, had hospice at home care that she discontinued recently. She has complained of intermittent blood in her stool and fecal occult was positive on admission and hemoglobin was 5.3.    Hemoglobin stable 8.6.  04/22/19: Seen and examined.  No acute events overnight.  Awaiting SNF placement.  Assessment/Plan: Active Problems:   Symptomatic anemia   Anemia   Pressure injury of skin   Pancytopenia (HCC)   Heme positive stool   Generalized weakness   Palliative care by specialist   Goals of care, counseling/discussion  Acute on chronic blood loss anemia likely due to GI bleed Hemoglobin 5.3 on admission.  Likely chronic GI bleed.  Seen by gastroenterology, recommended against endoscopic evaluation due to terminal stage of metastatic cancer. She has received total 3 units of PRBC, hemoglobin appropriately responded to 9 and now dropping down to 8.  Close monitoring. Hemoglobin stable 8.6 from 8.1.  Neutropenic fever, HCAP -She was initially placed on broad-spectrum antibiotics, completed 7 days of antibiotics and has remained stable and afebrile since finishing antibiotics on 04/05/2019. -Afebrile  Chronic pancytopenia/severe thrombocytopenia: -Like related to malignancy as well as underlying HIV.  Received 1 unit of platelets on 12/26.   -Heme onc consulted, no further intervention recommending hospice at this point -Platelets stable 51K.  HIV -Unknown compliance. 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 -Chronic right-sided facial droop, no longer  interested in hospice as she did not like the way she was treated -Continue home Decadron to reduce neurogenic edema -Seen by oncology and no further treatments are to be offered and the cancer is terminal -For now symptomatic management.  Multiple pain regimen.  Decreased oxycodone. -PT evaluated patient again and recommended home health but with 24-hour supervision versus SNF   Bipolar disorder -Continuepsychotropics  Left buttock stage II decubitus ulcer -present on admission -local wound care  Disposition: Patient not able to go home due to significant balance issues.  She lives alone. Patient has been waiting in the hospital to find a skilled nursing facility bed.  No local bed offers. Patient has a bed offer from [facility] and she is declining to go there. Detailed discussion with patient, she wants [place] to give her some time to see if she can go to [facility] area as patient understands that she may live there until she die and she wants to die in the facility near her family at [facility]. Patient was updated that we will wait for 1 to 2 days and if no local facility available, she will be discharged to any facility available. Patient agreeable to involve hospice in the facility.  Goals of care Palliative care consulted to assist with management and disposition Previously in hospice care in her home   DVT prophylaxis: SCDs Code Status: DNR Family Communication:  Asked patient if she would like me to call her children she declined and stated that she will call them. Disposition Plan: SNF when available.  Consultants:  Oncology  Palliative care  Infectious disease Gastroenterology  Procedures:  None   Microbiology  None   Antimicrobials: None     Objective: Vitals:  04/21/19 1452 04/21/19 2039 04/22/19 0644 04/22/19 1458  BP: (!) 142/77 139/78 (!) 154/91 (!) 152/81  Pulse: 72 68 71 63  Resp: 18 16 16 16   Temp: 98.7 F (37.1 C) 98.1 F (36.7  C) 99.5 F (37.5 C) 98.7 F (37.1 C)  TempSrc: Oral Oral Oral Oral  SpO2: 99% 96% 98% 100%  Weight:      Height:        Intake/Output Summary (Last 24 hours) at 04/22/2019 1501 Last data filed at 04/22/2019 1400 Gross per 24 hour  Intake 1409.17 ml  Output 800 ml  Net 609.17 ml   Filed Weights   03/29/19 2100  Weight: 60 kg    Exam:  . General: 59 y.o. year-old female frail-appearing in no acute distress.  Cardiovascular: Regular rate and rhythm no rubs or gallops.   Marland Kitchen Respiratory: Clear to auscultation no wheezes no rales.   . Abdomen: Normal bowel sounds present.   Marland Kitchen Psych: Mood is appropriate.   Data Reviewed: CBC: Recent Labs  Lab 04/16/19 0449 04/17/19 0543 04/18/19 0526 04/21/19 1120  WBC 7.2 6.1 5.6 6.2  NEUTROABS 3.2 2.4 2.5  --   HGB 9.4* 8.4* 8.1* 8.6*  HCT 30.9* 27.7* 26.4* 26.5*  MCV 86.8 87.7 86.3 83.6  PLT 68* 57* 56* 51*   Basic Metabolic Panel: Recent Labs  Lab 04/17/19 0543  NA 133*  K 4.5  CL 96*  CO2 25  GLUCOSE 78  BUN 19  CREATININE 0.52  CALCIUM 9.3   GFR: Estimated Creatinine Clearance: 68.9 mL/min (by C-G formula based on SCr of 0.52 mg/dL). Liver Function Tests: No results for input(s): AST, ALT, ALKPHOS, BILITOT, PROT, ALBUMIN in the last 168 hours. No results for input(s): LIPASE, AMYLASE in the last 168 hours. No results for input(s): AMMONIA in the last 168 hours. Coagulation Profile: No results for input(s): INR, PROTIME in the last 168 hours. Cardiac Enzymes: No results for input(s): CKTOTAL, CKMB, CKMBINDEX, TROPONINI in the last 168 hours. BNP (last 3 results) No results for input(s): PROBNP in the last 8760 hours. HbA1C: No results for input(s): HGBA1C in the last 72 hours. CBG: No results for input(s): GLUCAP in the last 168 hours. Lipid Profile: No results for input(s): CHOL, HDL, LDLCALC, TRIG, CHOLHDL, LDLDIRECT in the last 72 hours. Thyroid Function Tests: No results for input(s): TSH, T4TOTAL, FREET4,  T3FREE, THYROIDAB in the last 72 hours. Anemia Panel: No results for input(s): VITAMINB12, FOLATE, FERRITIN, TIBC, IRON, RETICCTPCT in the last 72 hours. Urine analysis:    Component Value Date/Time   COLORURINE YELLOW 03/30/2019 0600   APPEARANCEUR CLEAR 03/30/2019 0600   LABSPEC 1.013 03/30/2019 0600   PHURINE 6.0 03/30/2019 0600   GLUCOSEU NEGATIVE 03/30/2019 0600   GLUCOSEU NEG mg/dL 12/29/2006 2001   HGBUR NEGATIVE 03/30/2019 0600   BILIRUBINUR NEGATIVE 03/30/2019 0600   KETONESUR NEGATIVE 03/30/2019 0600   PROTEINUR NEGATIVE 03/30/2019 0600   UROBILINOGEN 1 12/29/2006 2001   NITRITE NEGATIVE 03/30/2019 0600   LEUKOCYTESUR NEGATIVE 03/30/2019 0600   Sepsis Labs: @LABRCNTIP (procalcitonin:4,lacticidven:4)  )No results found for this or any previous visit (from the past 240 hour(s)).    Studies: No results found.  Scheduled Meds: . amLODipine  5 mg Oral Daily  . bictegravir-emtricitabine-tenofovir AF  1 tablet Oral Daily  . citalopram  10 mg Oral Daily  . dexamethasone  4 mg Oral q morning - 10a  . feeding supplement  1 Container Oral Q24H  . fentaNYL  1 patch Transdermal Q72H  .  folic acid  1 mg Oral Daily  . hydrALAZINE  50 mg Oral Q8H  . multivitamin with minerals  1 tablet Oral Daily  . pantoprazole  40 mg Oral BID  . polyethylene glycol  17 g Oral BID  . senna-docusate  2 tablet Oral BID  . sodium chloride flush  3 mL Intravenous Q12H    Continuous Infusions: . sodium chloride 250 mL (04/03/19 0603)     LOS: 24 days     Kayleen Memos, MD [facility] Pager [phone]  If 7PM-7AM, please contact night-coverage [email or web address] Password [facility] 04/22/2019, 3:01 PM

## 2019-04-23 MED ORDER — NYSTATIN 100000 UNIT/ML MT SUSP
5.0000 mL | Freq: Four times a day (QID) | OROMUCOSAL | Status: DC
  Administered 2019-04-23 – 2019-04-25 (×7): 500000 [IU] via ORAL
  Filled 2019-04-23 (×7): qty 5

## 2019-04-23 MED ORDER — MAGIC MOUTHWASH W/LIDOCAINE
5.0000 mL | Freq: Three times a day (TID) | ORAL | Status: DC
  Administered 2019-04-23 – 2019-04-25 (×5): 5 mL via ORAL
  Filled 2019-04-23 (×9): qty 5

## 2019-04-23 NOTE — TOC Progression Note (Signed)
Transition of Care [facility]) - Progression Note    Patient Details  Name: TEALE YOCK MRN: [id number] Date of Birth: January 08, 1961  Transition of Care [facility]) CM/SW [place], [place] Phone Number: 04/23/2019, 1:53 PM  Clinical Narrative:    CSW reached out to [facility], spoke to [place] in admission. She reports the patient information is still under review.  [facility] does not have LTC bed available.  CSW updated the patient and explain the barriers to finding placement in [facility]. Patient reports, "I am not feeling well to today, can we talk about this tomorrow."  [facility] staff will follow up with the patient in the am once a final decision has been confirm at [facility]   Expected Discharge Plan: Skilled Nursing Facility Barriers to Discharge: [place] (PASRR), No SNF bed(No SNF bed offers, PASRR level II,  Must Closed in observance of the Holiday.)  Expected Discharge Plan and Services Expected Discharge Plan: [place] In-house Referral: Clinical Social Work   Post Acute Care Choice: [place] Living arrangements for the past 2 months: Single Family Home                                       Social Determinants of Health (SDOH) Interventions    Readmission Risk Interventions No flowsheet data found.

## 2019-04-23 NOTE — Plan of Care (Signed)
  Problem: Bowel/Gastric: Goal: Will show no signs and symptoms of gastrointestinal bleeding Outcome: Progressing   Problem: Education: Goal: Knowledge of General Education information will improve Description: Including pain rating scale, medication(s)/side effects and non-pharmacologic comfort measures Outcome: Progressing   Problem: Health Behavior/Discharge Planning: Goal: Ability to manage health-related needs will improve Outcome: Progressing   Problem: Clinical Measurements: Goal: Ability to maintain clinical measurements within normal limits will improve Outcome: Progressing Goal: Will remain free from infection Outcome: Progressing

## 2019-04-23 NOTE — Progress Notes (Addendum)
Patient c/o problems & pain swallowing, crushed her meds in applesauce. I mentioned it to MD, in which they  ordered some medications for.

## 2019-04-23 NOTE — Progress Notes (Signed)
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  Patient was living at home by herself, had hospice at home care that she discontinued recently. She has complained of intermittent blood in her stool and fecal occult was positive on admission and hemoglobin was 5.3.    Hemoglobin stable 8.6.  04/23/19: Seen and examined.  No acute events overnight.  Awaiting SNF placement.  Started oral nystatin/Magic mouthwash with lidocaine for odynophagia.  Assessment/Plan: Active Problems:   Symptomatic anemia   Anemia   Pressure injury of skin   Pancytopenia (HCC)   Heme positive stool   Generalized weakness   Palliative care by specialist   Goals of care, counseling/discussion  Acute on chronic blood loss anemia likely due to GI bleed Hemoglobin 5.3 on admission.  Likely chronic GI bleed.  Seen by gastroenterology, recommended against endoscopic evaluation due to terminal stage of metastatic cancer. She has received total 3 units of PRBC, hemoglobin appropriately responded to 9 and now dropping down to 8.  Close monitoring. Hemoglobin stable 8.6 from 8.1.  Odynophagia likely related to HIV/ immunosuppression Started oral nystatin/Magic mouthwash with lidocaine for odynophagia.  Neutropenic fever, HCAP -She was initially placed on broad-spectrum antibiotics, completed 7 days of antibiotics and has remained stable and afebrile since finishing antibiotics on 04/05/2019. -Afebrile  Chronic pancytopenia/severe thrombocytopenia: -Like related to malignancy as well as underlying HIV.  Received 1 unit of platelets on 12/26.   -Heme onc consulted, no further intervention recommending hospice  at this point -Platelets stable 51K.  HIV -Unknown compliance. 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 -Chronic right-sided facial droop, no longer interested in hospice as she did not like the way she was treated -Continue home Decadron to reduce neurogenic edema -Seen by oncology and no further treatments are to be offered and the cancer is terminal -For now symptomatic management.  Multiple pain regimen.  Decreased oxycodone. -PT evaluated patient again and recommended home health but with 24-hour supervision versus SNF   Bipolar disorder -Continuepsychotropics  Left buttock stage II decubitus ulcer -present on admission -local wound care  Disposition: Patient not able to go home due to significant balance issues.  She lives alone. Patient has been waiting in the hospital to find a skilled nursing facility bed.  No local bed offers. Patient has a bed offer from [facility] and she is declining to go there. Detailed discussion with patient, she wants [place] to give her some time to see if she can go to [facility] area as patient understands that she may live there until she die and she wants to die in the facility near her family at [facility]. Patient was updated that we will wait for 1 to 2 days and if no local facility available, she will be discharged to any facility available. Patient agreeable to involve hospice in the facility.  Goals of care Palliative care consulted to assist with management and disposition Previously in hospice care in her home   DVT prophylaxis: SCDs Code Status: DNR Family Communication:  Asked patient if she would like me to call her children she declined and stated that she will call them. Disposition Plan: SNF when bed is available.  Consultants:  Oncology  Palliative care  Infectious disease Gastroenterology  Procedures:  None   Microbiology  None   Antimicrobials: None      Objective: Vitals:   04/22/19 1953 04/23/19 0623 04/23/19 1022 04/23/19 1410  BP: 134/82 (!) 138/99 136/79 (!) 141/80  Pulse: 68 73 68 67  Resp: 16 18  15   Temp: 98.9 F (37.2 C) 98.9 F (37.2 C)  99 F (37.2 C)  TempSrc: Oral Oral  Oral  SpO2: 95% 98% 99% 97%  Weight:      Height:        Intake/Output Summary (Last 24 hours) at 04/23/2019 1458 Last data filed at 04/23/2019 1000 Gross per 24 hour  Intake --  Output 400 ml  Net -400 ml   Filed Weights   03/29/19 2100  Weight: 60 kg    Exam:  . General: 59 y.o. year-old female frail-appearing no acute distress.  Alert and interactive.  .  Cardiovascular: Regular rate and rhythm no rubs or gallops.   Marland Kitchen Respiratory: Clear to Auscultation No Wheezes No Rales.   . Abdomen: Normal bowel sounds present. Marland Kitchen Psych: Mood is appropriate for condition and setting.   Data Reviewed: CBC: Recent Labs  Lab 04/17/19 0543 04/18/19 0526 04/21/19 1120  WBC 6.1 5.6 6.2  NEUTROABS 2.4 2.5  --   HGB 8.4* 8.1* 8.6*  HCT 27.7* 26.4* 26.5*  MCV 87.7 86.3 83.6  PLT 57* 56* 51*   Basic Metabolic Panel: Recent Labs  Lab 04/17/19 0543  NA 133*  K 4.5  CL 96*  CO2 25  GLUCOSE 78  BUN 19  CREATININE 0.52  CALCIUM 9.3   GFR: Estimated Creatinine Clearance: 68.9 mL/min (by C-G formula based on SCr of 0.52 mg/dL). Liver Function Tests: No results for input(s): AST, ALT, ALKPHOS, BILITOT, PROT, ALBUMIN in the last 168 hours. No results for input(s): LIPASE, AMYLASE in the last 168 hours. No results for input(s): AMMONIA in the last 168 hours. Coagulation Profile: No results for input(s): INR, PROTIME in the last 168 hours. Cardiac Enzymes: No results for input(s): CKTOTAL, CKMB, CKMBINDEX, TROPONINI in the last 168 hours. BNP (last 3 results) No results for input(s): PROBNP in the last 8760 hours. HbA1C: No results for input(s): HGBA1C in the last 72 hours. CBG: No results for input(s): GLUCAP in the last 168  hours. Lipid Profile: No results for input(s): CHOL, HDL, LDLCALC, TRIG, CHOLHDL, LDLDIRECT in the last 72 hours. Thyroid Function Tests: No results for input(s): TSH, T4TOTAL, FREET4, T3FREE, THYROIDAB in the last 72 hours. Anemia Panel: No results for input(s): VITAMINB12, FOLATE, FERRITIN, TIBC, IRON, RETICCTPCT in the last 72 hours. Urine analysis:    Component Value Date/Time   COLORURINE YELLOW 03/30/2019 0600   APPEARANCEUR CLEAR 03/30/2019 0600   LABSPEC 1.013 03/30/2019 0600   PHURINE 6.0 03/30/2019 0600   GLUCOSEU NEGATIVE 03/30/2019 0600   GLUCOSEU NEG mg/dL 12/29/2006 2001   HGBUR NEGATIVE 03/30/2019 0600   BILIRUBINUR NEGATIVE 03/30/2019 0600   KETONESUR NEGATIVE 03/30/2019 0600   PROTEINUR NEGATIVE 03/30/2019 0600   UROBILINOGEN 1 12/29/2006 2001   NITRITE NEGATIVE 03/30/2019 0600   LEUKOCYTESUR NEGATIVE 03/30/2019 0600   Sepsis Labs: @LABRCNTIP (procalcitonin:4,lacticidven:4)  )No results found for this or any previous visit (from the past 240 hour(s)).    Studies: No results found.  Scheduled Meds: . amLODipine  5 mg Oral Daily  . bictegravir-emtricitabine-tenofovir AF  1 tablet Oral Daily  . citalopram  10 mg Oral Daily  . dexamethasone  4 mg Oral  q morning - 10a  . feeding supplement  1 Container Oral Q24H  . fentaNYL  1 patch Transdermal Q72H  . folic acid  1 mg Oral Daily  . hydrALAZINE  50 mg Oral Q8H  . magic mouthwash w/lidocaine  5 mL Oral TID  . multivitamin with minerals  1 tablet Oral Daily  . nystatin  5 mL Oral QID  . pantoprazole  40 mg Oral BID  . polyethylene glycol  17 g Oral BID  . senna-docusate  2 tablet Oral BID  . sodium chloride flush  3 mL Intravenous Q12H    Continuous Infusions: . sodium chloride 250 mL (04/03/19 0603)     LOS: 25 days     Kayleen Memos, MD [facility] Pager [phone]  If 7PM-7AM, please contact night-coverage [email or web address] Password [facility] 04/23/2019, 2:58 PM

## 2019-04-24 ENCOUNTER — Inpatient Hospital Stay (HOSPITAL_COMMUNITY): Payer: Medicaid Other

## 2019-04-24 LAB — CBC
HCT: 25.5 % — ABNORMAL LOW (ref 36.0–46.0)
Hemoglobin: 8.1 g/dL — ABNORMAL LOW (ref 12.0–15.0)
MCH: 26.7 pg (ref 26.0–34.0)
MCHC: 31.8 g/dL (ref 30.0–36.0)
MCV: 84.2 fL (ref 80.0–100.0)
Platelets: 45 10*3/uL — ABNORMAL LOW (ref 150–400)
RBC: 3.03 MIL/uL — ABNORMAL LOW (ref 3.87–5.11)
RDW: 16.5 % — ABNORMAL HIGH (ref 11.5–15.5)
WBC: 5.7 10*3/uL (ref 4.0–10.5)
nRBC: 3.5 % — ABNORMAL HIGH (ref 0.0–0.2)

## 2019-04-24 LAB — TYPE AND SCREEN
ABO/RH(D): O POS
Antibody Screen: NEGATIVE

## 2019-04-24 LAB — BASIC METABOLIC PANEL
Anion gap: 12 (ref 5–15)
BUN: 15 mg/dL (ref 6–20)
CO2: 25 mmol/L (ref 22–32)
Calcium: 9.3 mg/dL (ref 8.9–10.3)
Chloride: 96 mmol/L — ABNORMAL LOW (ref 98–111)
Creatinine, Ser: 0.6 mg/dL (ref 0.44–1.00)
GFR calc Af Amer: >60 mL/min (ref >60)
GFR calc non Af Amer: >60 mL/min (ref >60)
Glucose, Bld: 78 mg/dL (ref 70–99)
Potassium: 3.9 mmol/L (ref 3.5–5.1)
Sodium: 133 mmol/L — ABNORMAL LOW (ref 135–145)

## 2019-04-24 LAB — PHOSPHORUS: Phosphorus: 4 mg/dL (ref 2.5–4.6)

## 2019-04-24 LAB — MAGNESIUM: Magnesium: 2.1 mg/dL (ref 1.7–2.4)

## 2019-04-24 LAB — SARS CORONAVIRUS 2 (TAT 6-24 HRS): SARS Coronavirus 2: NEGATIVE

## 2019-04-24 MED ORDER — LABETALOL HCL 5 MG/ML IV SOLN
5.0000 mg | Freq: Three times a day (TID) | INTRAVENOUS | Status: DC
  Administered 2019-04-24 (×3): 5 mg via INTRAVENOUS
  Filled 2019-04-24 (×3): qty 4

## 2019-04-24 MED ORDER — AMLODIPINE BESYLATE 10 MG PO TABS
10.0000 mg | ORAL_TABLET | Freq: Every day | ORAL | Status: DC
  Filled 2019-04-24: qty 1

## 2019-04-24 MED ORDER — AMLODIPINE BESYLATE 5 MG PO TABS
5.0000 mg | ORAL_TABLET | Freq: Once | ORAL | Status: AC
  Administered 2019-04-24: 5 mg via ORAL
  Filled 2019-04-24: qty 1

## 2019-04-24 MED ORDER — SODIUM CHLORIDE 0.9% IV SOLUTION: Freq: Once | INTRAVENOUS | Status: DC

## 2019-04-24 NOTE — Progress Notes (Addendum)
  NEUROSURGERY PROGRESS NOTE   Received call from Dr Nevada Crane, [facility], regarding patient.  59 year old with history of stage IV parotid adenocarcinoma with brain/meningeal Mets, bipolar 1 disorder, polysubstance abuse, HIV and hypertension who presented to the emergency room with headache, weakness and fatigue on 03/29/2019.  In chart review, it appears on of the main reasons for presenting to the emergency room on 12/24 was requesting SNF placement.  She was initially a hospice patient however felt as though she was overmedicated and left hospice care. Due to continued difficulties caring for self at home, she presented to the ED. On arrival to the emergency room she was tachycardic and was found to have profound anemia with a hemoglobin of 5.3, thrombocytopenia with a platelet count 23, fecal occult blood positive. She was admitted for further workup and management.    Patient was noted to have difficulty swallowing this am.  Dr. Nevada Crane who is her primary hospitalist was notified and obtained a CT scan of the head.  This again demonstrated the left temporal lobe mass, compatible with patient's known tumor however there is now hemorrhage near the mass.  A neurosurgical consultation was requested. I have reviewed the imaging with Dr. Kathyrn Sheriff.  We have compared the CT scan obtained today to prior CT scan that was obtained on 03/29/2019.  Again there is the known left temporal mass.  There is now hemorrhage adjacent to the mass.  There is surrounding edema.  Mild local mass effect. No midline shift. No hydrocephalus. There is no role for acute neurosurgical intervention.   It appears that patient is receiving all of her Heme/Onc care in [place] through [facility]. We have recommended that her primary onc team be notified of her admission as well as her current statu and CT scan.  We also recommend that patient be transferred to where she is receiving her care for further management.  Patient can have a repeat head CT  tomorrow a.m. if she remains in house, sooner as necessary, however it is our recommendation that the patient be transferred to [facility] where she is receiving all of her care. This was communicated with Dr Nevada Crane. Continue decadron. Platelet count 45k. Transfuse platelets in setting of spontaenous hemorrhage.   Patient also noted to be DNR. While she is unlikely to decompensate from this hemorrhage, should she take a turn for the worse, with her underlying stage IV parotid adenocarcinoma, we would rec transition to comfort care.  Please call if we can be of further assistance.  Ferne Reus, PA-C [facility] [employer]

## 2019-04-24 NOTE — Progress Notes (Signed)
04/24/19: Discussed with Dr. Felton Clinton oncologist ([facility] [phone]) which she saw once.  He attempted multiple times to contact her for follow ups.  She never came back.  He states he recommends hospice care and comfort care for Ms.  Kristen Roman.  States he sees in her chart that multiple physicians who also saw her recommended the same.  He does not agree with transferring the patient.

## 2019-04-24 NOTE — Progress Notes (Signed)
Nutrition Follow-up  INTERVENTION:   Once diet advanced: - Continue Boost Breeze po BID, each supplement provides 250 kcal and 9 grams of protein - Continue Magic cup BID with meals, each supplement provides 290 kcal and 9 grams of protein  -Weigh patient when able. Last recorded 03/29/19.  NUTRITION DIAGNOSIS:   Inadequate oral intake related to poor appetite as evidenced by per patient/family report, meal completion < 50%.  Ongoing.  GOAL:   Patient will meet greater than or equal to 90% of their needs  Progressing.  MONITOR:   PO intake, Supplement acceptance, Labs, Weight trends, Skin  ASSESSMENT:   59 y.o. 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 Of note, patient recently discontinued hospice, has poor insight to medical problems and a poor historian. Patient is a reluctant historian with unclear insight to her condition. Pt also reports intermittent blood in her stool over the last week, unable to give further info.  Patient documented as consuming 0-75% of meals.  Pt NPO today as she is now reporting difficulty swallowing. Per neurosurgery note today, pt with hemorrhage adjacent to temporal mass with edema. Palliative care has been consulted for [place]. Speech therapy consulted by MD.   Admission weight: 132 lbs. Recommend new weight measurement when able.  Medications: folic acid tablet, Magic Mouthwash, Multivitamin with minerals daily Labs reviewed: Low Na  Diet Order:   Diet Order            Diet NPO time specified  Diet effective now              EDUCATION NEEDS:   No education needs have been identified at this time  Skin:  Skin Assessment: Skin Integrity Issues: Skin Integrity Issues:: Stage II Stage II: coccyx  Last BM:  1/18  Height:   Ht Readings from Last 1  Encounters:  03/29/19 5' 4.96" (1.65 m)    Weight:   Wt Readings from Last 1 Encounters:  03/29/19 60 kg    Ideal Body Weight:  56.8 kg  BMI:  Body mass index is 22.04 kg/m.  Estimated Nutritional Needs:   Kcal:  1700-1900 kcal  Protein:  80-90 grams  Fluid:  >/= 1.8 L/day  Clayton Bibles, MS, RD, LDN Inpatient Clinical Dietitian Pager: [phone] After Hours Pager: [phone]

## 2019-04-24 NOTE — Progress Notes (Signed)
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  Patient was living at home by herself, had hospice at home care that she discontinued recently. She has complained of intermittent blood in her stool and fecal occult was positive on admission and hemoglobin was 5.3.    Hemoglobin stable 8.6.  04/23/19: Seen and examined.  No acute events overnight.  Awaiting SNF placement.  Started oral nystatin/Magic mouthwash with lidocaine for odynophagia.  04/24/19: Reported on 04/23/19 by bedside RN Hubert Azure of patient having trouble swallowing and complaining of her throat hurting for 3 days.  No reports of new slurred speech.  Oral nystatin and magic mouth wash with lidocaine was ordered to addressed symptoms of odynophagia.  This morning informed that the patient had new slurred speech and drooling.  Stat CT head ordered which showed L temporal hemorrhage around her brain tumor.  No midline shift.  Neurosurgery consulted.  She has not been on pharmacological DVT ppx, anti-platelets or blood thinners due to thrombocytopenia.  This bleeding is likely spontaneous and related to her brain tumor.  Assessment/Plan: Active Problems:   Symptomatic anemia   Anemia   Pressure injury of skin   Pancytopenia (HCC)   Heme positive stool   Generalized weakness   Palliative care by specialist   Goals of care, counseling/discussion  Acute on chronic blood loss anemia likely due to GI bleed versus others Hemoglobin 5.3 on admission.  Likely chronic GI bleed.  Seen by gastroenterology, recommended against endoscopic evaluation due to terminal stage of metastatic cancer. She has received total 3 units of  PRBC Hg 8.1  Acute left temporal hemorrhage CT head done 04/24/19 independently reviewed showed left temporal hemorrhage around her brain tumor.  No midline shift.  Neurosurgery consulted.  She has not been on pharmacological DVT ppx, anti-platelets or blood thinners due to thrombocytopenia.  This bleeding is likely spontaneous and related to her brain tumor.  Dysphagia NPO until evaluated by speech therapy Aspiration precautions  Odynophagia likely related to HIV/ immunosuppression Continue oral nystatin/Magic mouthwash with lidocaine for odynophagia.  Neutropenic fever, HCAP -She was initially placed on broad-spectrum antibiotics, completed 7 days of antibiotics.  Finished antibiotics on 04/05/2019. -Afebrile  Chronic normocytic anemia/severe thrombocytopenia: -Like related to malignancy as well as underlying HIV.  Received 1 unit of platelets on 12/26.   -Heme onc consulted, no further intervention recommending hospice at this point -Platelets trending down to 45K  HIV -Unknown compliance. 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 -Chronic right-sided facial droop, no longer interested in hospice as she did not like the way she was treated -Continue home Decadron to reduce neurogenic edema -Seen by oncology and no further treatments are to be offered and the cancer is terminal -For now symptomatic management.  Multiple pain regimen.   -PT recommended home health but with 24-hour supervision versus SNF   Bipolar disorder -Continuepsychotropics  Left buttock stage II decubitus ulcer -present on admission -local wound care  Disposition: Patient not able to go home due to significant balance issues.  She lives alone. Patient has been waiting in the hospital to find a skilled nursing facility bed.  No local bed offers. Patient has a bed offer from [facility]  and she is declining to go there. Detailed discussion with patient,  she wants [place] to give her some time to see if she can go to [facility] area as patient understands that she may live there until she die and she wants to die in the facility near her family at [facility]. Patient was updated that we will wait for 1 to 2 days and if no local facility available, she will be discharged to any facility available. Patient agreeable to involve hospice in the facility.  Goals of care Palliative care consulted to establish goals of care.  DVT prophylaxis: SCDs Code Status: DNR Family Communication:  Asked patient if she would like me to call her children she declined and stated that she will call them.  Disposition Plan: SNF when bed is available.  Consultants:  Oncology  Palliative care  Infectious disease Gastroenterology  Procedures:  None     Objective: Vitals:   04/23/19 0623 04/23/19 1022 04/23/19 1410 04/24/19 0240  BP: (!) 138/99 136/79 (!) 141/80 (!) 144/97  Pulse: 73 68 67 71  Resp: 18  15   Temp: 98.9 F (37.2 C)  99 F (37.2 C) 98.3 F (36.8 C)  TempSrc: Oral  Oral Oral  SpO2: 98% 99% 97% 99%  Weight:      Height:        Intake/Output Summary (Last 24 hours) at 04/24/2019 1054 Last data filed at 04/24/2019 0500 Gross per 24 hour  Intake 870 ml  Output 600 ml  Net 270 ml   Filed Weights   03/29/19 2100  Weight: 60 kg    Exam:  . General: 59 y.o. year-old female frail-appearing no acute distress.  Alert and interactive.  .  Cardiovascular: Regular rate and rhythm no rubs or gallops.  No JVD or thyromegaly noted.   Marland Kitchen Respiratory: Clear to auscultation no wheezes no rales.  Good respiratory effort. . Abdomen: Nontender normal bowel sounds present.   . Musculoskeletal: 5/5 strength in all 4 extremities.  Sensation intact.  Chronic right-sided facial droop. Marland Kitchen Psych: Mood is appropriate for condition and setting.   Data Reviewed: CBC: Recent Labs  Lab 04/18/19 0526 04/21/19 1120 04/24/19 0607  WBC 5.6 6.2 5.7    NEUTROABS 2.5  --   --   HGB 8.1* 8.6* 8.1*  HCT 26.4* 26.5* 25.5*  MCV 86.3 83.6 84.2  PLT 56* 51* 45*   Basic Metabolic Panel: Recent Labs  Lab 04/24/19 0607  NA 133*  K 3.9  CL 96*  CO2 25  GLUCOSE 78  BUN 15  CREATININE 0.60  CALCIUM 9.3  MG 2.1  PHOS 4.0   GFR: Estimated Creatinine Clearance: 68.9 mL/min (by C-G formula based on SCr of 0.6 mg/dL). Liver Function Tests: No results for input(s): AST, ALT, ALKPHOS, BILITOT, PROT, ALBUMIN in the last 168 hours. No results for input(s): LIPASE, AMYLASE in the last 168 hours. No results for input(s): AMMONIA in the last 168 hours. Coagulation Profile: No results for input(s): INR, PROTIME in the last 168 hours. Cardiac Enzymes: No results for input(s): CKTOTAL, CKMB, CKMBINDEX, TROPONINI in the last 168 hours. BNP (last 3 results) No results for input(s): PROBNP in the last 8760 hours. HbA1C: No results for input(s): HGBA1C in the last 72 hours. CBG: No results for input(s): GLUCAP in the last 168 hours. Lipid Profile: No results for input(s): CHOL, HDL, LDLCALC, TRIG, CHOLHDL, LDLDIRECT in the last 72 hours. Thyroid Function Tests: No results for input(s): TSH, T4TOTAL, FREET4,  T3FREE, THYROIDAB in the last 72 hours. Anemia Panel: No results for input(s): VITAMINB12, FOLATE, FERRITIN, TIBC, IRON, RETICCTPCT in the last 72 hours. Urine analysis:    Component Value Date/Time   COLORURINE YELLOW 03/30/2019 0600   APPEARANCEUR CLEAR 03/30/2019 0600   LABSPEC 1.013 03/30/2019 0600   PHURINE 6.0 03/30/2019 0600   GLUCOSEU NEGATIVE 03/30/2019 0600   GLUCOSEU NEG mg/dL 12/29/2006 2001   HGBUR NEGATIVE 03/30/2019 0600   BILIRUBINUR NEGATIVE 03/30/2019 0600   KETONESUR NEGATIVE 03/30/2019 0600   PROTEINUR NEGATIVE 03/30/2019 0600   UROBILINOGEN 1 12/29/2006 2001   NITRITE NEGATIVE 03/30/2019 0600   LEUKOCYTESUR NEGATIVE 03/30/2019 0600   Sepsis Labs: @LABRCNTIP (procalcitonin:4,lacticidven:4)  )No results found  for this or any previous visit (from the past 240 hour(s)).    Studies: No results found.  Scheduled Meds: . amLODipine  5 mg Oral Daily  . bictegravir-emtricitabine-tenofovir AF  1 tablet Oral Daily  . citalopram  10 mg Oral Daily  . dexamethasone  4 mg Oral q morning - 10a  . feeding supplement  1 Container Oral Q24H  . fentaNYL  1 patch Transdermal Q72H  . folic acid  1 mg Oral Daily  . hydrALAZINE  50 mg Oral Q8H  . labetalol  5 mg Intravenous TID  . magic mouthwash w/lidocaine  5 mL Oral TID  . multivitamin with minerals  1 tablet Oral Daily  . nystatin  5 mL Oral QID  . pantoprazole  40 mg Oral BID  . polyethylene glycol  17 g Oral BID  . senna-docusate  2 tablet Oral BID  . sodium chloride flush  3 mL Intravenous Q12H    Continuous Infusions: . sodium chloride 250 mL (04/03/19 0603)     LOS: 26 days     Kayleen Memos, MD [facility] Pager [phone]  If 7PM-7AM, please contact night-coverage [email or web address] Password [facility] 04/24/2019, 10:54 AM

## 2019-04-24 NOTE — TOC Transition Note (Signed)
Transition of Care [facility]) - CM/SW Discharge Note   Patient Details  Name: Kristen Roman MRN: [id number] Date of Birth: 22-Jul-1960  Transition of Care [facility]) CM/SW Contact:  Lynnell Catalan, RN Phone Number: 04/24/2019, 4:23 PM   Clinical Narrative:    This CM presented pt's only bed offer to her at bedside. Pt was informed that she would need to go there for 30 days and participate in physical therapy or she could go home. Pt states that she will go to [facility]. She states that she understands this is her only bed offer. Covid testing ordered. TOC will follow.    Final next level of care: Skilled Nursing Facility Barriers to Discharge: [place] (PASRR), No SNF bed(No SNF bed offers, PASRR level II, [place] Must Closed in observance of the Holiday.)   Patient Goals and CMS Choice Patient states their goals for this hospitalization and ongoing recovery are:: to be at a facility CMS Medicare.gov Compare Post Acute Care list provided to:: Patient Choice offered to / list presented to : Patient    Discharge Plan and Services In-house Referral: Clinical Social Work   Post Acute Care Choice: [place]                  Social Determinants of Health (SDOH) Interventions     Readmission Risk Interventions No flowsheet data found.

## 2019-04-24 NOTE — Progress Notes (Signed)
At shift change, patient mentioned that she had another stroke on Sunday and that she really needs another scan done due to difficulty swallowing  - I asked the offgoing nurse what was she talking about - she stated she had told her the same thing and she notified the doctor - The offgoing nurse stated the doctor kind of "blew it off" and didn't do anything about it. As the night has progressed, I have noticed changes in the way client talks compared to when I cared for her last Wednesday - Last Wednesday I was able to understand the patient's needs - tonight I have had a hard time understanding her. I also had to hook up suction for client due to excessive drooling / secretions - Patient also complains of increased difficulty with swallowing - Vital Signs are stable: Got another opinion from the other floor nurse Domenica Fail) - she stated she has also changed since she last cared for her. Neuro check remained the same as before. I informed patient I will inform the am nurse to make sure she is reassessed by the doctor to possibly have another scan done.

## 2019-04-24 NOTE — Evaluation (Addendum)
Clinical/Bedside Swallow Evaluation Patient Details  Name: Kristen Roman MRN: [id number] Date of Birth: 1960/12/24  Today's Date: 04/24/2019 Time: SLP Start Time (ACUTE ONLY): 1829 SLP Stop Time (ACUTE ONLY): [id number] SLP Time Calculation (min) (ACUTE ONLY): 20 min  Past Medical History:  Past Medical History:  Diagnosis Date  . Cancer ([place])    "bone cancer"  . Hemorrhoid   . HIV disease ([place])   . Hypertension    Past Surgical History:  Past Surgical History:  Procedure Laterality Date  . ABDOMINAL HYSTERECTOMY    . 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  She had been seen by SLP during admission to hospital 02/2019 and easily passed 3 ounce water test.  Currently she reported increased dysphagia to her MD.  Swallow eval ordered.  CT head 04/24/2019 showed new hemorrhage - left anterior temporal with increased edema of white matter, skull base destructive osseous changes and visualized mandible reflecting metastases, right mastoid and middle ear opacification.  Pt has been receiving magic mouthwash with lidocaine and nyostatin.  Eventual plans for dc to SNF possibly per notes.   Assessment / Plan / Recommendation Clinical Impression  Pt known to this SLP from prior visit in 02/2019.  Her participation in today's evaluation was limited as she reported significant head pain. Pt did willingly consume juice via syringe *self fed* after she declined water via straw.  Minimal anterior labial spillage with decreased labial closure noted but no indication of aspiration.  She did not orally transit/adequately masticate solid trial - orally holding in right sulci - and expectorating stating "I can't swallow it". At this time, recommend full liquid diet - maximizing liquid nutrition for comfort/efficiency.    Note per MD documentation  that hospice has been recommended for pt but she discontinued hospice care due to feeling over-medicated.  Also see that Dr Nevada Crane reached out to oncologist from [facility] who advised comfort care/hospice.   Will follow up briefly for dysphagia management.  Pt stated concerns about possible discharge tomorrow and verbalized concerns about swallowing.  Advised her that people can obtain adequate nutrition via liquids but it is tedious/not pleasurable.  Given pt's poor prognosis, dietary advancement to allow her to chose what she can manage likely is optimal - anticipate she will only consume liquids.    SLP noted pt for possible dc to [facility].  Will follow briefly for po tolerance, advancement readiness.  Pt's RN informed of recommendations and swallow precaution signs provided.  SLP Visit Diagnosis: Dysphagia, oral phase (R13.11);Dysphagia, unspecified (R13.10)    Aspiration Risk  Mild aspiration risk;Risk for inadequate nutrition/hydration    Diet Recommendation (full liquids)  Hopefully with increased pt participation (willingness to consume po), she will tolerate advancement in diet.   Liquid Administration via: Spoon;Cup;Straw(as tolerate, pt uses a syringe) Medication Administration: Crushed with puree(as tolerated, crush with puree, icecream,etc) Compensations: Slow rate;Small sips/bites;Lingual sweep for clearance of pocketing Postural Changes: Seated upright at 90 degrees;Remain upright for at least 30 minutes after po intake    Other  Recommendations Oral Care Recommendations: Oral care BID   Follow up Recommendations        Frequency and Duration min 2x/week  2 weeks       Prognosis Prognosis for Safe Diet Advancement: Fair Barriers to Reach Goals: Motivation;Other (Comment)(medical diagnosis)  Swallow Study   General Date of Onset: 04/24/19 HPI: 59 yo female adm to [facility] on 03/29/2019 with symptomatic anemia - metastatic cancer - parotid adenocarcinoma s/p  XRT - pt reportedly stopped XRT 12-18-2018 due to family member's death.  She had been seen by SLP during admission to hospital 02/2019 and easily passed 3 ounce water test.  Currently she reported increased dysphagia to her MD.  Swallow eval ordered.  CT head 04/24/2019 showed new hemorrhage - left anterior temporal with increased edema of white matter, skull base destructive osseous changes and visualized mandible reflecting metastases, right mastoid and middle ear opacification.  Pt has been receiving magic mouthwash with lidocaine and nyostatin.  Eventual plans for dc to SNF possibly per notes. Diet Prior to this Study: NPO Temperature Spikes Noted: No Respiratory Status: Room air History of Recent Intubation: No Behavior/Cognition: Alert;Distractible;Requires cueing(distractible by reported pain - headache- impacting concentration possibly) Oral Care Completed by SLP: No Oral Cavity - Dentition: Edentulous Vision: Functional for self-feeding Self-Feeding Abilities: Able to feed self Patient Positioning: Upright in bed Baseline Vocal Quality: Low vocal intensity Volitional Cough: Cognitively unable to elicit Volitional Swallow: Unable to elicit(pt did not elicit swallow)    Oral/Motor/Sensory Function Overall Oral Motor/Sensory Function: Moderate impairment(decreased right facial movement, labial seal - h/o bell's palsy)   Ice Chips Ice chips: Not tested   Thin Liquid Thin Liquid: Impaired Oral Phase Impairments: Reduced labial seal Oral Phase Functional Implications: Right anterior spillage Other Comments: pt used syringe to provide herself liquids, declining to drink from a straw    Nectar Thick Nectar Thick Liquid: Not tested   Honey Thick Honey Thick Liquid: Not tested   Puree Puree: Not tested   Solid     Solid: Impaired Presentation: Self Fed Oral Phase Impairments: Reduced labial seal;Reduced lingual movement/coordination Oral Phase Functional Implications: Right lateral sulci  pocketing;Impaired mastication Other Comments: pt expectorated [employer] chocolate sandwich bolus after not orally transiting or masticating, pt stated "I can't swallow it", thus she expectorated it      Macario Golds 04/24/2019,7:29 PM    Kathleen Lime, MS [place] Office [phone]

## 2019-04-24 NOTE — Progress Notes (Signed)
Occupational Therapy Treatment Patient Details Name: Kristen Roman MRN: [id number] DOB: [birth year] Today's Date: 04/24/2019    History of present illness 59 yo female admitted with anemia, weakness, diplopia, neutropenic fever with possible sepsis. Hx of HIV, L2 compression fx, falls, adenocarcinoma of parotid gland with mets, bipolar d/o, polysubstance abuse   OT comments  Pt in bed upon arrival. OT introduced self and pt stated "what do you want?" and placing her index finger to her mouth. OT explained to pt the purpose of my visit, however only agreeable to toileting on [facility] and then wanted go back to bed with no further activity with OT.   Follow Up Recommendations  Supervision/Assistance - 24 hour;SNF    Equipment Recommendations  3 in 1 bedside commode    Recommendations for Other Services      Precautions / Restrictions Precautions Precautions: Fall Restrictions Weight Bearing Restrictions: No       Mobility Bed Mobility Overal bed mobility: Needs Assistance Bed Mobility: Supine to Sit;Sit to Supine     Supine to sit: Supervision Sit to supine: Supervision   General bed mobility comments: Supervision for safety  Transfers Overall transfer level: Needs assistance Equipment used: 1 person hand held assist Transfers: Stand Pivot Transfers Sit to Stand: Min guard Stand pivot transfers: Min guard       General transfer comment: min/guard for steadying    Balance Overall balance assessment: Needs assistance Sitting-balance support: Feet supported Sitting balance-Leahy Scale: Good     Standing balance support: During functional activity Standing balance-Leahy Scale: Fair                             ADL either performed or assessed with clinical judgement   ADL Overall ADL's : Needs assistance/impaired                         Toilet Transfer: Min guard;Stand-pivot   Toileting- Water quality scientist and Hygiene: Min guard;Sit  to/from stand         General ADL Comments: pt refused any other activity besides toileting     Vision Patient Visual Report: No change from baseline     Perception     Praxis      Cognition Arousal/Alertness: Awake/alert Behavior During Therapy: Flat affect Overall Cognitive Status: No family/caregiver present to determine baseline cognitive functioning                                          Exercises     Shoulder Instructions       General Comments      Pertinent Vitals/ Pain       Pain Assessment: No/denies pain Pain Score: 0-No pain  Home Living                                          Prior Functioning/Environment              Frequency  Min 2X/week        Progress Toward Goals  OT Goals(current goals can now be found in the care plan section)  Progress towards OT goals: OT to reassess next treatment     Plan Discharge plan remains appropriate  Co-evaluation                 AM-PAC OT "6 Clicks" Daily Activity     Outcome Measure   Help from another person eating meals?: None Help from another person taking care of personal grooming?: A Little Help from another person toileting, which includes using toliet, bedpan, or urinal?: A Little Help from another person bathing (including washing, rinsing, drying)?: A Little Help from another person to put on and taking off regular upper body clothing?: A Little Help from another person to put on and taking off regular lower body clothing?: A Little 6 Click Score: 19    End of Session Equipment Utilized During Treatment: Gait belt;Rolling walker  OT Visit Diagnosis: Unsteadiness on feet (R26.81);Muscle weakness (generalized) (M62.81)   Activity Tolerance Patient limited by fatigue   Patient Left in bed;with call bell/phone within reach   Nurse Communication          Time: [id number] OT Time Calculation (min): 11 min  Charges: OT General  Charges $OT Visit: 1 Visit OT Treatments $Self Care/Home Management : 8-22 mins     Britt Bottom 04/24/2019, 2:36 PM

## 2019-04-25 ENCOUNTER — Inpatient Hospital Stay (HOSPITAL_COMMUNITY): Payer: Medicaid Other

## 2019-04-25 LAB — PREPARE PLATELET PHERESIS: Unit division: 0

## 2019-04-25 LAB — CBC
HCT: 26.1 % — ABNORMAL LOW (ref 36.0–46.0)
Hemoglobin: 8.2 g/dL — ABNORMAL LOW (ref 12.0–15.0)
MCH: 26.7 pg (ref 26.0–34.0)
MCHC: 31.4 g/dL (ref 30.0–36.0)
MCV: 85 fL (ref 80.0–100.0)
Platelets: 47 10*3/uL — ABNORMAL LOW (ref 150–400)
RBC: 3.07 MIL/uL — ABNORMAL LOW (ref 3.87–5.11)
RDW: 16.6 % — ABNORMAL HIGH (ref 11.5–15.5)
WBC: 4.1 10*3/uL (ref 4.0–10.5)
nRBC: 4.4 % — ABNORMAL HIGH (ref 0.0–0.2)

## 2019-04-25 LAB — BPAM PLATELET PHERESIS
Blood Product Expiration Date: 202101202359
ISSUE DATE / TIME: 202101191357
Unit Type and Rh: 7300

## 2019-04-25 MED ORDER — AMLODIPINE BESYLATE 10 MG PO TABS: 10.0000 mg | ORAL_TABLET | Freq: Every day | ORAL | 0 refills | Status: DC

## 2019-04-25 MED ORDER — LABETALOL HCL 100 MG PO TABS
100.0000 mg | ORAL_TABLET | Freq: Two times a day (BID) | ORAL | Status: DC
  Administered 2019-04-25: 100 mg via ORAL
  Filled 2019-04-25: qty 1

## 2019-04-25 MED ORDER — NYSTATIN 100000 UNIT/ML MT SUSP: 5.0000 mL | Freq: Four times a day (QID) | OROMUCOSAL | 0 refills | Status: DC

## 2019-04-25 MED ORDER — LABETALOL HCL 100 MG PO TABS: 100.0000 mg | ORAL_TABLET | Freq: Two times a day (BID) | ORAL | Status: DC

## 2019-04-25 MED ORDER — HYDRALAZINE HCL 50 MG PO TABS: 50.0000 mg | ORAL_TABLET | Freq: Three times a day (TID) | ORAL | 0 refills | Status: DC

## 2019-04-25 MED ORDER — FENTANYL 25 MCG/HR TD PT72: 1.0000 | MEDICATED_PATCH | TRANSDERMAL | 0 refills | Status: DC

## 2019-04-25 MED ORDER — AMLODIPINE BESYLATE 10 MG PO TABS: 10.0000 mg | ORAL_TABLET | Freq: Every day | ORAL | Status: DC

## 2019-04-25 MED ORDER — BIKTARVY 50-200-25 MG PO TABS: 1.0000 | ORAL_TABLET | Freq: Every day | ORAL | 0 refills | Status: DC

## 2019-04-25 MED ORDER — OXYCODONE HCL 5 MG PO TABS: 5.0000 mg | ORAL_TABLET | Freq: Three times a day (TID) | ORAL | 0 refills | Status: DC | PRN

## 2019-04-25 MED ORDER — PANTOPRAZOLE SODIUM 40 MG PO TBEC: 40.0000 mg | DELAYED_RELEASE_TABLET | Freq: Two times a day (BID) | ORAL | 0 refills | Status: DC

## 2019-04-25 MED ORDER — LABETALOL HCL 100 MG PO TABS: 100.0000 mg | ORAL_TABLET | Freq: Two times a day (BID) | ORAL | 0 refills | Status: DC

## 2019-04-25 MED ORDER — HYDROMORPHONE HCL 1 MG/ML IJ SOLN
1.0000 mg | INTRAMUSCULAR | Status: DC | PRN
  Administered 2019-04-25 (×3): 1 mg via INTRAVENOUS
  Filled 2019-04-25 (×3): qty 1

## 2019-04-25 MED ORDER — MAGIC MOUTHWASH W/LIDOCAINE: 5.0000 mL | Freq: Three times a day (TID) | ORAL | 0 refills | Status: DC

## 2019-04-25 MED ORDER — ALPRAZOLAM 0.25 MG PO TABS: 0.2500 mg | ORAL_TABLET | Freq: Two times a day (BID) | ORAL | 0 refills | Status: DC | PRN

## 2019-04-25 NOTE — Discharge Summary (Signed)
Discharge Summary  Kristen Roman [id number] DOB: [birth year]  PCP: [place] date: 03/29/2019 Discharge date: 04/25/2019  Time spent: 35 minutes  Recommendations for Outpatient Follow-up:  1. Continue with hospice care   Discharge Diagnoses:  Active Hospital Problems   Diagnosis Date Noted  . Generalized weakness   . Palliative care by specialist   . Goals of care, counseling/discussion   . Pressure injury of skin 03/31/2019  . Pancytopenia ([place])   . Heme positive stool   . Symptomatic anemia 03/29/2019  . Anemia 03/29/2019    Resolved Hospital Problems  No resolved problems to display.    Diet recommendation: Pleasure feedings.  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 Patient was living at home by herself, had hospice at home care that she discontinued recently. She has complained of intermittent blood in her stool and fecal occult was positive on admission and hemoglobin was 5.3.  Transfused total 3 U PRBCs and 1U platelet (with thrombocytopenia in the setting of brain hemorrhage).  Odynophagia, treated with oral nystatin and Magic mouthwash with lidocaine.  Hospital course complicated by left temporal hemorrhage around her brain tumor.  No midline shift.  Neurosurgery consulted, no indication for surgical intervention.  Repeated CT head without worsening of hemorrhage.  Seen by palliative care team, patient has made decision to restart hospice care.  Was seen by speech therapist 1/19 with recommendation for full liquid diet.   04/24/19: Discussed with Dr. Felton Clinton  oncologist ([facility] [phone]) which the patient saw once.  He attempted multiple times to contact her for follow ups.  She never returned.  He states he recommends hospice care and comfort care for Ms.  Kristen Roman.   04/25/19: Seen and examined.  Frail-appearing.  Vital signs reviewed and are stable.  Seen by palliative care team.  Plan for hospice care.    Hospital Course:  Active Problems:   Symptomatic anemia   Anemia   Pressure injury of skin   Pancytopenia ([facility])   Heme positive stool   Generalized weakness   Palliative care by specialist   Goals of care, counseling/discussion  Acute on chronic blood loss anemia likely due to GI bleed versus others Hemoglobin 5.3 on admission. Likely chronic GI bleed. Seen by gastroenterology, recommended against endoscopic evaluation due to terminal stage of metastatic cancer. She has received total 3 units of PRBC and 1U platelet (with thrombocytopenia in the setting of brain hemorrhage).  Hg 8.1>> 8.2  Acute spontaneous left temporal hemorrhage CT head done 04/24/19 independently reviewed showed left temporal hemorrhage around her brain tumor.  No midline shift.  Neurosurgery consulted.  no indication for surgical intervention.  Repeated CT head 1/20 without worsening of hemorrhage.    Dysphagia Was seen by speech therapist 1/19 with recommendation for full liquid diet. Continue aspiration precautions  Odynophagia likely related to HIV/ immunosuppression Continue oral nystatin/Magic mouthwash with lidocaine for odynophagia.  Neutropenic fever, HCAP -She was initially placed on broad-spectrum antibiotics, completed 7 days of antibiotics.  Finished antibiotics on 04/05/2019. -Afebrile  Chronic normocytic anemia/severe thrombocytopenia: -Like related  to malignancy as well as underlying HIV. Received 1 unit of platelets on 12/26.  -Heme onc consulted, no further intervention recommending hospice at this  point -Platelets trending down to 45K, transfused 1 unit of platelet on 04/24/2019.  HIV -Unknown compliance. -continue [employer] with infectious disease  Stage IV parotid adenocarcinoma with mets to brain/meninges -Chronic right-sided facial droop -Continue home Decadron to reduce neurogenic edema -Seen by oncology and no further treatments are to be offered and the cancer is terminal -For now symptomatic management. Multiple pain regimen.  Patient made decision for hospice.  Palliative care team following.  Bipolar disorder -Continuehome medications.  Left buttock stage II decubitus ulcer -present on admission -Continue local wound care  Goals of care DNR Palliative care team following. Patient will decision for hospice care.   Code Status:DNR    Consultants: Oncology  Palliative care  Infectious disease Gastroenterology  Procedures: None     Discharge Exam: BP 136/80 (BP Location: Left Arm)   Pulse (!) 57   Temp 98.3 F (36.8 C) (Oral)   Resp 16   Ht 5' 4.96" (1.65 m)   Wt 60 kg   SpO2 97%   BMI 22.04 kg/m  . General: 59 y.o. year-old female well developed well nourished in no acute distress.  Alert and oriented x3. . Cardiovascular: Regular rate and rhythm with no rubs or gallops.  No thyromegaly or JVD noted.   Marland Kitchen Respiratory: Clear to auscultation with no wheezes or rales. Good inspiratory effort. . Abdomen: Soft nontender nondistended with normal bowel sounds x4 quadrants. . Musculoskeletal: No lower extremity edema. 2/4 pulses in all 4 extremities. Marland Kitchen Psychiatry: Mood is appropriate for condition and setting  Discharge Instructions You were cared for by a hospitalist during your hospital stay. If you have any questions about your discharge medications or the care you received while you were in the hospital after you are discharged, you can call the unit and asked to speak with the hospitalist on call if the hospitalist that  took care of you is not available. Once you are discharged, your primary care physician will handle any further medical issues. Please note that NO REFILLS for any discharge medications will be authorized once you are discharged, as it is imperative that you return to your primary care physician (or establish a relationship with a primary care physician if you do not have one) for your aftercare needs so that they can reassess your need for medications and monitor your lab values.   Allergies as of 04/25/2019      Reactions   Amoxicillin Itching   Tolerated cefepime 03/2019   Penicillin G Itching   Did it involve swelling of the face/tongue/throat, SOB, or low BP? No Did it involve sudden or severe rash/hives, skin peeling, or any reaction on the inside of your mouth or nose? No Did you need to seek medical attention at a hospital or doctor's office? yes When did it last happen?recently If all above answers are "NO", may proceed with cephalosporin use. Tolerated cefepime 03/2019      Medication List    STOP taking these medications   Acetaminophen Extra Strength 500 MG tablet Generic drug: acetaminophen   Biktarvy 50-200-25 MG Tabs tablet Generic drug: bictegravir-emtricitabine-tenofovir AF   citalopram 10 MG tablet Commonly known as: CELEXA   dexamethasone 4 MG tablet Commonly known as: DECADRON   feeding supplement (ENSURE ENLIVE) Liqd   ferrous sulfate 325 (65 FE) MG tablet   folic acid 1  MG tablet Commonly known as: FOLVITE   methadone 10 MG tablet Commonly known as: DOLOPHINE   multivitamin with minerals Tabs tablet   omeprazole 20 MG capsule Commonly known as: PRILOSEC   Oxycodone HCl 10 MG Tabs   OxyCONTIN 15 mg 12 hr tablet Generic drug: oxyCODONE   Senna-S 8.6-50 MG tablet Generic drug: senna-docusate            Durable Medical Equipment  (From admission, onward)         Start     Ordered   04/24/19 1455  For home use only DME 3 n 1  Once      04/24/19 1454         Allergies  Allergen Reactions  . Amoxicillin Itching    Tolerated cefepime 03/2019  . Penicillin G Itching    Did it involve swelling of the face/tongue/throat, SOB, or low BP? No Did it involve sudden or severe rash/hives, skin peeling, or any reaction on the inside of your mouth or nose? No Did you need to seek medical attention at a hospital or doctor's office? yes When did it last happen?recently If all above answers are "NO", may proceed with cephalosporin use.  Tolerated cefepime 03/2019   Follow-up [place]. Call in 1 day(s).   Specialty: Internal Medicine Why: Please call for a post hospital follow up appointment. Contact information: [id number] [place] [address] [phone]        Thayer Headings, MD .   Specialty: Infectious Diseases Contact information: [address] [place] [address] [phone]            The results of significant diagnostics from this hospitalization (including imaging, microbiology, ancillary and laboratory) are listed below for reference.    Significant Diagnostic Studies: CT HEAD WO CONTRAST  Result Date: 04/25/2019 CLINICAL DATA:  Follow-up left temporal hemorrhage. EXAM: CT HEAD WITHOUT CONTRAST TECHNIQUE: Contiguous axial images were obtained from the base of the skull through the vertex without intravenous contrast. COMPARISON:  04/24/2019 FINDINGS: Brain: A 1.7 x 1.3 cm hematoma anteriorly in the left temporal lobe is unchanged, as is mild-to-moderate surrounding vasogenic edema. This is again noted to be adjacent to left middle cranial fossa tumor shown on previous MRI. No new intracranial hemorrhage, acute infarct, midline shift, or extra-axial fluid collection is identified. There is mild cerebral atrophy. Vascular: No hyperdense vessel. Skull: Permeative, destructive changes bilaterally in the skull base with  involvement of the mandible as previously seen. Sinuses/Orbits: Visualized paranasal sinuses are clear. Unchanged large right and small to moderate left mastoid and right middle ear effusions. Unremarkable orbits. Other: None. IMPRESSION: Unchanged left temporal lobe hematoma and surrounding edema. Electronically Signed   By: Logan Bores M.D.   On: 04/25/2019 06:09   CT HEAD WO CONTRAST  Result Date: 04/24/2019 CLINICAL DATA:  Dysphagia, history of parotid adenocarcinoma with metastases EXAM: CT HEAD WITHOUT CONTRAST TECHNIQUE: Contiguous axial images were obtained from the base of the skull through the vertex without intravenous contrast. COMPARISON:  CT 03/29/2019 FINDINGS: Brain: There is a new 1.6 x 1.2 cm area of hemorrhage within the anterior left temporal lobe. There is increased edema in the surrounding white matter without significant mass effect. This is adjacent to left middle cranial fossa tumor. Gray-white differentiation is preserved there is no hydrocephalus. Vascular: Unremarkable. Skull: Permeative appearance and erosive changes of inferior calvarium, skull base, and right greater than left included  mandible. Extraosseous extension is better seen on MRI. Sinuses/Orbits: No acute abnormality. Other: Chronic right mastoid and middle ear opacification. Increased mild patchy left mastoid opacification. IMPRESSION: New small area of hemorrhage within the anterior left temporal lobe with associated increase in edema. Regional mass effect is mild. This is adjacent to previously identified left middle cranial fossa tumor that is likely predominately extra-axial but may have a component of parenchymal invasion given presence of edema on the prior MRI. Similar appearance of permeative and destructive osseous changes primarily at the skull base and visualized mandible likely reflecting metastatic disease. These results were called by telephone at the time of interpretation on 04/24/2019 at 10:08 am to  provider [facility] , who verbally acknowledged these results. Electronically Signed   By: Macy Mis M.D.   On: 04/24/2019 10:14   CT Head Wo Contrast  Result Date: 03/29/2019 CLINICAL DATA:  History of radiation therapy for a right parotid tumor. Primary of bone destruction at the skull base with a soft tissue component on recent MRI, suspicious for metastatic disease. The patient also has dural enhancement and dural based mass in the left middle cranial fossa on the recent MRI. No reported symptoms at this time. EXAM: CT HEAD WITHOUT CONTRAST TECHNIQUE: Contiguous axial images were obtained from the base of the skull through the vertex without intravenous contrast. COMPARISON:  Brain MR dated 03/15/2019. Head CT dated 02/06/2019. FINDINGS: Brain: Stable mildly enlarged ventricles and cortical sulci. Oval area of low density in the left temporal lobe in the middle cranial fossa, corresponding to the mass seen on the recent MRI. Calcification in the pituitary gland with no pituitary mass seen on the recent MR. No intracranial hemorrhage or CT evidence of acute infarction. Vascular: No hyperdense vessel or unexpected calcification. Skull: Previously demonstrated permeative bone destruction at the skull base is again demonstrated, limited by motion artifacts on the current images. Again demonstrated is opacification of the mastoid air cells on the right. Sinuses/Orbits: The included portions are unremarkable, limited by motion artifacts. Right mastoid air cell opacification with mild progression since 02/06/2019. Other: None. IMPRESSION: 1. Limited examination due to motion artifacts with no acute abnormality seen. 2. Grossly stable left temporal lobe mass, compatible with the patient's known tumor. 3. Grossly stable permeative bone destruction at the skull base and mildly progressive opacification of the right mastoid air cells, suspicious for metastatic disease. 4. Pituitary gland calcifications, possibly  due to the patient's previous radiation therapy. 5. Stable mild diffuse cerebral and cerebellar atrophy. Electronically Signed   By: Claudie Revering M.D.   On: 03/29/2019 14:40   CT ANGIO CHEST PE W OR WO CONTRAST  Result Date: 03/30/2019 CLINICAL DATA:  Shortness of breath. Evaluate for pulmonary embolus. EXAM: CT ANGIOGRAPHY CHEST WITH CONTRAST TECHNIQUE: Multidetector CT imaging of the chest was performed using the standard protocol during bolus administration of intravenous contrast. Multiplanar CT image reconstructions and MIPs were obtained to evaluate the vascular anatomy. CONTRAST:  22mL OMNIPAQUE IOHEXOL 350 MG/ML SOLN COMPARISON:  02/03/2019 FINDINGS: Cardiovascular: Heart is enlarged. Coronary artery calcification is evident. Atherosclerotic calcification is noted in the wall of the thoracic aorta. No filling defect within the opacified pulmonary arteries to suggest the presence of an acute pulmonary embolus Mediastinum/Nodes: No mediastinal lymphadenopathy. There is no hilar lymphadenopathy. The esophagus has normal imaging features. There is no axillary lymphadenopathy. Lungs/Pleura: Bandlike areas of subpleural opacity are seen in the lower lungs bilaterally, right greater than left. This is likely atelectatic although component of  infectious/inflammatory etiology not excluded. No pneumothorax or pleural effusion. Upper Abdomen: Unremarkable. Musculoskeletal: The widespread bony metastatic disease is similar in the interval. Review of the MIP images confirms the above findings. IMPRESSION: 1. No CT evidence for acute pulmonary embolus. 2. Areas of subpleural bandlike consolidation in the lower lungs, right greater than left. This is probably mainly atelectatic although component of underlying infectious/inflammatory etiology not entirely excluded. 3. Similar appearance widespread bony metastatic disease. Electronically Signed   By: Misty Stanley M.D.   On: 03/30/2019 14:51   DG Chest Port 1  View  Result Date: 03/30/2019 CLINICAL DATA:  Fever EXAM: PORTABLE CHEST 1 VIEW COMPARISON:  February 08, 2019, chest CT February 02, 2019 FINDINGS: The mediastinal contour and cardiac silhouette are stable. The aorta is tortuous. There is interval developed opacity of the right perihilar region. There is no pulmonary edema or pleural effusion. Degenerative joint changes of bilateral shoulders are noted. IMPRESSION: New opacity of the right perihilar region. Further evaluation with a chest CT is recommended to exclude underlying mass. Electronically Signed   By: Abelardo Diesel M.D.   On: 03/30/2019 08:18    Microbiology: Recent Results (from the past 240 hour(s))  SARS CORONAVIRUS 2 (TAT 6-24 HRS) Nasopharyngeal Nasopharyngeal Swab     Status: None   Collection Time: 04/24/19 11:14 AM   Specimen: Nasopharyngeal Swab  Result Value Ref Range Status   SARS Coronavirus 2 NEGATIVE NEGATIVE Final    Comment: (NOTE) SARS-CoV-2 target nucleic acids are NOT DETECTED. The SARS-CoV-2 RNA is generally detectable in upper and lower respiratory specimens during the acute phase of infection. Negative results do not preclude SARS-CoV-2 infection, do not rule out co-infections with other pathogens, and should not be used as the sole basis for treatment or other patient management decisions. Negative results must be combined with clinical observations, patient history, and epidemiological information. The expected result is Negative. Fact Sheet for Patients: SugarRoll.be Fact Sheet for Healthcare Providers: https://www.woods-mathews.com/ This test is not yet approved or cleared by the [place] FDA and  has been authorized for detection and/or diagnosis of SARS-CoV-2 by FDA under an Emergency Use Authorization (EUA). This EUA will remain  in effect (meaning this test can be used) for the duration of the COVID-19 declaration under Section 56 4(b)(1) of the Act, 21  U.S.C. section 360bbb-3(b)(1), unless the authorization is terminated or revoked sooner. Performed at [facility], [place] [address], [place], [place] [address]      Labs: Basic Metabolic Panel: Recent Labs  Lab 04/24/19 0607  NA 133*  K 3.9  CL 96*  CO2 25  GLUCOSE 78  BUN 15  CREATININE 0.60  CALCIUM 9.3  MG 2.1  PHOS 4.0   Liver Function Tests: No results for input(s): AST, ALT, ALKPHOS, BILITOT, PROT, ALBUMIN in the last 168 hours. No results for input(s): LIPASE, AMYLASE in the last 168 hours. No results for input(s): AMMONIA in the last 168 hours. CBC: Recent Labs  Lab 04/21/19 1120 04/24/19 0607 04/25/19 0629  WBC 6.2 5.7 4.1  HGB 8.6* 8.1* 8.2*  HCT 26.5* 25.5* 26.1*  MCV 83.6 84.2 85.0  PLT 51* 45* 47*   Cardiac Enzymes: No results for input(s): CKTOTAL, CKMB, CKMBINDEX, TROPONINI in the last 168 hours. BNP: BNP (last 3 results) No results for input(s): BNP in the last 8760 hours.  ProBNP (last 3 results) No results for input(s): PROBNP in the last 8760 hours.  CBG: No results for input(s): GLUCAP in the  last 168 hours.     Signed:  Kayleen Memos, MD [facility] 04/25/2019, 1:49 PM

## 2019-04-25 NOTE — TOC Transition Note (Signed)
Transition of Care [facility]) - CM/SW Discharge Note   Patient Details  Name: Kristen Roman MRN: [id number] Date of Birth: 1960/07/14  Transition [facility]) CM/SW Contact:  Ahmaad Neidhardt, Marjie Skiff, RN Phone Number: 04/25/2019, 1:41 PM   Clinical Narrative:    Pt has now decided that she wants to go to [facility] [place]. This CM contacted liaison for bed availability. Bed available today and PTAR pick up scheduled for 5pm. RN to call report to [phone]                       Discharge Plan and Services In-house Referral: Clinical Social Work   Post Acute Care Choice: [place]                               Social Determinants of Health (SDOH) Interventions     Readmission Risk Interventions No flowsheet data found.

## 2019-04-25 NOTE — Progress Notes (Signed)
PT Cancellation Note  Patient Details Name: CASSIDY PATYK MRN: [id number] DOB: [birth year]   Cancelled Treatment:    Reason Eval/Treat Not Completed: Patient declined ambulation or transfer to [facility]. Stated she is leaving today. Noted she has decided on hospice. Will check back in a few days if pt hasn't discharged to see if she has any further needs.   Galen Manila 04/25/2019, 11:45 AM
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
## 2019-04-25 NOTE — Discharge Instructions (Signed)
Deconditioning Deconditioning refers to the changes in the body that occur during a period of inactivity. The changes happen in the heart, lungs, and muscles. They make you feel tired and weak (fatigued) and decrease your ability to be active. The three stages of deconditioning include:  Mild deconditioning. This is a change in your ability to do your usual exercise activities, such as running, biking, or swimming.  Moderate deconditioning. This is a change in your ability to do normal everyday activities, such as walking, shopping for groceries, and doing chores.  Severe deconditioning. In this stage, you may not be able to do minimal activity or usual self-care. What are the causes? Deconditioning can occur after only a few days of inactivity. The longer the period of inactivity, the more severe the deconditioning will be, and the longer it will take to return to your previous level of functioning. Deconditioning is often caused by inactivity due to:  Illnesses, such as cancer, stroke, heart attack, fibromyalgia, and chronic fatigue syndrome.  Injuries, especially back injuries, broken bones, and injuries to soft tissues, such as ligaments and tendons.  A long stay in the hospital.  Pregnancy, especially if long periods of bed rest are needed. What increases the risk? The following factors may make you more likely to develop this condition:  Staying in the hospital or being on bed rest.  Obesity.  Poor nutrition.  Being an older adult.  Having an injury or illness that affects your movement and activity. What are the signs or symptoms? Symptoms of this condition include:  Weakness and tiredness.  Shortness of breath with minor physical effort (exertion).  A heartbeat that is faster than normal. You may not notice this without taking your pulse.  Pain or discomfort with activity.  Decreased strength, endurance, and balance.  Difficulty doing your usual forms of  exercise.  Difficulty doing activities of daily living, such as grocery shopping or chores. You may also have problems walking around the house and doing basic self-care, such as getting to the bathroom, preparing meals, or doing laundry. How is this diagnosed? This condition is diagnosed based on your medical history and a physical exam. During the physical exam, your health care provider will check for signs of deconditioning, such as:  Decreased size of muscles.  Decreased strength.  Trouble with balance.  Shortness of breath or a heart rate that is faster than normal after minor exertion. How is this treated? Treatment for this condition involves an exercise program in which activity is increased slowly. Your health care provider will tell you which exercises are right for you. The exercise program will likely include:  Aerobic exercise. This type of exercise helps improve the functioning of the heart, lungs, and muscles.  Strength training. This type of exercise helps increase muscle size and strength. Both of these types of exercise will improve your endurance. You may be referred to a physical therapist who can create a safe strengthening program for you to follow. Follow these instructions at home: Eating and drinking   Eat a healthy, well-balanced diet. This includes: ? Proteins, such as lean meats and fish, to build muscles. ? Fresh fruits and vegetables. ? Carbohydrates, such as whole grains, to boost energy.  Drink enough fluid to keep your urine pale yellow. Activity   Follow the exercise program that is recommended by your health care provider or physical therapist.  Do not increase your exercise any faster than directed. General instructions  Take over-the-counter and prescription medicines only  as told by your health care provider.  Do not use any products that contain nicotine or tobacco, such as cigarettes, e-cigarettes, and chewing tobacco. If you need help  quitting, ask your health care provider.  Keep all follow-up visits as told by your health care provider. This is important. Contact a health care provider if:  You are not able to do the recommended exercise program.  You are becoming more and more tired and weak.  You become light-headed when rising to a sitting or standing position.  Your level of endurance decreases after it has improved. Get help right away if you:  Have chest pain.  Are very short of breath.  Have any episodes of fainting. Summary  Deconditioning refers to the changes in the body that occur during a period of inactivity.  Deconditioning happens in the heart, lungs, and muscles. The changes make you feel tired and weak and decrease your ability to be active.  Treatment for deconditioning involves an exercise program in which activity is increased slowly. This information is not intended to replace advice given to you by your health care provider. Make sure you discuss any questions you have with your health care provider. Document Revised: 08/17/2018 Document Reviewed: 08/17/2018 Elsevier Patient Education  [place] Cancer-Related Fatigue Fatigue is the most common side effect of cancer and treatments for cancer such as chemotherapy and radiation. It can make you feel tired, weak, and drained of energy even after a small amount of activity. Unlike the fatigue that healthy people feel, cancer-related fatigue may not go away with sleep or rest. Fatigue is typically worse during treatment, but survivors may continue to feel some fatigue after treatment ends. There are several things you can do to help manage your fatigue. What can I do to help prevent cancer-related fatigue? Cancer treatment, low blood counts, appetite changes, and changes in sleep routines can all cause fatigue. Some ways to reduce or prevent fatigue include:  Eating healthy and drinking enough fluid to stay hydrated. Talk  with a nutritionist or dietitian who can help you plan the best diet for you.  Staying active and getting regular exercise. Check with your cancer care team first to make sure this is safe.  Getting plenty of rest and taking it easy. Try taking a few short naps or rest breaks throughout the day. Tell your health care provider about any cancer-related side effects. He or she may be able to adjust your medicines or treatment to reduce your fatigue. Follow these instructions at home: Lifestyle   Exercise regularly. ? Aim for 20 minutes of moderate exercise 4-5 times a week, or 60 minutes 3 times a week. ? Examples of moderate exercise include walking, biking, or swimming. Ask your health care provider what activities are safe for you. ? Remember to pace yourself and take short rest breaks.  Find an activity or hobby that you enjoy, and spend 20-30 minutes on it a few times a week. This can help improve your mood, your focus, and your overall mental health.  Get plenty of rest and keep a regular sleep routine. ? Go to bed at the same time every night. ? Only use your bed for sleep. ? Avoid noise during sleep.  Take short naps or rest breaks during the day. Do not nap for more than 20 minutes.  Try to lower your stress. If you need help with this, talk with your health care provider about ways to manage stress.  Some things you can try include: ? Doing mind and body exercises such as meditation, yoga, tai chi, or qigong. ? Writing in a journal. ? Making a to-do list and prioritizing only the important tasks. Eating and drinking   Eat a healthy diet that includes fresh fruits and vegetables, lean proteins, healthy fats, low-fat dairy products, and whole grains.  Avoid simple, refined sugars, such as soft drinks and sweets.  Try eating small meals and snacks throughout the day. A piece of fruit and some nuts are good choices for snacking.  Try to drink at least 8 glasses of water every  day.  Limit the amount of alcohol and caffeine that you drink. Medicines  Talk with your health care provider about prescription medicines and over-the-counter vitamins and supplements that you take. Where to find support Your health care provider or cancer care team will be able to give you advice about how to cope with fatigue. They can also guide you in making health and lifestyle changes that may help you feel less tired. Your health care provider may also refer you to a therapist for counseling. Talking with a therapist may help you reduce fatigue by working through any stresses or fears that you may have. Where to find more information There are a number of trusted print and online resources that can help you learn more about cancer and its side effects. Ask your health care provider what he or she recommends. You can start with:  The American Cancer Society: www.cancer.org  The American Society of Clinical Oncology: www.cancer.net  The [employer]: www.cancer.gov Contact a health care provider if:  You feel depressed.  Your fatigue gets worse or does not go away with sleep or rest.  You are unable to sleep at night.  You become confused or you are not able to think clearly.  You feel short of breath, feel dizzy, or have a racing heartbeat after little activity.  You are thinking about changing your medicines or treatment.  You would like to make changes to your diet or are thinking about taking dietary supplements. Summary  Fatigue is the most common side effect of cancer treatments such as chemotherapy and radiation.  Unlike the fatigue that healthy people feel, cancer-related fatigue may not go away with sleep and rest alone.  There are several things you can do to help manage your fatigue, including eating a healthy diet, getting exercise, and keeping a regular sleep routine.  Your health care provider or cancer care team will be able to give you advice  about how to cope with fatigue. They can also guide you in making health and lifestyle changes that may help you feel less tired. This information is not intended to replace advice given to you by your health care provider. Make sure you discuss any questions you have with your health care provider. Document Revised: 03/04/2017 Document Reviewed: 11/17/2015 Elsevier Patient Education  [place].   Weakness Weakness is a lack of strength. You may feel weak all over your body (generalized), or you may feel weak in one part of your body (focal). There are many potential causes of weakness. Sometimes, the cause of your weakness may not be known. Some causes of weakness can be serious, so it is important to see your doctor. Follow these instructions at home: Activity  Rest as needed.  Try to get enough sleep. Most adults need 7-8 hours of sleep each night. Talk to your doctor about how much  sleep you need each night.  Do exercises, such as arm curls and leg raises, for 30 minutes at least 2 days a week or as told by your doctor.  Think about working with a physical therapist or trainer to help you get stronger. General instructions   Take over-the-counter and prescription medicines only as told by your doctor.  Eat a healthy, well-balanced diet. This includes: ? Proteins to build muscles, such as lean meats and fish. ? Fresh fruits and vegetables. ? Carbohydrates to boost energy, such as whole grains.  Drink enough fluid to keep your pee (urine) pale yellow.  Keep all follow-up visits as told by your doctor. This is important. Contact a doctor if:  Your weakness does not get better or it gets worse.  Your weakness affects your ability to: ? Think clearly. ? Do your normal daily activities. Get help right away if you:  Have sudden weakness on one side of your face or body.  Have chest pain.  Have trouble breathing or shortness of breath.  Have problems with your  vision.  Have trouble talking or swallowing.  Have trouble standing or walking.  Are light-headed.  Pass out (lose consciousness). Summary  Weakness is a lack of strength. You may feel weak all over your body or just in one part of your body.  There are many potential causes of weakness. Sometimes, the cause of your weakness may not be known.  Rest as needed, and try to get enough sleep. Most adults need 7-8 hours of sleep each night.  Eat a healthy, well-balanced diet. This information is not intended to replace advice given to you by your health care provider. Make sure you discuss any questions you have with your health care provider. Document Revised: 10/26/2017 Document Reviewed: 10/26/2017 Elsevier Patient Education  [place].

## 2019-04-25 NOTE — Progress Notes (Signed)
Patient will be DC and transfer today to Hospice home at [facility]. I notified patient with transfer and she agreed. A detailed over the phone report was provided to [facility] at said facility. Patient is alert/oriented X3 and verbalized understanding of today's plan

## 2019-06-04 DEATH — deceased

## 2021-05-14 IMAGING — CT CT HEAD WITHOUT CONTRAST
3 of 7 series · 15 of 47 positions shown, 18 images · non-contrast
Comparison: September 20, 2018

CLINICAL DATA: Numerous falls, headache

EXAM:
CT HEAD WITHOUT CONTRAST
CT CERVICAL SPINE WITHOUT CONTRAST
TECHNIQUE: Multidetector CT imaging of the head and cervical spine was
performed following the standard protocol without intravenous
contrast. Multiplanar CT image reconstructions of the cervical spine
were also generated.

[Series 5: sagittals · sagittal · 0.33mm/px · 2 of 90 slices shown]
[im 30/90  brain]
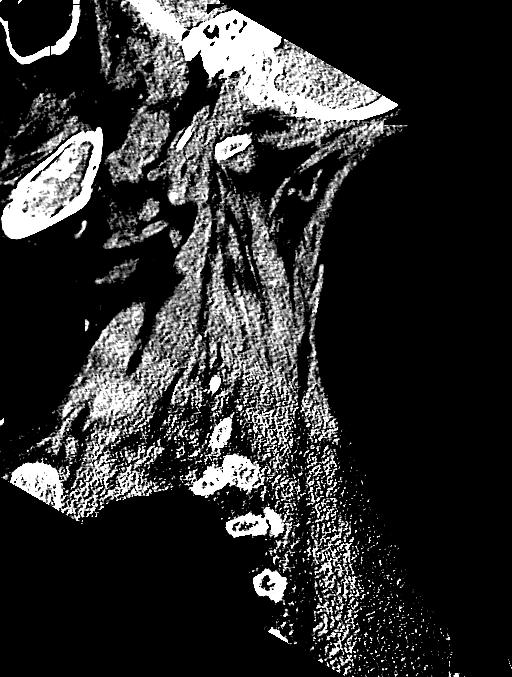
[im 60/90  brain]
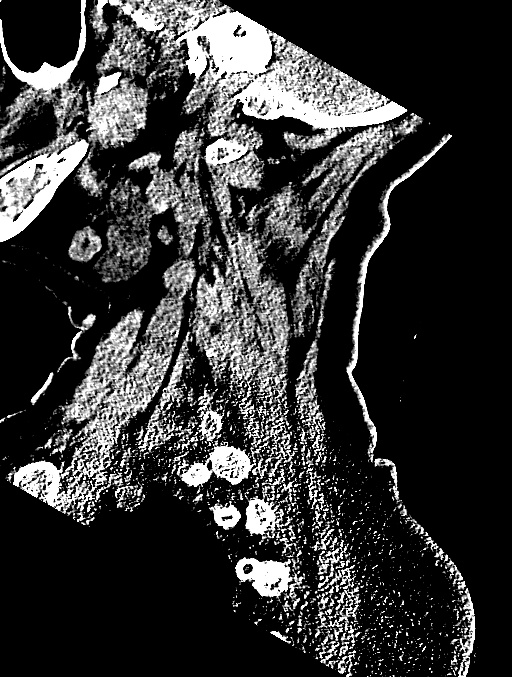

[Series 6: orthogonals · axial · 0.33mm/px · z∈[-152,+14]mm · 10 of 112 slices shown, 13 images]
[im 8/112  brain]
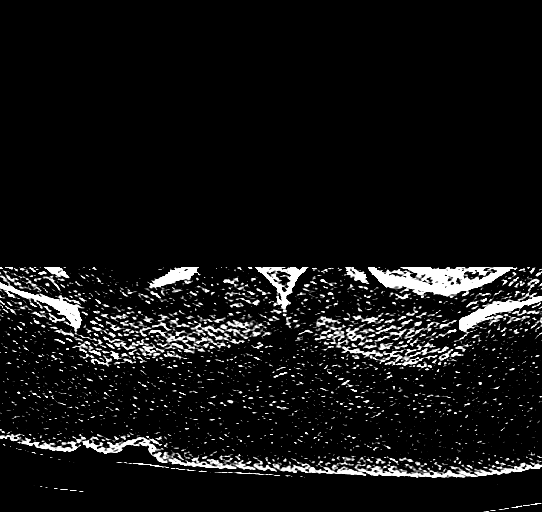
[im 8/112  bone]
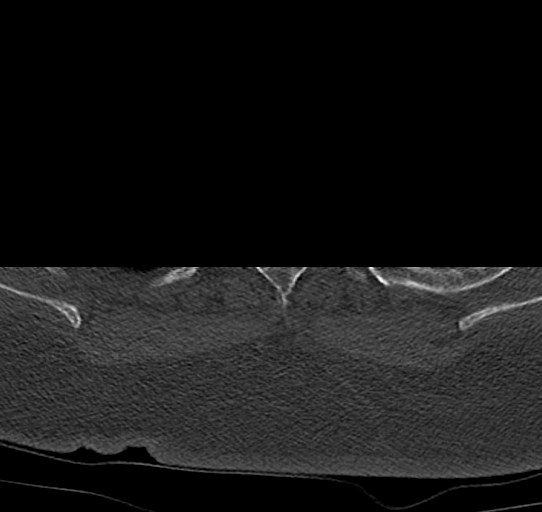
[im 16/112  brain]
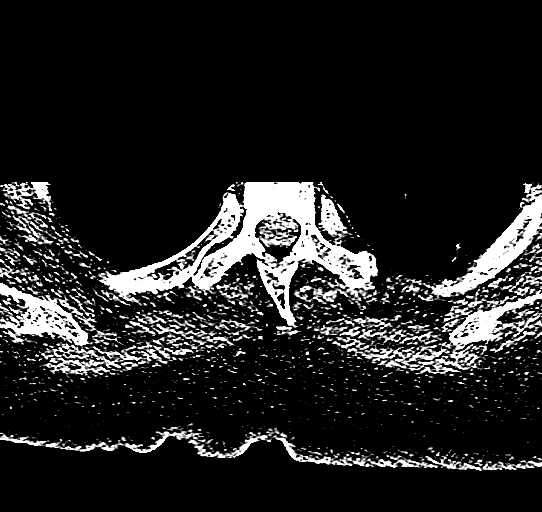
[im 32/112  brain]
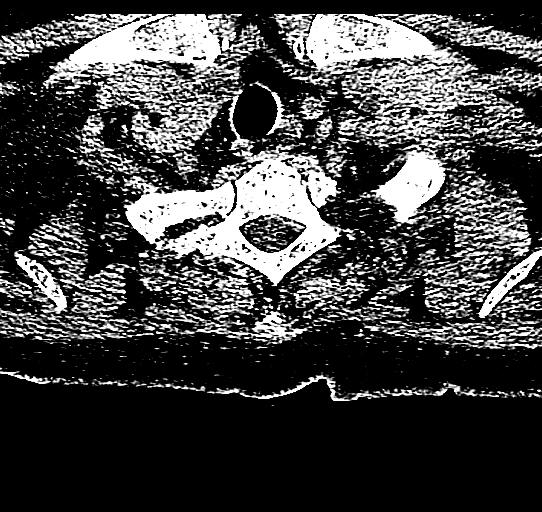
[im 40/112  brain]
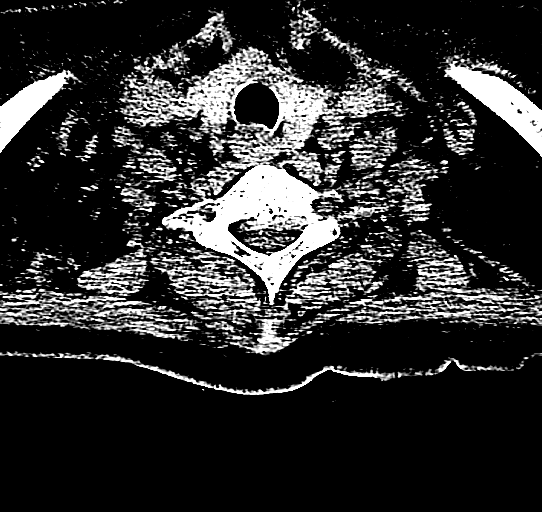
[im 48/112  brain]
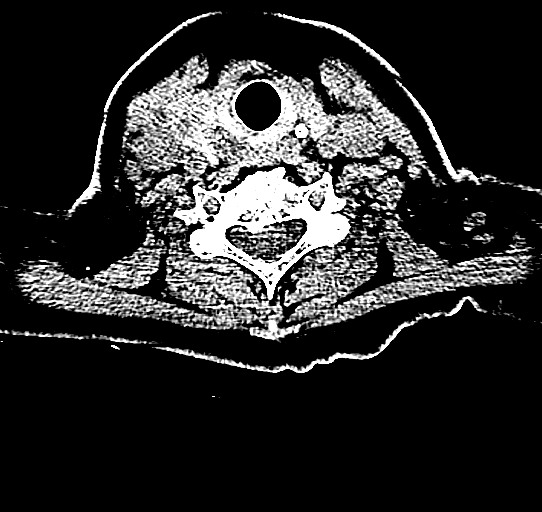
[im 48/112  bone]
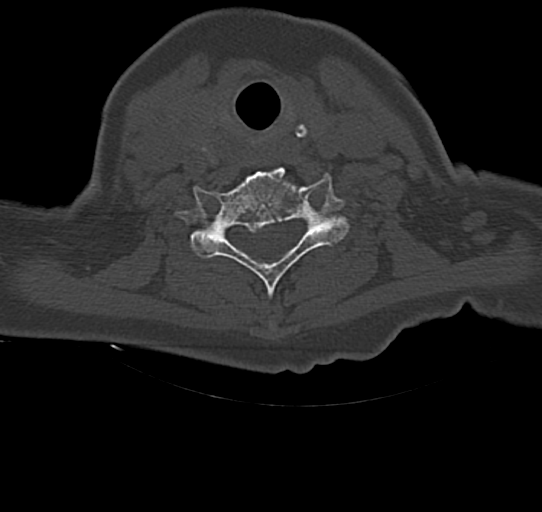
[im 64/112  brain]
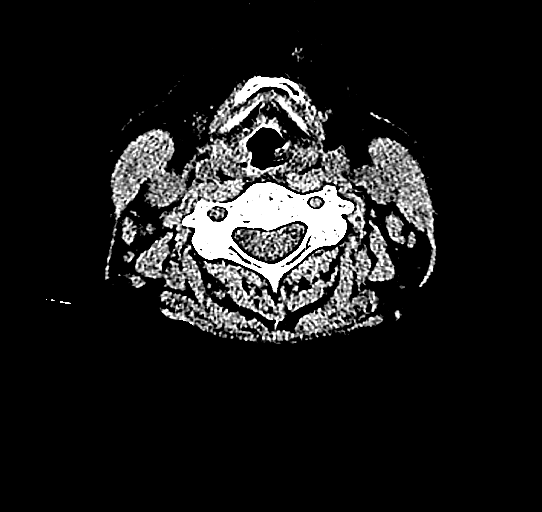
[im 72/112  brain]
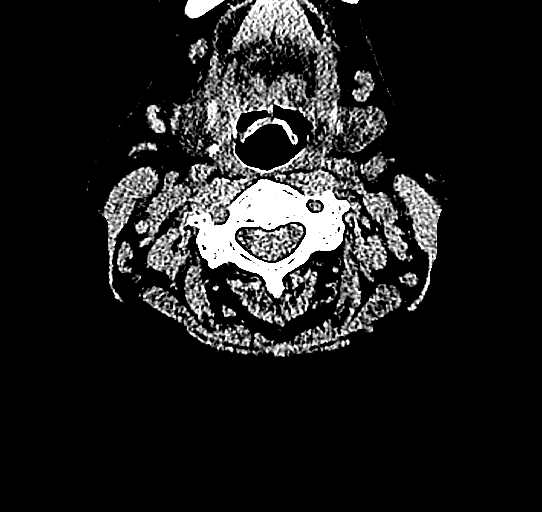
[im 80/112  brain]
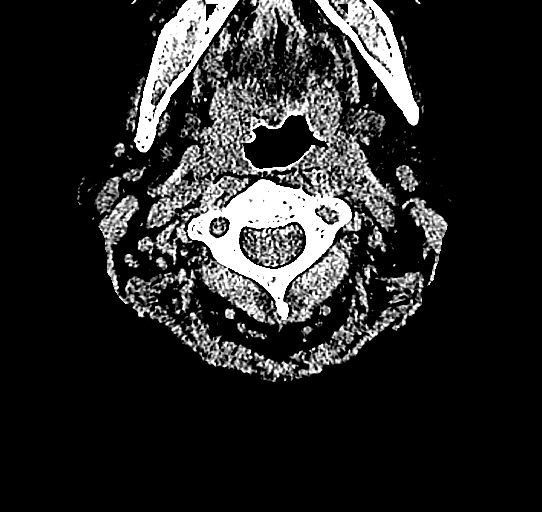
[im 96/112  brain]
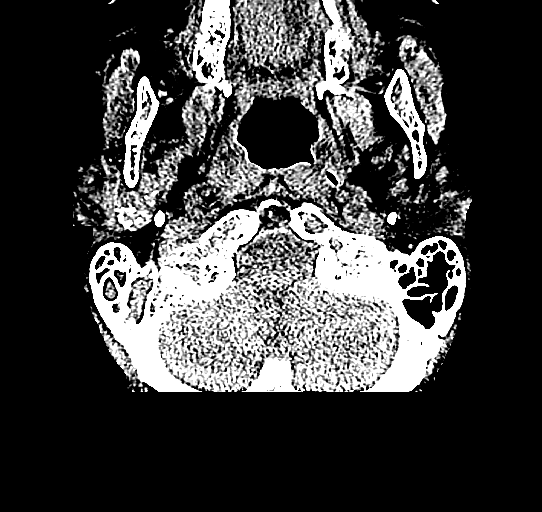
[im 96/112  bone]
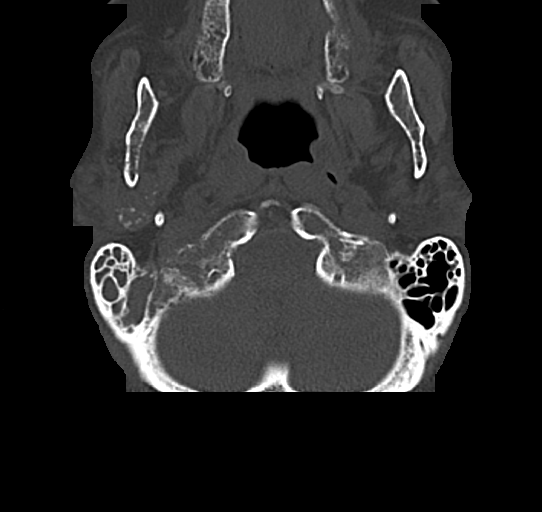
[im 104/112  brain]
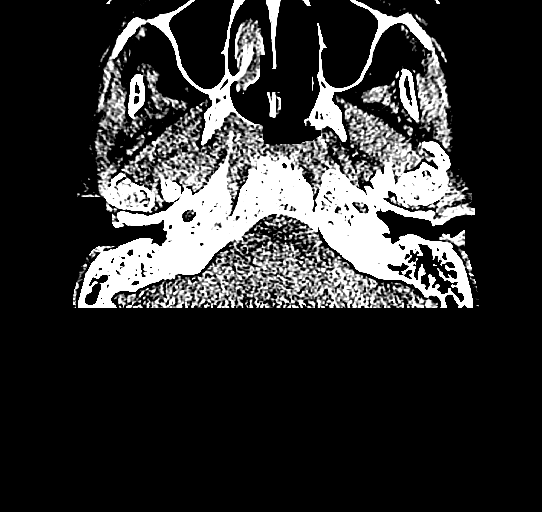

[Series 10: head 3.0 mpr cor · coronal · 0.31mm/px · 3 of 70 slices shown]
[im 14/70  brain]
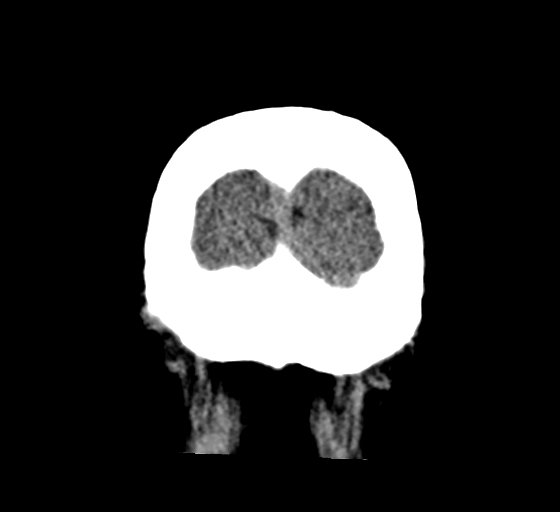
[im 28/70  brain]
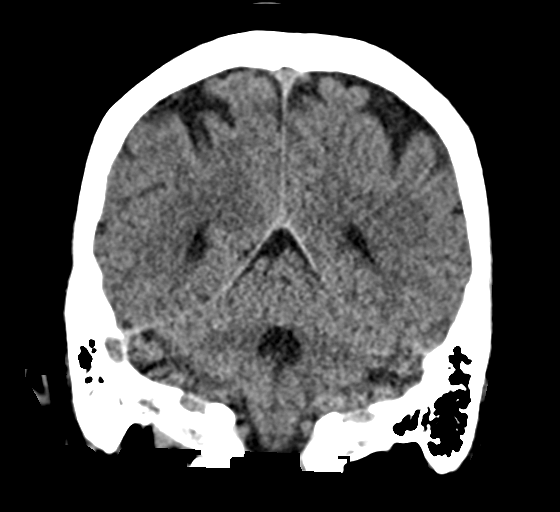
[im 42/70  brain]
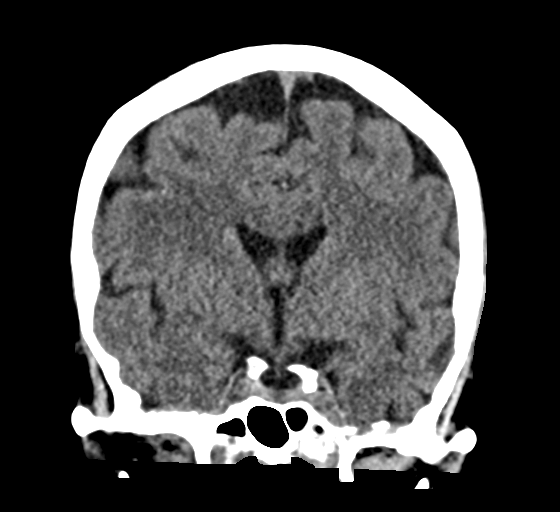

[15 of 47 positions shown; findings below may reference images not displayed]

FINDINGS: CT HEAD FINDINGS

Brain: No acute intracranial abnormality. Specifically, no
hemorrhage, hydrocephalus, mass lesion, acute infarction, or
significant intracranial injury.

Vascular: No hyperdense vessel or unexpected calcification.

Skull: No acute calvarial abnormality.

Sinuses/Orbits: Visualized paranasal sinuses and mastoids clear.
Orbital soft tissues unremarkable.

Other: None

CT CERVICAL SPINE FINDINGS

Alignment: Normal

Skull base and vertebrae: No acute fracture. No primary bone lesion
or focal pathologic process.

Soft tissues and spinal canal: No prevertebral fluid or swelling. No
visible canal hematoma.

Disc levels:  Disc space narrowing and spurring at C5-6 and C6-7.

Upper chest: Negative

Other: None
IMPRESSION: No acute intracranial abnormality.

No acute bony abnormality in the cervical spine.

## 2021-05-16 IMAGING — CR DG HIP (WITH OR WITHOUT PELVIS) 2-3V RIGHT
3 series · 3 of 3 positions shown · non-contrast
Comparison: 11/06/2018.

CLINICAL DATA: Fall, landing on right hip.  Soreness.

EXAM:
DG HIP (WITH OR WITHOUT PELVIS) 2-3V RIGHT

[pelvis ap]
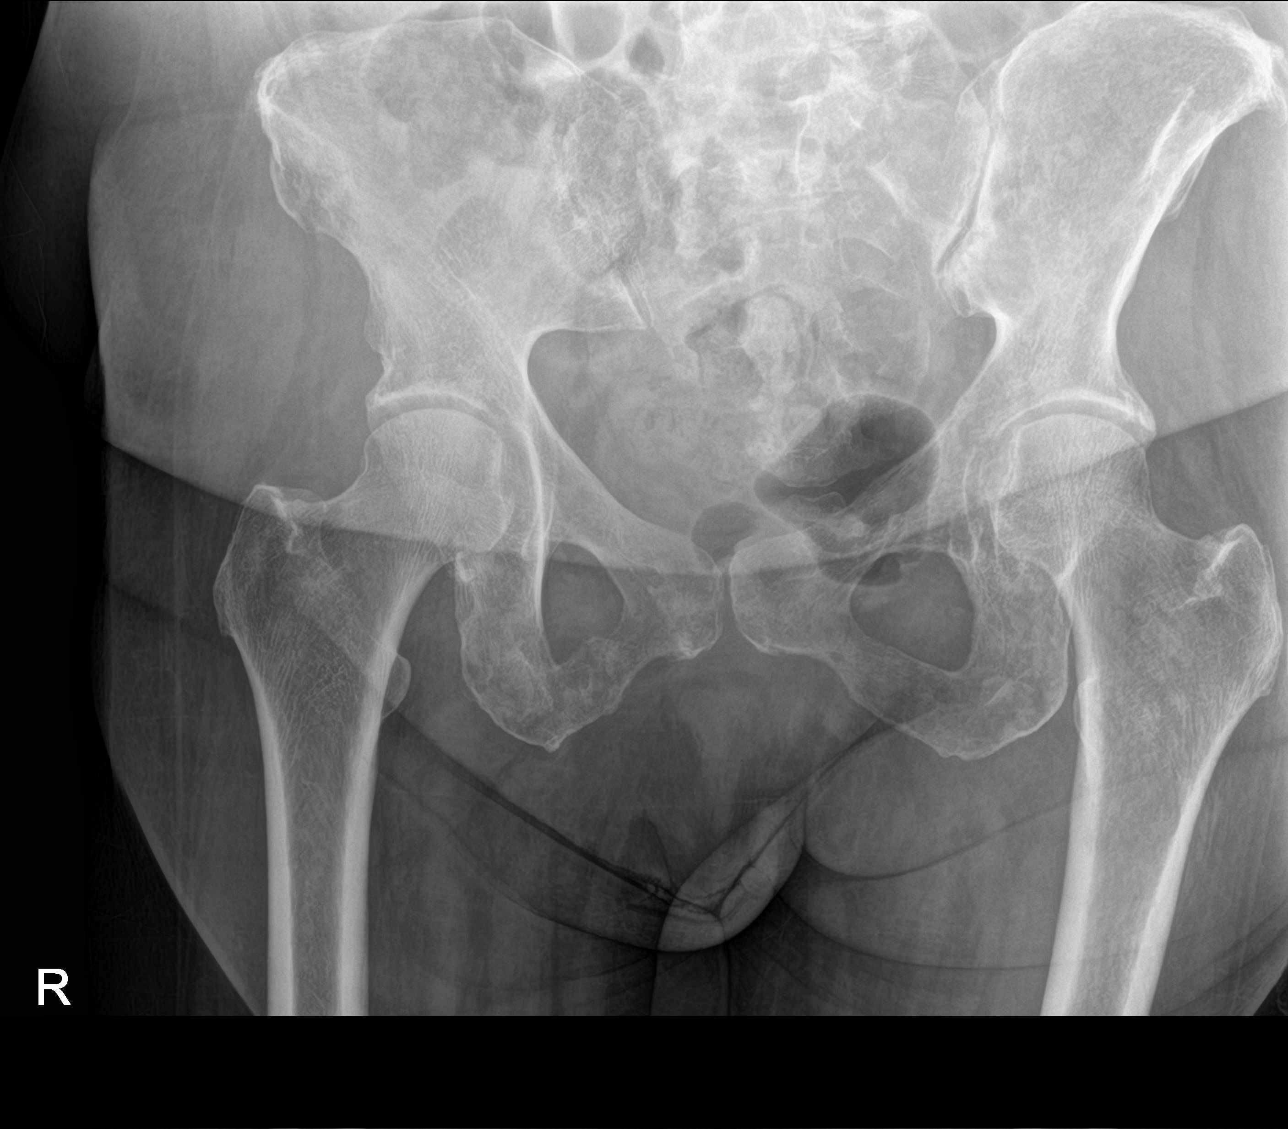

[hip ap]
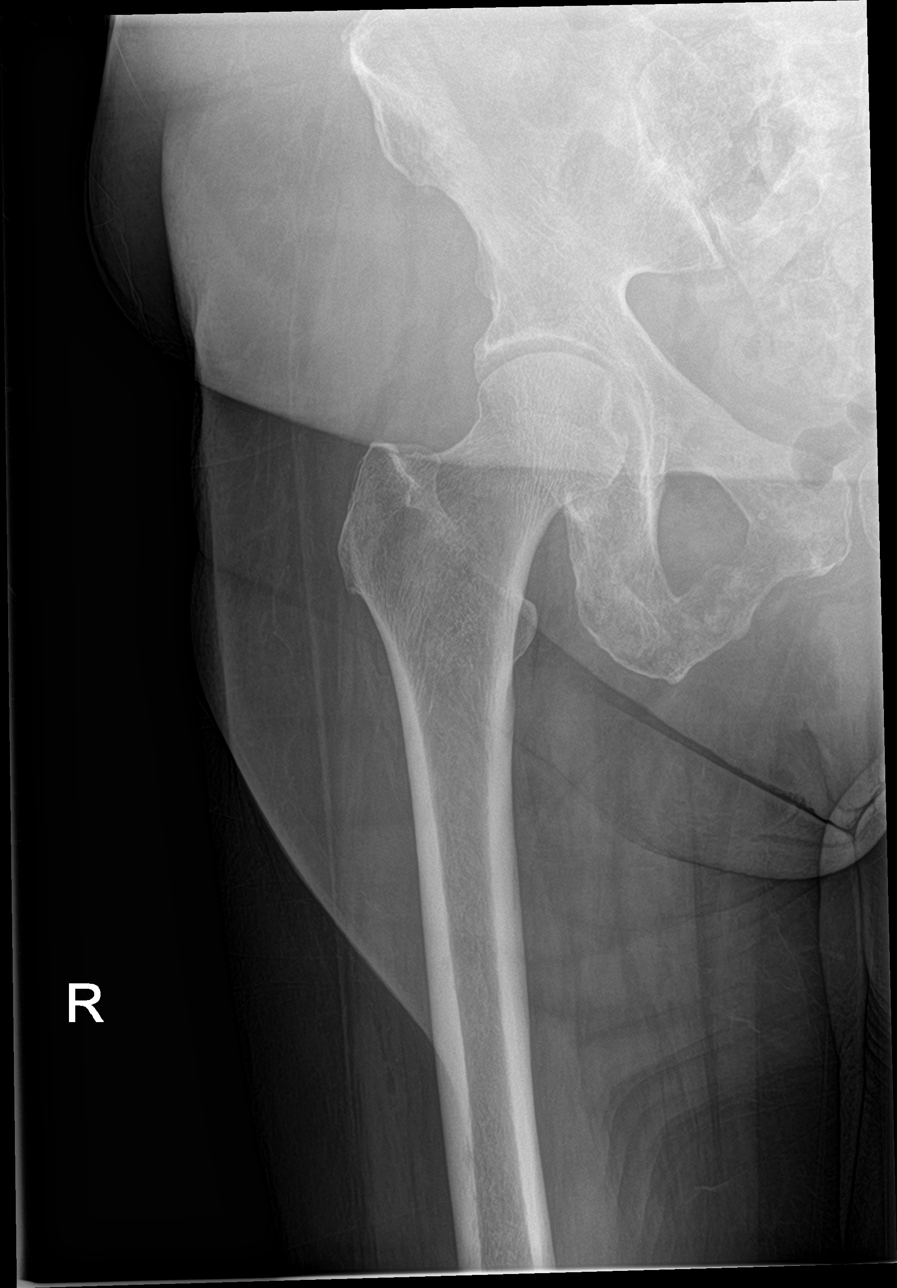

[hip lat]
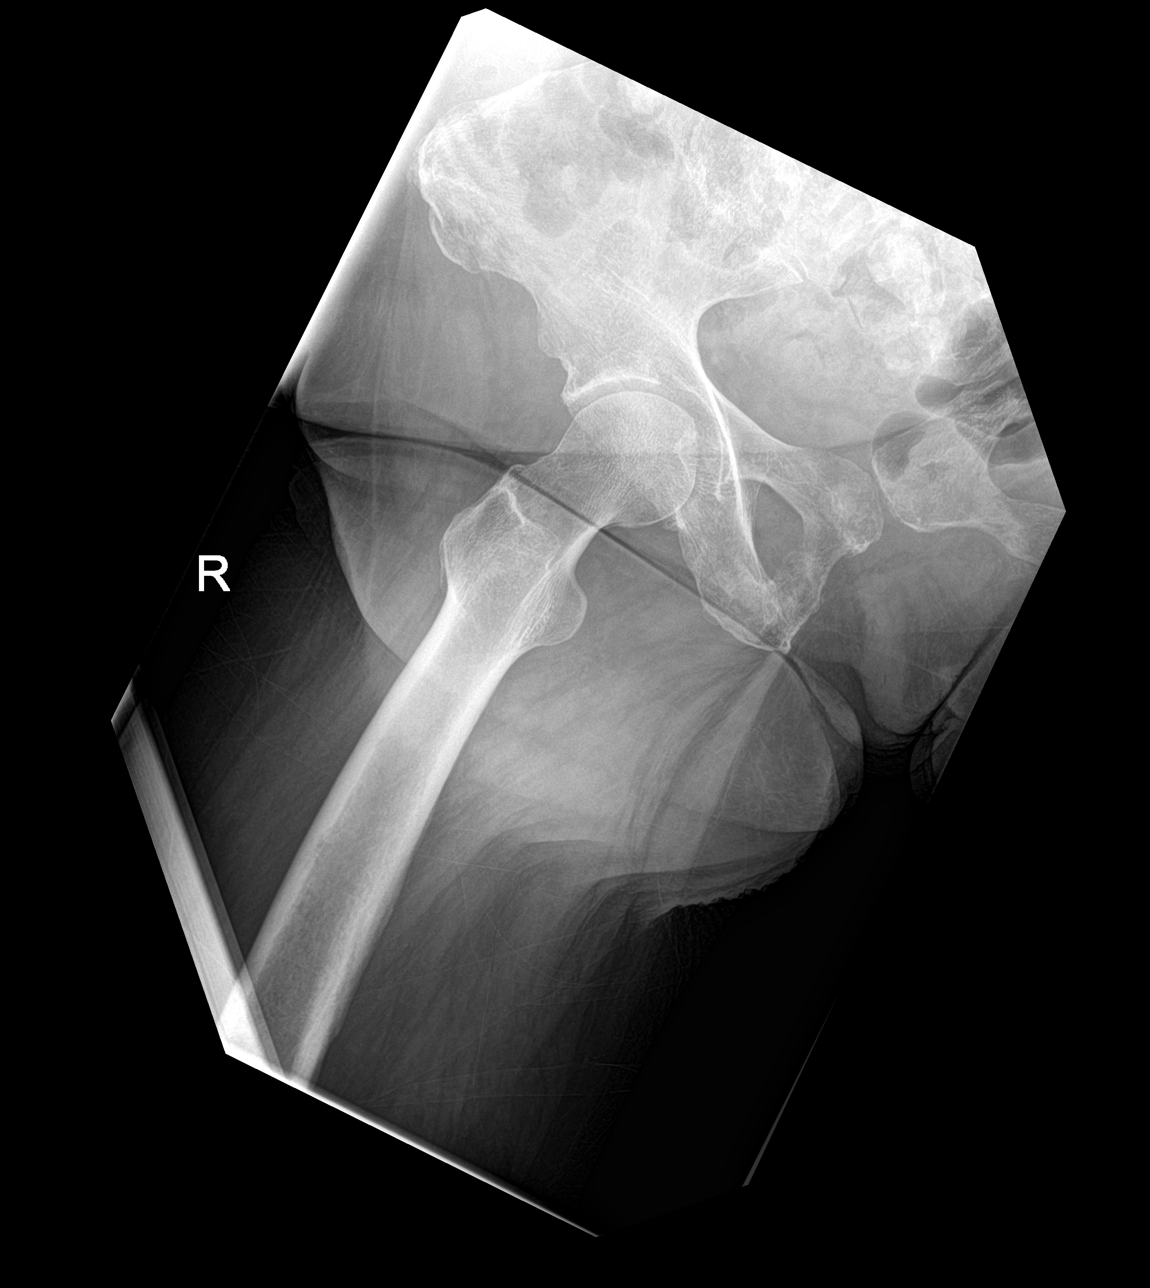

[3 of 3 positions shown; findings below may reference images not displayed]

FINDINGS: Diffuse osteopenia. Deformity noted the right inferior pubic ramus
consistent with fracture, age undetermined. No interim change from
11/06/2018. Diffuse osteopenia. Faint lucencies noted about the bony
pelvis, most prominent about the right ischium. This may be
secondary to osteopenia. Infiltrative process including myeloma or
metastatic disease cannot be completely excluded as previously
mentioned on prior study of 11/06/2018. No other focal bony
abnormality is identified. Right hip is intact.
IMPRESSION: 1. Deformity of the right inferior pubic ramus again noted
consistent with fracture, age undetermined. No change noted from
11/06/2018. Right hip is intact. No evidence of right hip fracture
or dislocation.

2. Diffuse osteopenia. Faint lucencies noted about the bony pelvis
most prominent about the right ischium. These may be secondary to
the osteopenia present. Infiltrate process including myeloma or
metastatic disease cannot be completely excluded as previously
mentioned on prior study of 11/06/2018.
